# Patient Record
Sex: Male | Born: 1960
Health system: Southern US, Community
[De-identification: ages and names within clinical notes are randomized; demographics above are authoritative.]

## PROBLEM LIST (undated history)

## (undated) DIAGNOSIS — I10 Essential (primary) hypertension: Secondary | ICD-10-CM

## (undated) DIAGNOSIS — E119 Type 2 diabetes mellitus without complications: Secondary | ICD-10-CM

## (undated) DIAGNOSIS — E785 Hyperlipidemia, unspecified: Secondary | ICD-10-CM

## (undated) HISTORY — DX: Type 2 diabetes mellitus without complications: E11.9

## (undated) HISTORY — DX: Essential (primary) hypertension: I10

## (undated) HISTORY — DX: Hyperlipidemia, unspecified: E78.5

## (undated) HISTORY — PX: CIRCUMCISION: SUR203

---

## 2011-07-29 ENCOUNTER — Ambulatory Visit: Payer: Self-pay | Admitting: Physician Assistant

## 2011-07-29 VITALS — BP 144/80 | HR 69 | Temp 97.9°F | Resp 16 | Ht 62.0 in | Wt 153.0 lb

## 2011-07-29 DIAGNOSIS — E119 Type 2 diabetes mellitus without complications: Secondary | ICD-10-CM

## 2011-07-29 DIAGNOSIS — H66019 Acute suppurative otitis media with spontaneous rupture of ear drum, unspecified ear: Secondary | ICD-10-CM

## 2011-07-29 LAB — GLUCOSE, POCT (MANUAL RESULT ENTRY): POC Glucose: 101

## 2011-07-29 MED ORDER — OFLOXACIN 0.3 % OT SOLN: 10.0000 [drp] | Freq: Every day | OTIC | Status: AC

## 2011-07-29 MED ORDER — AMOXICILLIN 500 MG PO CAPS: 500.0000 mg | ORAL_CAPSULE | Freq: Three times a day (TID) | ORAL | Status: AC

## 2011-07-29 NOTE — Progress Notes (Signed)
  Subjective:    Patient ID: Alexander Parker, male    DOB: Aug 05, 1960, 51 y.o.   MRN: [id number]  HPI  51 yo hispanic male c/o blood coming out of the Left ear.  Started out with mild pain after flying in January.  Pain has been on and off til yesterday.  No URI s/sx.  Compliant with diabetes meds.  Review of Systems  All other systems reviewed and are negative.       Objective:   Physical Exam  Constitutional: He is oriented to person, place, and time. He appears well-developed and well-nourished.  HENT:  Head: Normocephalic and atraumatic.  Right Ear: External ear normal.  Mouth/Throat: Oropharynx is clear and moist. No oropharyngeal exudate.       L TM with rupture and erythema, dried blood in canal  Neck: Normal range of motion.  Cardiovascular: Normal rate, regular rhythm and normal heart sounds.   Pulmonary/Chest: Effort normal and breath sounds normal.  Neurological: He is alert and oriented to person, place, and time.  Skin: Skin is warm and dry.    Results for orders placed in visit on 07/29/11  GLUCOSE, POCT (MANUAL RESULT ENTRY)      Component Value Range   POC Glucose 101           Assessment & Plan:  L AOM with TM perforation.  Refer to ENT.  TM may need patching.  Start Floxin Otic and Amoxicillin.  Diabetes-controlled.  Continue current meds

## 2011-07-31 ENCOUNTER — Telehealth: Payer: Self-pay

## 2011-07-31 NOTE — Telephone Encounter (Signed)
WAS GIVEN AMOXICILLIAN AND IT IS MAKING HIM VOMIT PLEASE CALL

## 2011-08-01 NOTE — Telephone Encounter (Signed)
Spoke with patient, he states that the Amox is making him throw up.  Patient is taking med on a full stomach.  Can we recommend another rx to help?  Pls advise.

## 2011-08-01 NOTE — Telephone Encounter (Signed)
Called patient and spoke to him and an interpreter.  He is feeling better but vomited yesterday, also has some dizziness.  Language barrier present.  Advised to continue Floxin drops and stop Amoxicillin if he cannot tolerate it BID.  Pt understands.  Will RTC if dizziness persists another 2-3 days.

## 2011-08-02 ENCOUNTER — Ambulatory Visit: Payer: Self-pay | Admitting: Physician Assistant

## 2011-08-02 VITALS — BP 122/77 | HR 66 | Temp 97.7°F | Resp 18 | Ht 63.0 in | Wt 155.0 lb

## 2011-08-02 DIAGNOSIS — H66009 Acute suppurative otitis media without spontaneous rupture of ear drum, unspecified ear: Secondary | ICD-10-CM

## 2011-08-02 DIAGNOSIS — H66019 Acute suppurative otitis media with spontaneous rupture of ear drum, unspecified ear: Secondary | ICD-10-CM

## 2011-08-02 MED ORDER — AZITHROMYCIN 250 MG PO TABS: ORAL_TABLET | ORAL | Status: AC

## 2011-08-02 NOTE — Progress Notes (Signed)
  Subjective:    Patient ID: Markese Bloxham, male    DOB: 09-02-1960, 51 y.o.   MRN: [id number]  HPI  See previous OV   Mr. Brallier is here for recheck left TM perf/OM. He states he took 2 or 3 doses of Amoxicillin and noticed that his tongue only turned white and vomited 1 time only.  Never with a rash, itching or swelling. Does not recall ever taking amoxicillin in the past. He states his ear is improving a little.  He is using Floxin otic still. No hearing loss   Review of Systems  Constitutional: Negative for fever and chills.  HENT: Positive for ear pain and ear discharge (blood). Negative for hearing loss and tinnitus.   Gastrointestinal: Positive for nausea and vomiting.  Skin: Negative for rash.  Neurological: Positive for headaches.       Objective:   Physical Exam  HENT:  Right Ear: Hearing, tympanic membrane, external ear and ear canal normal.  Left Ear: There is drainage (dried blood in canal). No swelling or tenderness.  No middle ear effusion. No hemotympanum. Decreased hearing is noted.  Mouth/Throat: Uvula is midline and oropharynx is clear and moist. No uvula swelling.  Skin: Skin is warm. No rash noted.          Assessment & Plan:  OM with perf per last ov although seems that TM is better Nausea/vomiting from medicaiton Tongue changes  D/C Amoxicillin Change to Zpack Continue Floxin Otic Has ENT referral pending

## 2011-12-23 ENCOUNTER — Ambulatory Visit: Payer: Self-pay | Admitting: Internal Medicine

## 2011-12-23 VITALS — BP 107/69 | HR 65 | Temp 98.0°F | Resp 14 | Ht 63.0 in | Wt 156.2 lb

## 2011-12-23 DIAGNOSIS — E119 Type 2 diabetes mellitus without complications: Secondary | ICD-10-CM

## 2011-12-23 DIAGNOSIS — E785 Hyperlipidemia, unspecified: Secondary | ICD-10-CM

## 2011-12-23 DIAGNOSIS — I10 Essential (primary) hypertension: Secondary | ICD-10-CM | POA: Insufficient documentation

## 2011-12-23 LAB — COMPREHENSIVE METABOLIC PANEL
AST: 14 U/L (ref 0–37)
Albumin: 4.2 g/dL (ref 3.5–5.2)
Alkaline Phosphatase: 62 U/L (ref 39–117)
BUN: 14 mg/dL (ref 6–23)
Calcium: 9.6 mg/dL (ref 8.4–10.5)
Chloride: 100 mEq/L (ref 96–112)
Glucose, Bld: 133 mg/dL — ABNORMAL HIGH (ref 70–99)
Potassium: 4.5 mEq/L (ref 3.5–5.3)
Sodium: 135 mEq/L (ref 135–145)
Total Protein: 6.7 g/dL (ref 6.0–8.3)

## 2011-12-23 LAB — LIPID PANEL: Cholesterol: 211 mg/dL — ABNORMAL HIGH (ref 0–200)

## 2011-12-23 LAB — POCT GLYCOSYLATED HEMOGLOBIN (HGB A1C): Hemoglobin A1C: 6.8

## 2011-12-23 MED ORDER — GLIPIZIDE-METFORMIN HCL 2.5-500 MG PO TABS: 1.0000 | ORAL_TABLET | Freq: Two times a day (BID) | ORAL | Status: DC

## 2011-12-23 MED ORDER — LISINOPRIL-HYDROCHLOROTHIAZIDE 10-12.5 MG PO TABS: 1.0000 | ORAL_TABLET | Freq: Every day | ORAL | Status: DC

## 2011-12-23 NOTE — Progress Notes (Addendum)
  Subjective:    Patient ID: Alexander Parker, male    DOB: [birth year], 51 y.o.   MRN: [id number]  HPI Here for followup of diabetes and hypertension Medication was changed 4 months ago because of elevated hemoglobin A1c He has no complaints   Review of Systems No fatigue or weight loss No fever or night sweats No chest pain/shortness of breath No peripheral numbness    Objective:   Physical Exam Vital signs normal except weight HEENT clear Heart regular without murmur Lower extremities with no numbness or edema       Results for orders placed in visit on 12/23/11  POCT GLYCOSYLATED HEMOGLOBIN (HGB A1C)      Component Value Range   Hemoglobin A1C 6.8      Assessment & Plan:  Problem #1 diabetes Problem #2 hypertension Problem #3 overweight At labs in 2010 was hyperlipidemic and we'll recheck this today Meds ordered this encounter  Medications   LOSE weight             . glipiZIDE-metformin (METAGLIP) 2.5-500 MG per tablet    Sig: Take 1 tablet by mouth 2 (two) times daily before a meal.    Dispense:  180 tablet    Refill:  1  . lisinopril-hydrochlorothiazide (PRINZIDE,ZESTORETIC) 10-12.5 MG per tablet    Sig: Take 1 tablet by mouth daily.    Dispense:  90 tablet    Refill:  1   Addendum 12/24/2011: LDL in the 140s Start Pravachol 20/check labs 3 months

## 2011-12-24 ENCOUNTER — Encounter: Payer: Self-pay | Admitting: Internal Medicine

## 2011-12-24 MED ORDER — PRAVASTATIN SODIUM 20 MG PO TABS: 20.0000 mg | ORAL_TABLET | Freq: Every day | ORAL | Status: DC

## 2011-12-24 NOTE — Addendum Note (Signed)
Addended by: Tonye Pearson on: 12/24/2011 11:01 PM   Modules accepted: Orders

## 2012-09-03 ENCOUNTER — Ambulatory Visit: Payer: Self-pay | Admitting: Internal Medicine

## 2012-09-03 VITALS — BP 114/68 | HR 88 | Temp 98.4°F | Resp 16 | Ht 62.0 in | Wt 151.8 lb

## 2012-09-03 DIAGNOSIS — E1165 Type 2 diabetes mellitus with hyperglycemia: Secondary | ICD-10-CM

## 2012-09-03 DIAGNOSIS — Z760 Encounter for issue of repeat prescription: Secondary | ICD-10-CM

## 2012-09-03 DIAGNOSIS — J029 Acute pharyngitis, unspecified: Secondary | ICD-10-CM

## 2012-09-03 DIAGNOSIS — E785 Hyperlipidemia, unspecified: Secondary | ICD-10-CM

## 2012-09-03 DIAGNOSIS — E118 Type 2 diabetes mellitus with unspecified complications: Secondary | ICD-10-CM

## 2012-09-03 LAB — POCT CBC
Granulocyte percent: 69.5 %G (ref 37–80)
HCT, POC: 49.8 % (ref 43.5–53.7)
MCH, POC: 28.8 pg (ref 27–31.2)
MCV: 89.1 fL (ref 80–97)
RBC: 5.59 M/uL (ref 4.69–6.13)
WBC: 6.8 10*3/uL (ref 4.6–10.2)

## 2012-09-03 LAB — POCT GLYCOSYLATED HEMOGLOBIN (HGB A1C): Hemoglobin A1C: 7.9

## 2012-09-03 MED ORDER — PRAVASTATIN SODIUM 20 MG PO TABS: 20.0000 mg | ORAL_TABLET | Freq: Every day | ORAL | Status: DC

## 2012-09-03 MED ORDER — GLIPIZIDE-METFORMIN HCL 2.5-500 MG PO TABS: 1.0000 | ORAL_TABLET | Freq: Two times a day (BID) | ORAL | Status: DC

## 2012-09-03 NOTE — Patient Instructions (Signed)
Take your meds as directed. Follow the diabetic diet and exercise daily. Your diabetes is not well controlled and your glucose is 288 and your hgb a1c is 7.9. This need to come down to 87 and your sugar needs to be down to 120. Return to the office at 104 make an appointment for in one month to check on your diabetes control.Marland Kitchenany problems return to the office.

## 2012-09-03 NOTE — Progress Notes (Signed)
  Subjective:    Patient ID: Alexander Parker, male    DOB: [birth year], 52 y.o.   MRN: [id number]  HPI Pt ran out of his diabetes meds 4 days ago. Feels tired. Is waking up 2 times nightly to urinate. Also is complaining of a sore throat for the past month. Taking advil for pain. No stiff neck no rash no headache.no weight lossor weight gain.  Review of Systems  Constitutional: Positive for fatigue.  Eyes: Negative.   Respiratory: Negative.   Cardiovascular: Negative.   Gastrointestinal: Negative.   Endocrine: Positive for polyuria.  Genitourinary: Positive for dysuria.  Allergic/Immunologic: Negative.  Negative for immunocompromised state.  Neurological: Negative.   Hematological: Negative.   Psychiatric/Behavioral: Negative.   All other systems reviewed and are negative.       Objective:   Physical Exam  Nursing note and vitals reviewed. Constitutional: He is oriented to person, place, and time. He appears well-developed and well-nourished.  HENT:  Head: Normocephalic and atraumatic.  Right Ear: External ear normal.  Left Ear: External ear normal.  Nose: Nose normal.  Mouth/Throat: Oropharynx is clear and moist.  Eyes: Conjunctivae and EOM are normal. Pupils are equal, round, and reactive to light.  Neck: Neck supple.  Cardiovascular: Normal rate, regular rhythm, normal heart sounds and intact distal pulses.   Pulmonary/Chest: Effort normal and breath sounds normal.  Abdominal: Soft. Bowel sounds are normal.  Musculoskeletal: Normal range of motion.  Neurological: He is alert and oriented to person, place, and time. He has normal reflexes.  Skin: Skin is warm and dry.  Psychiatric: He has a normal mood and affect. His behavior is normal. Judgment and thought content normal.    Results for orders placed in visit on 09/03/12  POCT CBC      Result Value Range   WBC 6.8  4.6 - 10.2 K/uL   Lymph, poc 1.6  0.6 - 3.4   POC LYMPH PERCENT 23.7  10 - 50 %L   MID (cbc) 0.5  0 -  0.9   POC MID % 6.8  0 - 12 %M   POC Granulocyte 4.7  2 - 6.9   Granulocyte percent 69.5  37 - 80 %G   RBC 5.59  4.69 - 6.13 M/uL   Hemoglobin 16.1  14.1 - 18.1 g/dL   HCT, POC 56.2  13.0 - 53.7 %   MCV 89.1  80 - 97 fL   MCH, POC 28.8  27 - 31.2 pg   MCHC 32.3  31.8 - 35.4 g/dL   RDW, POC 86.5     Platelet Count, POC 332  142 - 424 K/uL   MPV 10.1  0 - 99.8 fL  GLUCOSE, POCT (MANUAL RESULT ENTRY)      Result Value Range   POC Glucose 288 (*) 70 - 99 mg/dl  POCT GLYCOSYLATED HEMOGLOBIN (HGB A1C)      Result Value Range   Hemoglobin A1C 7.9          Assessment & Plan:  Hemoglobin a1c is high at 7.9 and glucose is high 288. Pt has not been taking his medication for several days. Will encourage dietary compliance exercise and wiill restat meds at his usual dose.the patient needs to make an appointment to be followed at 104 to  Follow for ppossible meds change and better diabetes control.

## 2013-03-08 ENCOUNTER — Ambulatory Visit: Payer: Self-pay | Admitting: Emergency Medicine

## 2013-03-08 VITALS — BP 112/68 | HR 73 | Temp 98.2°F | Resp 16 | Ht 62.0 in | Wt 155.0 lb

## 2013-03-08 DIAGNOSIS — E119 Type 2 diabetes mellitus without complications: Secondary | ICD-10-CM

## 2013-03-08 DIAGNOSIS — E785 Hyperlipidemia, unspecified: Secondary | ICD-10-CM

## 2013-03-08 DIAGNOSIS — I10 Essential (primary) hypertension: Secondary | ICD-10-CM

## 2013-03-08 LAB — COMPREHENSIVE METABOLIC PANEL
ALT: 21 U/L (ref 0–53)
AST: 18 U/L (ref 0–37)
Albumin: 4.1 g/dL (ref 3.5–5.2)
Alkaline Phosphatase: 53 U/L (ref 39–117)
Chloride: 99 mEq/L (ref 96–112)
Potassium: 4.5 mEq/L (ref 3.5–5.3)
Sodium: 135 mEq/L (ref 135–145)
Total Protein: 6.9 g/dL (ref 6.0–8.3)

## 2013-03-08 LAB — LIPID PANEL
Cholesterol: 166 mg/dL (ref 0–200)
HDL: 37 mg/dL — ABNORMAL LOW (ref >39)
Total CHOL/HDL Ratio: 4.5 Ratio
VLDL: 20 mg/dL (ref 0–40)

## 2013-03-08 LAB — POCT GLYCOSYLATED HEMOGLOBIN (HGB A1C): Hemoglobin A1C: 6.1

## 2013-03-08 LAB — GLUCOSE, POCT (MANUAL RESULT ENTRY): POC Glucose: 119 mg/dl — AB (ref 70–99)

## 2013-03-08 MED ORDER — LISINOPRIL-HYDROCHLOROTHIAZIDE 10-12.5 MG PO TABS: 1.0000 | ORAL_TABLET | Freq: Every day | ORAL | Status: DC

## 2013-03-08 MED ORDER — GLIPIZIDE-METFORMIN HCL 2.5-500 MG PO TABS: 1.0000 | ORAL_TABLET | Freq: Two times a day (BID) | ORAL | Status: DC

## 2013-03-08 MED ORDER — PRAVASTATIN SODIUM 20 MG PO TABS: 20.0000 mg | ORAL_TABLET | Freq: Every day | ORAL | Status: DC

## 2013-03-08 NOTE — Progress Notes (Addendum)
Subjective:  This chart was scribed for Collene Gobble, MD by Arlan Organ, ED Scribe. This patient was seen in room Room  9 and the patient's care was started 10:50 AM.   Patient ID: Alexander Parker, male    DOB: [birth year], 52 y.o.   MRN: [id number]  HPI HPI Comments: Alexander Parker is a 52 y.o. male who presents to [facility] seeking a DM check up today. Pt states he has had DM for 10 years. Pt states he feels he is currently in good health, and does not have any medical complaints. Pt denies being seen by an optometrist for DM progression in his eyes. 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  Review of Systems  Respiratory: Negative.   Cardiovascular: Negative.   Neurological: Negative.     Past Medical History  Diagnosis Date  . Diabetes mellitus without complication     History   Social History  . Marital Status: Married    Spouse Name: N/A    Number of Children: N/A  . Years of Education: N/A   Occupational History  . Not on file.   Social History Main Topics  . Smoking status: Never Smoker   . Smokeless tobacco: Not on file  . Alcohol Use: Not on file  . Drug Use: Not on file  . Sexual Activity: Not on file   Other Topics Concern  . Not on file   Social History Narrative  . No narrative on file    History reviewed. No pertinent past surgical history.   Results for orders placed in visit on 09/03/12  POCT CBC      Result Value Range   WBC 6.8  4.6 - 10.2 K/uL   Lymph, poc 1.6  0.6 - 3.4   POC LYMPH PERCENT 23.7  10 - 50 %L   MID (cbc) 0.5  0 - 0.9   POC MID % 6.8  0 - 12 %M   POC Granulocyte 4.7  2 - 6.9   Granulocyte percent 69.5  37 - 80 %G   RBC 5.59  4.69 - 6.13 M/uL   Hemoglobin 16.1  14.1 - 18.1 g/dL   HCT, POC 40.9  81.1 - 53.7 %   MCV 89.1  80 - 97 fL   MCH, POC 28.8  27 - 31.2 pg   MCHC 32.3  31.8 - 35.4 g/dL   RDW, POC 91.4     Platelet Count, POC 332  142 - 424 K/uL   MPV 10.1  0 - 99.8 fL  GLUCOSE, POCT (MANUAL RESULT ENTRY)      Result Value Range   POC  Glucose 288 (*) 70 - 99 mg/dl  POCT GLYCOSYLATED HEMOGLOBIN (HGB A1C)      Result Value Range   Hemoglobin A1C 7.9         Objective:   Physical Exam  Constitutional: He is oriented to person, place, and time. He appears well-developed and well-nourished. No distress.  HENT:  Head: Normocephalic.  Eyes: Conjunctivae are normal. Pupils are equal, round, and reactive to light. No scleral icterus.  Neck: Normal range of motion. Neck supple. No thyromegaly present.  Cardiovascular: Normal rate and regular rhythm.  Exam reveals no gallop and no friction rub.   No murmur heard. Pulmonary/Chest: Effort normal and breath sounds normal. No respiratory distress. He has no wheezes. He has no rales.  Abdominal: Soft. Bowel sounds are normal. He exhibits no distension. There is no tenderness. There is no rebound.  Musculoskeletal:  Normal range of motion.  Neurological: He is alert and oriented to person, place, and time.  Skin: Skin is warm and dry. No rash noted.  Psychiatric: He has a normal mood and affect. His behavior is normal.   Results for orders placed in visit on 03/08/13  GLUCOSE, POCT (MANUAL RESULT ENTRY)      Result Value Range   POC Glucose 119 (*) 70 - 99 mg/dl  POCT GLYCOSYLATED HEMOGLOBIN (HGB A1C)      Result Value Range   Hemoglobin A1C 6.1           Assessment & Plan:  Patient showing excellent blood sugar control. His blood pressure is at goal. He had a normal filament test. He he was advised to see his eye doctor for routine check

## 2013-03-08 NOTE — Patient Instructions (Signed)
Please return to clinic for a reevaluation in 4 months. Please make an appointment to see your eye doctor. No change in your medications at the present time.

## 2013-09-25 ENCOUNTER — Ambulatory Visit (INDEPENDENT_AMBULATORY_CARE_PROVIDER_SITE_OTHER): Payer: BC Managed Care – PPO | Admitting: Emergency Medicine

## 2013-09-25 VITALS — BP 106/70 | HR 73 | Temp 97.9°F | Resp 16 | Ht 61.5 in | Wt 155.0 lb

## 2013-09-25 DIAGNOSIS — L255 Unspecified contact dermatitis due to plants, except food: Secondary | ICD-10-CM

## 2013-09-25 MED ORDER — TRIAMCINOLONE ACETONIDE 0.1 % EX CREA: 1.0000 "application " | TOPICAL_CREAM | Freq: Two times a day (BID) | CUTANEOUS | Status: DC

## 2013-09-25 MED ORDER — METHYLPREDNISOLONE ACETATE 80 MG/ML IJ SUSP
120.0000 mg | Freq: Once | INTRAMUSCULAR | Status: AC
  Administered 2013-09-25: 120 mg via INTRAMUSCULAR

## 2013-09-25 NOTE — Progress Notes (Signed)
[facility] [address] [place] [address] [phone]  Date:  09/25/2013   Name:  Alexander Parker   DOB:  October 29, 1960   MRN:  [id number]  PCP:  Kennon Portela, MD    Chief Complaint: Poison Ivy   History of Present Illness:  Alexander Parker is a 53 y.o. 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  Has rash on both forearms flexor surfaces.  No other areas.  No improvement with over the counter medications or other home remedies. Denies other complaint or health concern today.   Patient Active Problem List   Diagnosis Date Noted  . DM (diabetes mellitus) 12/23/2011  . HTN (hypertension) 12/23/2011  . Diabetes mellitus 07/29/2011    Past Medical History  Diagnosis Date  . Diabetes mellitus without complication     History reviewed. No pertinent past surgical history.  History  Substance Use Topics  . Smoking status: Never Smoker   . Smokeless tobacco: Not on file  . Alcohol Use: Not on file    Family History  Problem Relation Age of Onset  . Cancer Mother   . Diabetes Father     No Known Allergies  Medication list has been reviewed and updated.  Current Outpatient Prescriptions on File Prior to Visit  Medication Sig Dispense Refill  . glipiZIDE-metformin (METAGLIP) 2.5-500 MG per tablet Take 1 tablet by mouth 2 (two) times daily before a meal.  180 tablet  3  . lisinopril-hydrochlorothiazide (PRINZIDE,ZESTORETIC) 10-12.5 MG per tablet Take 1 tablet by mouth daily.  90 tablet  3  . pravastatin (PRAVACHOL) 20 MG tablet Take 1 tablet (20 mg total) by mouth daily.  90 tablet  3   No current facility-administered medications on file prior to visit.    Review of Systems:  As per HPI, otherwise negative.    Physical Examination: Filed Vitals:   09/25/13 1835  BP: 106/70  Pulse: 73  Temp: 97.9 F (36.6 C)  Resp: 16   Filed Vitals:   09/25/13 1835  Height: 5'  1.5" (1.562 m)  Weight: 155 lb (70.308 kg)   Body mass index is 28.82 kg/(m^2). Ideal Body Weight: Weight in (lb) to have BMI = 25: 134.2   GEN: WDWN, NAD, Non-toxic, Alert & Oriented x 3 HEENT: Atraumatic, Normocephalic.  Ears and Nose: No external deformity. EXTR: No clubbing/cyanosis/edema NEURO: Normal gait.  PSYCH: Normally interactive. Conversant. Not depressed or anxious appearing.  Calm demeanor.  Skin:  Rhus dermatitis arms   Assessment and Plan: Poison ivy TAC Benadryl   Signed,  Ellison Carwin, MD

## 2013-09-25 NOTE — Patient Instructions (Signed)
Hiedra venenosa  (Poison Ivy) Luego de la exposicin previa a la planta. La erupcin suele aparecer 48 horas despus de la exposicin. Suelen ser bultos (ppulas) o ampollas (vesculas) en un patrn lineal. abrirse. Los ojos tambin podran hincharse. Las hinchazn es peor por la maana y mejora a medida que avanza el da. Deben tomarse todas las precauciones para prevenir una infeccin bacteriana (por grmenes) secundaria, que puede ocasionar cicatrices. Mantenga todas las reas abiertas secas, limpias y vendadas y cbralas con un ungento antibacteriano, en caso que lo necesite. Si no aparece una infeccin secundaria, esta dermatitis generalmente se cura dentro de las 2 o 3 semanas sin tratamiento. INSTRUCCIONES PARA EL CUIDADO DOMICILIARIO Lvese cuidadosamente con agua y jabn tan pronto como ocurra la exposicin al txico. Tiene alrededor de media hora para retirar la resina de la planta antes de que le cause el sarpullido. El lavado destruir rpidamente el aceite o antgeno que se encuentra sobre la piel y que podr causar el sarpullido. Lave enrgicamente debajo de las uas. Todo resto de resina seguir diseminando el sarpullido. No se frote la piel vigorosamente cuando lava la zona afectada. La dermatitis no se extender si retira todo el aceite de la planta que haya quedado en su cuerpo. Un sarpullido que se ha transformado en lesiones que supuran (llagas) no diseminar el sarpullido, a menos que no se haya lavado cuidadosamente. Tambin es importante lavar todas las prendas que haya utilizado. Pueden tener alrgenos activos. El sarpullido volver, an varios das ms tarde. La mejor medida es evitar el contacto con la planta en el futuro. La hiedra venenosa puede reconocerse por el nmero de hojas, En general, la hiedra venenosa tiene tres hojas con ramas floridas en un tallo simple. Podr adquirir difenhidramina que es un medicamento de venta libre, y utilizarlo segn lo necesite para aliviar la  picazn. No conduzca automviles si este medicamento le produce somnolencia. Consulte con el profesional que lo asiste acerca de los medicamentos que podr administrarle a los nios. SOLICITE ATENCIN MDICA SI:  Observa reas abiertas.  Enrojecimiento que se extiende ms all de la zona del sarpullido.  Una secrecin purulenta (similar al pus).  Aumento del dolor.  Desarrolla otros signos de infeccin (como fiebre). Document Released: 02/21/2005 Document Revised: 08/06/2011 [employer] Patient Information 2014 [employer].  

## 2013-09-26 NOTE — Addendum Note (Signed)
Addended by: Constance Goltz on: 09/26/2013 07:18 AM   Modules accepted: Orders

## 2014-01-20 ENCOUNTER — Telehealth: Payer: Self-pay | Admitting: *Deleted

## 2014-01-20 NOTE — Telephone Encounter (Signed)
Spoke to patient he will come to the walk in clinic this week for follow up on diabetes.

## 2014-01-23 ENCOUNTER — Ambulatory Visit (INDEPENDENT_AMBULATORY_CARE_PROVIDER_SITE_OTHER): Payer: BC Managed Care – PPO | Admitting: Internal Medicine

## 2014-01-23 VITALS — BP 118/72 | HR 67 | Temp 97.7°F | Resp 14 | Ht 62.0 in | Wt 151.4 lb

## 2014-01-23 DIAGNOSIS — Z7189 Other specified counseling: Secondary | ICD-10-CM

## 2014-01-23 DIAGNOSIS — E1110 Type 2 diabetes mellitus with ketoacidosis without coma: Secondary | ICD-10-CM

## 2014-01-23 DIAGNOSIS — R350 Frequency of micturition: Secondary | ICD-10-CM

## 2014-01-23 DIAGNOSIS — E1310 Other specified diabetes mellitus with ketoacidosis without coma: Secondary | ICD-10-CM

## 2014-01-23 LAB — COMPREHENSIVE METABOLIC PANEL
ALT: 27 U/L (ref 0–53)
AST: 20 U/L (ref 0–37)
Albumin: 4.3 g/dL (ref 3.5–5.2)
Alkaline Phosphatase: 56 U/L (ref 39–117)
BILIRUBIN TOTAL: 0.5 mg/dL (ref 0.2–1.2)
BUN: 17 mg/dL (ref 6–23)
CO2: 32 mEq/L (ref 19–32)
CREATININE: 0.69 mg/dL (ref 0.50–1.35)
Calcium: 9.6 mg/dL (ref 8.4–10.5)
Chloride: 99 mEq/L (ref 96–112)
Glucose, Bld: 135 mg/dL — ABNORMAL HIGH (ref 70–99)
Potassium: 4.5 mEq/L (ref 3.5–5.3)
Sodium: 135 mEq/L (ref 135–145)
Total Protein: 7.1 g/dL (ref 6.0–8.3)

## 2014-01-23 LAB — POCT UA - MICROSCOPIC ONLY
Bacteria, U Microscopic: NEGATIVE
Casts, Ur, LPF, POC: NEGATIVE
Crystals, Ur, HPF, POC: NEGATIVE
Mucus, UA: NEGATIVE
YEAST UA: NEGATIVE

## 2014-01-23 LAB — POCT URINALYSIS DIPSTICK
BILIRUBIN UA: NEGATIVE
Blood, UA: NEGATIVE
Glucose, UA: NEGATIVE
Ketones, UA: NEGATIVE
Leukocytes, UA: NEGATIVE
NITRITE UA: NEGATIVE
PH UA: 5.5
PROTEIN UA: 30
Spec Grav, UA: 1.025
Urobilinogen, UA: 1

## 2014-01-23 LAB — POCT GLYCOSYLATED HEMOGLOBIN (HGB A1C): HEMOGLOBIN A1C: 6.7

## 2014-01-23 LAB — GLUCOSE, POCT (MANUAL RESULT ENTRY): POC Glucose: 133 mg/dl — AB (ref 70–99)

## 2014-01-23 MED ORDER — LISINOPRIL 10 MG PO TABS: 10.0000 mg | ORAL_TABLET | Freq: Every day | ORAL | Status: DC

## 2014-01-23 NOTE — Progress Notes (Signed)
   Subjective:    Patient ID: Alexander Parker, male    DOB: January 09, 1961, 53 y.o.   MRN: [id number]  HPI Speaks only spanish, interpretor our Verdis Frederickson. Here to f/up on T2DM. Taking and tolerating all meds. Does not need refills yet. Does have frequency but only after taking am meds. HCTZ in one of them. BP always well controlled at home.   Review of Systems Needs hispanic provider speaker.    Objective:   Physical Exam  Constitutional: He is oriented to person, place, and time. He appears well-developed and well-nourished. No distress.  HENT:  Head: Normocephalic.  Mouth/Throat: Oropharynx is clear and moist.  Eyes: Conjunctivae and EOM are normal. Pupils are equal, round, and reactive to light. No scleral icterus.  Neck: Normal range of motion. Neck supple. No tracheal deviation present.  Cardiovascular: Normal rate, regular rhythm and normal heart sounds.   Pulmonary/Chest: Effort normal and breath sounds normal.  Abdominal: Soft. Bowel sounds are normal. He exhibits no distension. There is no tenderness. There is no rebound and no guarding.  Musculoskeletal: Normal range of motion.  Lymphadenopathy:    He has no cervical adenopathy.  Neurological: He is alert and oriented to person, place, and time. He has normal reflexes. No cranial nerve deficit. He exhibits normal muscle tone. Coordination normal.  Psychiatric: He has a normal mood and affect. His behavior is normal. Judgment and thought content normal.   Sensation intact  Results for orders placed in visit on 01/23/14  GLUCOSE, POCT (MANUAL RESULT ENTRY)      Result Value Ref Range   POC Glucose 133 (*) 70 - 99 mg/dl  POCT GLYCOSYLATED HEMOGLOBIN (HGB A1C)      Result Value Ref Range   Hemoglobin A1C 6.7    POCT UA - MICROSCOPIC ONLY      Result Value Ref Range   WBC, Ur, HPF, POC 0-1     RBC, urine, microscopic 0-2     Bacteria, U Microscopic neg     Mucus, UA neg     Epithelial cells, urine per micros 0-1     Crystals,  Ur, HPF, POC neg     Casts, Ur, LPF, POC neg     Yeast, UA neg    POCT URINALYSIS DIPSTICK      Result Value Ref Range   Color, UA amber     Clarity, UA clear     Glucose, UA neg     Bilirubin, UA neg     Ketones, UA neg     Spec Grav, UA 1.025     Blood, UA neg     pH, UA 5.5     Protein, UA 30     Urobilinogen, UA 1.0     Nitrite, UA neg     Leukocytes, UA Negative          Assessment & Plan:  DC hctz, use lisinopril 10mg  RF all meds 6 months

## 2014-01-23 NOTE — Patient Instructions (Signed)
Diabetes y normas bsicas de atencin mdica (Diabetes and Standards of Medical Care) La diabetes es una enfermedad complicada. El equipo que trate su diabetes deber incluir un nutricionista, un enfermero, un educador para la diabetes, un oftalmlogo y ms. Para que todas las personas conozcan sobre su enfermedad y para que los pacientes tengan los cuidados que necesitan, se crearon las siguientes normas bsicas para un mejor control. A continuacin se indican los estudios, vacunas, medicamentos, educacin y planes que necesitar. Prueba de HbA1c Esta prueba muestra cmo ha sido controlada su glucosa en los ltimos 2 o 3 meses. Se utiliza para verificar si el plan de control de la diabetes debe ser ajustado.   Hgalos al menos 2 veces al ao si cumple los objetivos del [place].  Si le han cambiado el tratamiento o si no cumple con los objetivos del [place], debe hacerlo 4 veces al ao. Control de la presin arterial.  Hgalas en cada visita mdica de rutina. El objetivo es tener menos de 140/90 mm Hg en la mayora de las personas, pero 130/80 mm Hg en algunos casos. Consulte a su mdico acerca de su objetivo. Examen dental.  Concurra regularmente a las visitas de control con el dentista. Examen ocular.  Si le diagnosticaron diabetes tipo 1 siendo un nio, debe hacerse estudios al llegar a los 10 aos o ms y si ha sufrido de diabetes durante 3 a 5 aos. Se recomienda hacer anualmente los exmenes oculares despus de ese examen inicial.  Si le diagnosticaron diabetes tipo 1 siendo adulto, hgase un examen dentro de los 5 aos del diagnstico y luego una vez por ao.  Si le diagnosticaron diabetes tipo 2, hgase un estudio lo antes posible despus del diagnstico y luego una vez por ao. Examen de los pies  Se har una inspeccin visual en cada visita mdica de rutina. Estos controles observarn si hay cortes, lesiones u otros problemas en los pies.  Una vez por ao debe hacerse un  examen ms intensivo. Este examen incluye la inspeccin visual y Ardelia Mems evaluacin de los pulsos del pie y la sensibilidad.  Contrlese los pies todas las noches para ver si hay cortes, lesiones u otros problemas. Comunquese con su mdico si observa que no se curan. Estudio de la funcin renal (microalbmina en orina)  Debe realizarse una vez por ao.  Diabetes tipo 1: La primera prueba se realiza a los 5 aos despus del diagnstico.  Diabetes tipo2: La primera prueba se realiza en el momento del diagnstico.  La creatinina srica y el ndice de filtracin glomerular estimada (eGFR, por sus siglas en ingls) se realizan una vez por ao para informar el nivel de enfermedad renal crnica, si la hubiera. Perfil lipdico (colesterol, HDL, LDL, triglicridos).  La mayora de las personas lo hacen cada 5 aos.  En relacin al LDL, el objetivo es tener menos de 100 mg/dl. Si tiene alto riesgo, el objetivo es tener menos de 70 mg/dl.  En relacin al HDL, el objetivo es [employer] 40 y 52 mg/dl para los hombres y entre 10 y 37 mg/dl para las mujeres. Un nivel de colesterol HDL de 60 mg/dl o superior da una cierta proteccin contra la enfermedad cardaca.  En relacin a los triglicridos, el objetivo es tener menos de 150 mg/dl. Vacuna contra la gripe, contra el neumococo y contra la hepatitis B  Se recomienda aplicarse la vacuna contra la gripe anualmente.  Se recomienda que las The First American de 65aos con diabetes se apliquen la  vacuna contra la neumona. En algunos casos, pueden administrarse dos inyecciones separadas. Pregntele al mdico si tiene la vacuna contra la neumona al da.  Tambin se recomienda la aplicacin de la vacuna contra la hepatitis B en los adultos con diabetes. Educacin para el autocontrol de la diabetes  Recomendaciones al momento del diagnstico y los controles segn sea necesario. Plan de tratamiento  Su plan de tratamiento ser revisado en cada visita  mdica. Document Released: 08/08/2009 Document Revised: 09/28/2013 [facility] Patient Information 2015 [place]. This information is not intended to replace advice given to you by your health care provider. Make sure you discuss any questions you have with your health care provider. Diabetes y Kandace Blitz fsica (Diabetes and Exercise) Hacer actividad fsica con regularidad es muy importante. No se trata solo de [employer]. Tiene muchos otros beneficios, como por ejemplo:  Mejorar el estado fsico, la flexibilidad y la resistencia.  Aumenta la densidad sea.  Ayuda a Technical sales engineer.  Disminuye la Air traffic controller.  Aumenta la fuerza muscular.  Reduce el estrs y las tensiones.  Mejora el estado de salud general. Las personas diabticas que realizan actividad fsica tienen beneficios adicionales debido al ejercicio:  Reduce el apetito.  El organismo mejora el uso del azcar (glucosa) de la [place].  Ayuda a disminuir o Product/process development scientist.  Disminuye la presin arterial.  Ayuda a disminuir los lpidos en la sangre (colesterol y triglicridos).  El organismo mejora el uso de la insulina porque:  Aumenta la sensibilidad del organismo a la insulina.  Reduce las necesidades de insulina del organismo.  Disminuye el riesgo de enfermedad cardaca por la actividad fsica ya que  disminuye el colesterol y [employer] triglicridos.  Aumenta los niveles de colesterol bueno (como las lipoprotenas de alta densidad [HDL]) en el organismo.  Disminuye los niveles de glucosa en la [place]. SU PLAN DE ACTIVIDAD  Elija una actividad que disfrute y establezca objetivos realistas. Su mdico o educador en diabetes podrn ayudarlo a encontrar una actividad que lo beneficie. Haga ejercicio regularmente como se lo haya indicado el mdico. Esto incluye:  Hacer entrenamiento de [employer] a la semana, como flexiones, sentadillas, levantar peso o usar bandas de  resistencia.  Practicar 162mnutos de ejercicios cardiovasculares cada semana, como caminar, correr o hacer algn deporte.  Mantenerse activo y no permanecer inactivo durante ms de 940mutos seguidos. Los perodos cortos de acSamoaambin son beneficiosos. Tres sesiones de 1027mtos a lo largo del da son tan beneficiosas como una sola sesin de 23m71mos. Estas son algunas ideas para los ejercicios:  Lleve a paseProbation officertilice las escaClinical cytogeneticist ascensor.  Baile su cancin favorita.  Haga los ejercicios de un video de ejercicios.  Haga sus ejercicios favoritos con un aGafferCOMENDACIONES PARA REALIZAR EJERCICIOS CUANDO SE TIENE DIABETES TIPO 1 O TIPO 2   Controle la glucosa en la sangre antes de comenzar. Si el nivel de glucosa en la sangre es de ms de 240 mg/dl, controle las cetonas en la [place]in[place] haga actividad fsica si hay cetonas.  Evite inyectarse insulina en las zonas del cuerpo que ejercitar. Por ejemplo, evite inyectarse insulina en:  Los brazos, si juega al tenis.  Las piernas, si corre.  Lleve un registro de:  Los alimentos que consume antes y despus de haceTEFL teacheros momentos esperables de picos de accin de la insulina.  Los niveles de glucosa en la sangre antes y despus de  hacer ejercicios.  El tipo y cantidad de [place] fsica que Musician.  Revise los registros con su mdico. El mdico lo ayudar a Actor pautas para ajustar la cantidad de alimento y las cantidades de insulina antes y despus de Field seismologist ejercicios.  Si toma insulina o agentes hipoglucemiantes por va oral, observe si hay signos y sntomas de hipoglucemia. Entre los que se incluyen:  Mareos.  Temblores.  Sudoracin.  Escalofros.  Confusin.  Beba gran cantidad de agua mientras hace ejercicios para evitar la deshidratacin o los golpes de Freight forwarder. Durante la actividad fsica se pierde agua corporal que se debe reponer.  Comente con su mdico  antes de comenzar un programa de actividad fsica para verificar que sea seguro para usted. Recuerde, cualquier actividad es mejor que ninguna. Document Released: 06/03/2007 Document Revised: 09/28/2013 [facility] Patient Information 2015 [place], [place]. This information is not intended to replace advice given to you by your health care provider. Make sure you discuss any questions you have with your health care provider.

## 2014-01-25 ENCOUNTER — Encounter: Payer: Self-pay | Admitting: *Deleted

## 2014-04-15 ENCOUNTER — Ambulatory Visit (INDEPENDENT_AMBULATORY_CARE_PROVIDER_SITE_OTHER): Payer: BC Managed Care – PPO | Admitting: Emergency Medicine

## 2014-04-15 VITALS — BP 110/64 | HR 74 | Temp 98.1°F | Resp 16 | Ht 62.0 in | Wt 150.4 lb

## 2014-04-15 DIAGNOSIS — R300 Dysuria: Secondary | ICD-10-CM

## 2014-04-15 DIAGNOSIS — N4830 Priapism, unspecified: Secondary | ICD-10-CM

## 2014-04-15 DIAGNOSIS — Z23 Encounter for immunization: Secondary | ICD-10-CM

## 2014-04-15 LAB — POCT UA - MICROSCOPIC ONLY
Casts, Ur, LPF, POC: NEGATIVE
Crystals, Ur, HPF, POC: NEGATIVE
EPITHELIAL CELLS, URINE PER MICROSCOPY: NEGATIVE
MUCUS UA: NEGATIVE
RBC, URINE, MICROSCOPIC: NEGATIVE
WBC, UR, HPF, POC: NEGATIVE
Yeast, UA: NEGATIVE

## 2014-04-15 LAB — POCT URINALYSIS DIPSTICK
Bilirubin, UA: NEGATIVE
Blood, UA: NEGATIVE
GLUCOSE UA: NEGATIVE
KETONES UA: NEGATIVE
LEUKOCYTES UA: NEGATIVE
Nitrite, UA: NEGATIVE
Protein, UA: NEGATIVE
SPEC GRAV UA: 1.015
Urobilinogen, UA: 0.2
pH, UA: 5.5

## 2014-04-15 MED ORDER — NAPROXEN SODIUM 550 MG PO TABS: 550.0000 mg | ORAL_TABLET | Freq: Two times a day (BID) | ORAL | Status: DC

## 2014-04-15 MED ORDER — CYCLOBENZAPRINE HCL 10 MG PO TABS: 10.0000 mg | ORAL_TABLET | Freq: Three times a day (TID) | ORAL | Status: DC | PRN

## 2014-04-15 NOTE — Progress Notes (Signed)
[facility] [address] [place] [address] [phone]  Date:  04/15/2014   Name:  Alexander Parker   DOB:  1960-11-06   MRN:  [id number]  PCP:  Kennon Portela, MD    Chief Complaint: Flank Pain and Flu Vaccine   History of Present Illness:  Alexander Parker is a 53 y.o. very pleasant male patient who presents with the following:  Requests a flu shot. 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   Non radiating.  No hematuria, fever or chills.  No rash, bruise. Has dysuria and is concerned by something in the manner that his erections have changed has a persistent swelling in the distal penis and glans No improvement with over the counter medications or other home remedies.  Denies other complaint or health concern today.   Patient Active Problem List   Diagnosis Date Noted  . DM (diabetes mellitus) 12/23/2011  . HTN (hypertension) 12/23/2011  . Diabetes mellitus 07/29/2011    Past Medical History  Diagnosis Date  . Diabetes mellitus without complication     History reviewed. No pertinent past surgical history.  History  Substance Use Topics  . Smoking status: Never Smoker   . Smokeless tobacco: Not on file  . Alcohol Use: Not on file    Family History  Problem Relation Age of Onset  . Cancer Mother   . Diabetes Father     No Known Allergies  Medication list has been reviewed and updated.  Current Outpatient Prescriptions on File Prior to Visit  Medication Sig Dispense Refill  . glipiZIDE-metformin (METAGLIP) 2.5-500 MG per tablet Take 1 tablet by mouth 2 (two) times daily before a meal. 180 tablet 3  . lisinopril (PRINIVIL,ZESTRIL) 10 MG tablet Take 1 tablet (10 mg total) by mouth daily. 90 tablet 3  . pravastatin (PRAVACHOL) 20 MG tablet Take 1 tablet (20 mg total) by mouth daily. 90 tablet 3  . triamcinolone cream (KENALOG) 0.1 % Apply 1 application topically 2  (two) times daily. 60 g 1   No current facility-administered medications on file prior to visit.    Review of Systems:  As per HPI, otherwise negative.    Physical Examination: Filed Vitals:   04/15/14 1413  BP: 110/64  Pulse: 74  Temp: 98.1 F (36.7 C)  Resp: 16   Filed Vitals:   04/15/14 1413  Height: 5\' 2"  (1.575 m)  Weight: 150 lb 6.4 oz (68.221 kg)   Body mass index is 27.5 kg/(m^2). Ideal Body Weight: Weight in (lb) to have BMI = 25: 136.4   GEN: WDWN, NAD, Non-toxic, Alert & Oriented x 3 HEENT: Atraumatic, Normocephalic.  Ears and Nose: No external deformity. EXTR: No clubbing/cyanosis/edema NEURO: Normal gait.  PSYCH: Normally interactive. Conversant. Not depressed or anxious appearing.  Calm demeanor.  LEFT flank:  Tender subcostal area.  No ecchymosis ABDO  Soft not tender GENITALIA:  Normal circumcised  No discharge  Assessment and Plan: Muscle strain Deformed penis Flu shot Refer uroloogy  Signed,  Ellison Carwin, MD

## 2014-04-15 NOTE — Patient Instructions (Signed)
Vacuna antigripal (vacuna antigripal, inactivada o recombinante) 2014-2015: lo que debe saber (Influenza Vaccine (Flu Vaccine, Inactivated or Recombinant) 2014-2015: What You Need to Know) 1. Por qu vacunarse? La influenza ("gripe") es una enfermedad contagiosa que se propaga por los [place] en invierno, por lo general entre octubre y [place]. La gripe es causada por los virus de la influenza y se propaga principalmente por la tos, el estornudo y Network engineer. Cualquier persona puede Emergency planning/management officer gripe, Armed forces training and education officer el riesgo es mayor entre los nios. Los sntomas aparecen rpidamente y [place]. Estos pueden ser:  Cristy Hilts o escalofros.  Dolor de Investment banker, operational.  Dolores musculares.  Fatiga.  Tos.  Dolor de [place].  Secrecin o congestin nasal. La gripe puede hacer que algunas personas se enfermen ms que otras. Fiserv se incluyen a los nios pequeos, las The First American de 51 aos, las mujeres embarazadas y las personas con Armed forces training and education officer, como enfermedades cardacas, pulmonares o renales, trastornos del [place], o que tienen un sistema inmunitario debilitado. La vacuna contra la gripe es especialmente importante para estas personas y para todos los que estn en contacto directo con ellas. La gripe tambin puede causar neumona y [place] afecciones existentes. En los nios, puede provocar diarrea y convulsiones. Cada ao, miles de Goldman Sachs [place] debido a la gripe, y muchas ms deben ser hospitalizadas. La vacuna contra la gripe es la mejor proteccin contra la gripe y sus complicaciones. Tambin ayuda a prevenir el contagio de la gripe de Ardelia Mems persona a Theatre manager. 2. Vacunas antigripales inactivadas y recombinantes Recibe una vacuna inyectable contra la gripe que es una vacuna "inactivada" o "recombinante". Estas vacunas no contienen ningn virus de la gripe vivo. Se administran en forma de inyeccin con [place] y se las  conoce como "vacuna antigripal".  Otro tipo de vacuna antigripal de virus vivos, atenuados (debilitados), se aplica en forma de aerosol en las fosas nasales. Esta vacuna se describe en el apartado Declaracin de informacin sobre las vacunas. Se recomienda la aplicacin de la vacuna contra la gripe todos los [place]. Algunos nios de seis meses a ocho aos de edad podran necesitar dos dosis durante un ao. Los virus de la gripe [place] constantemente. Cada ao, la vacuna contra la gripe se actualiza para proteger contra los tres o cuatro virus que tienen ms probabilidades de causar la enfermedad ese ao. Aunque la vacuna no puede prevenir todos los casos de gripe, es la mejor defensa contra la enfermedad.  La vacuna comienza a tener Motorola despus de la vacunacin, y la proteccin dura de unos meses a un ao. Muchas veces se confunden con la gripe algunas enfermedades que no son causadas por el virus de la gripe. La vacuna contra la gripe no previene estas enfermedades. Solo puede prevenir la gripe. Algunas de las vacunas contra la gripe inactivada contienen una cantidad muy pequea de un conservante a base de mercurio llamado timerosal. Algunos estudios han demostrado que el timerosal en las vacunas no es perjudicial, pero se dispone de vacunas contra la gripe que no contienen el conservante. 3. Algunas personas no deben recibir esta vacuna Informe a la persona que le aplica la vacuna:  Si tiene [place] graves, potencialmente mortales. Si alguna vez tuvo una reaccin alrgica potencialmente mortal despus de Ardelia Mems dosis de la vacuna contra la gripe, o tuvo una alergia grave a cualquiera de los componentes de [place], incluida, por ejemplo, Ardelia Mems alergia a la gelatina,  a los antibiticos o a los [place], es posible que se le recomiende no recibir una dosis. La State Farm de los tipos de vacuna contra la gripe, pero no todos, contienen una pequea cantidad de protena de [place].  Si  alguna vez ha sufrido el sndrome de Curator (una enfermedad paralizante grave tambin llamada GBS). Algunas personas con antecedentes de GBS no deben recibir esta vacuna. Debe hablarlo con su mdico.  Si no se siente bien. Por lo general, la vacuna contra la gripe no causa ningn problema si tiene una enfermedad leve, pero le podran aconsejar que espere hasta sentirse mejor. Debe volver cuando se sienta mejor. 4. Riesgos de [place] reaccin a la vacuna Con la vacuna, como con cualquier Halliburton Company, existe la posibilidad de sufrir efectos secundarios. Suelen ser leves y desaparecen por s solos. Problemas que podran ocurrir despus de cualquier vacuna:  Episodios leves de desmayo pueden presentarse despus de cualquier procedimiento mdico, incluso despus de recibir [employer]. Si permanece sentado o recostado durante 15 minutos puede ayudar a Merrill Lynch y las lesiones causadas por las cadas. Informe al mdico si se siente mareado, tiene cambios en la visin o zumbidos en los odos.  Con muy poca frecuencia, despus de Augusto Gamble, puede presentarse dolor intenso en el hombro y [place] reduccin en el rango de movimiento del brazo donde se la aplic.  Las Chief of Staff graves a Augusto Gamble son poco frecuentes; se estima una proporcin de menos de una en un milln de dosis. Si aparecen, generalmente es despus de algunos minutos o algunas horas despus de la aplicacin de la vacuna. Problemas leves despus de recibir la vacuna de la gripe inactivada:  Dolor, enrojecimiento o Estate agent en el que le aplicaron la vacuna.  Ronquera.  Dolor, enrojecimiento o Progress Energy ojos.  Tos.  Cristy Hilts.  Dolores.  Dolor de [place].  Picazn.  Fatiga. Si estos problemas ocurren, en general comienzan poco despus de vacunarse y [place]. Problemas moderados despus de recibir la vacuna contra la gripe inactivada:  Los nios que reciben la vacuna contra la gripe  inactivada y TEFL teacher antineumoccica (PCV13) al mismo tiempo, pueden tener un mayor riesgo de sufrir convulsiones causadas por fiebre. Consulte a su mdico para obtener ms informacin. Informe a su mdico si un nio que est recibiendo la vacuna contra la gripe ha tenido una convulsin. La vacuna contra la gripe inactivada no contiene el virus vivo; por lo tanto, no puede enfermarse por recibir SCANA Corporation. Al igual que con cualquier Halliburton Company, existe una probabilidad remota de que una vacuna cause una lesin grave o la [place]. Se controla permanentemente la seguridad de las vacunas. Para obtener ms informacin, visite: http://www.aguilar.org/ 5. Qu pasa si hay una reaccin grave? A qu signos debo estar atento?  Observe todo lo que le preocupe, como signos de una reaccin alrgica grave, fiebre muy alta o cambios en el comportamiento. Los signos de Nurse, mental health grave pueden incluir ronchas, hinchazn de la cara y la garganta, dificultad para respirar, latidos cardacos acelerados, mareos y debilidad. Pueden comenzar entre unos pocos minutos y algunas horas despus de la vacunacin. Qu debo hacer?  Si cree que se trata de Nurse, mental health grave o de otra emergencia que no puede esperar, llame al 911 o lleve a la persona al hospital ms cercano. Sino, llame a su mdico.  Despus, la reaccin debe informarse al Northrop Grumman de Informacin sobre Efectos Adversos de las [place] (Vaccine Adverse  Event Reporting System, VAERS). El mdico debe presentar este informe, o bien puede hacerlo usted mismo a travs del sitio web de VAERS, en www.vaers.SamedayNews.es, o llamando al [phone]. VAERSno brinda recomendaciones mdicas. 6. [place] Compensacin de Daos por Krupp de Compensacin de Daos por Clinical biochemist (National Vaccine Injury Compensation Program, VICP) es un programa federal que fue creado para Patent examiner a las personas que puedan haber sufrido daos  al recibir ciertas vacunas. Aquellas personas que consideren que han sufrido un dao como consecuencia de una vacuna y quieren saber ms acerca del programa y cmo presentar Ra[place]hel [place], pueden llamar al [phone] o visitar su sitio web en GoldCloset.com.ee. Hay un lmite de tiempo para presentar un reclamo de compensacin. 7. Cmo puedo obtener ms informacin?  Pregntele a su mdico  Comunquese con el servicio de salud de su localidad o su estado.  Comunquese con los Centros para Building surveyor y la Prevencin de Probation officer for Disease Control and Prevention , CDC).  Llame al [phone] (1-800-CDC-INFO), o  visite la pgina web de CDC en https://gibson.com/. Declaracin de informacin sobre la vacuna (provisional) de los CDC Vacuna antigripal inactivada (01/13/2013) Document Released: 08/10/2008 Document Revised: 09/28/2013 ExitCare Patient Information 2015 [place]. This information is not intended to replace advice given to you by your health care provider. Make sure you discuss any questions you have with your health care provider.

## 2014-04-16 LAB — GC/CHLAMYDIA PROBE AMP
CT Probe RNA: NEGATIVE
GC Probe RNA: NEGATIVE

## 2014-06-03 ENCOUNTER — Other Ambulatory Visit: Payer: Self-pay | Admitting: Emergency Medicine

## 2014-09-14 ENCOUNTER — Ambulatory Visit (INDEPENDENT_AMBULATORY_CARE_PROVIDER_SITE_OTHER): Payer: 59 | Admitting: Internal Medicine

## 2014-09-14 VITALS — BP 148/90 | HR 72 | Temp 98.1°F | Resp 16 | Ht 61.75 in | Wt 153.4 lb

## 2014-09-14 DIAGNOSIS — Z7189 Other specified counseling: Secondary | ICD-10-CM

## 2014-09-14 DIAGNOSIS — E785 Hyperlipidemia, unspecified: Secondary | ICD-10-CM

## 2014-09-14 DIAGNOSIS — I10 Essential (primary) hypertension: Secondary | ICD-10-CM | POA: Diagnosis not present

## 2014-09-14 DIAGNOSIS — Z1211 Encounter for screening for malignant neoplasm of colon: Secondary | ICD-10-CM | POA: Diagnosis not present

## 2014-09-14 DIAGNOSIS — E119 Type 2 diabetes mellitus without complications: Secondary | ICD-10-CM

## 2014-09-14 DIAGNOSIS — Z125 Encounter for screening for malignant neoplasm of prostate: Secondary | ICD-10-CM

## 2014-09-14 LAB — POCT GLYCOSYLATED HEMOGLOBIN (HGB A1C): Hemoglobin A1C: 7.4

## 2014-09-14 MED ORDER — ATORVASTATIN CALCIUM 20 MG PO TABS: 20.0000 mg | ORAL_TABLET | Freq: Every day | ORAL | Status: DC

## 2014-09-14 MED ORDER — LISINOPRIL 20 MG PO TABS: 20.0000 mg | ORAL_TABLET | Freq: Every day | ORAL | Status: DC

## 2014-09-14 MED ORDER — GLIPIZIDE-METFORMIN HCL 2.5-500 MG PO TABS: ORAL_TABLET | ORAL | Status: DC

## 2014-09-14 NOTE — Progress Notes (Addendum)
Subjective:    Patient ID: Alexander Parker, male    DOB: [birth year], 54 y.o.   MRN: [id number]   This chart was scribed for Tami Lin, MD by Hilda Lias, ED Scribe. The patient's care was started at 6:34 PM.  Chief Complaint  Patient presents with  . Diabetes    HPI  HPI Comments: Alexander Parker is a 54 y.o. male who presents to [facility] to get his diabetes checked. Pt is not fluent in [place], making the history limited.  Pt reports having sufficient energy at work and normal blood pressure.  Pt reports having rare trouble sleeping.  Pt denies having any problems with his heart.  No numbness of his feet.  Patient Active Problem List   Diagnosis Date Noted  . DM (diabetes mellitus) 12/23/2011  . HTN (hypertension) 12/23/2011  . Diabetes mellitus 07/29/2011    -  language barrier  Prior to Admission medications   Medication Sig Start Date End Date Taking? Authorizing Provider  glipiZIDE-metformin (METAGLIP) 2.5-500 MG per tablet TAKE ONE TABLET BY MOUTH TWICE DAILY BEFORE MEAL(S)  "NEEDS OFFICE VISIT FOR ADDITIONAL REFILLS" 06/04/14  Yes Chelle S Jeffery, PA-C  lisinopril (PRINIVIL,ZESTRIL) 10 MG tablet Take 1 tablet (10 mg total) by mouth daily. 01/23/14  Yes Orma Flaming, MD  He apparently is no longer taking Pravachol   No Known Allergies Works as a Curator   Review of Systems  Constitutional: Negative for activity change, appetite change, fatigue and unexpected weight change.  Eyes: Negative for visual disturbance.  Cardiovascular: Negative for chest pain, palpitations and leg swelling.  Gastrointestinal: Negative for abdominal pain.  Genitourinary: Negative for urgency and difficulty urinating.       He has been seen in clinic for prostate exams but did not go back to get the results because his insurance refused to pay--this may have been as part of his referral to urology by Dr. Ouida Sills in November    Neurological: Negative for  headaches.  Psychiatric/Behavioral: Negative for behavioral problems and sleep disturbance.       Objective:   Physical Exam  Constitutional: He is oriented to person, place, and time. He appears well-developed and well-nourished. No distress.  HENT:  Head: Normocephalic and atraumatic.  Mouth/Throat: Oropharynx is clear and moist.  Eyes: EOM are normal. Pupils are equal, round, and reactive to light.  Neck: No thyromegaly present.  Cardiovascular: Normal rate, regular rhythm, normal heart sounds and intact distal pulses.   No murmur heard. Pulmonary/Chest: Effort normal.  Abdominal: He exhibits no distension.  Musculoskeletal: He exhibits no edema.  Lymphadenopathy:    He has no cervical adenopathy.  Neurological: He is alert and oriented to person, place, and time. No cranial nerve deficit.  No sensory losses in feet  Skin: Skin is warm and dry.  Psychiatric: He has a normal mood and affect. Thought content normal.  Nursing note and vitals reviewed. BP 148/90 mmHg  Pulse 72  Temp(Src) 98.1 F (36.7 C) (Oral)  Resp 16  Ht 5' 1.75" (1.568 m)  Wt 153 lb 6 oz (69.57 kg)  BMI 28.30 kg/m2  SpO2 98%  Results for orders placed or performed in visit on 09/14/14  POCT glycosylated hemoglobin (Hb A1C)  Result Value Ref Range   Hemoglobin A1C 7.4       Assessment & Plan:  After his labs were done we used telephone services for Spanish to remove the language barrier. This enabled me to explain the changes  in medication for his various diagnoses as well as describing hemosure.  Type 2 diabetes mellitus without complication -  Plan:  Lipid panel, Microalbumin, urine  Increase medication as noted on prescription  Essential hypertension -  Plan: Comprehensive metabolic panel  Increase lisinopril to 20 mg  Hyperlipidemia  Plan: Restart statins with atorvastatin 20 mg  Special screening for malignant neoplasms, colon - Plan: IFOBT HOME  Screening for prostate cancer - Plan:  PSA  Meds ordered this encounter  Medications  . lisinopril (PRINIVIL,ZESTRIL) 20 MG tablet    Sig: Take 1 tablet (20 mg total) by mouth daily.    Dispense:  90 tablet    Refill:  1  . glipiZIDE-metformin (METAGLIP) 2.5-500 MG per tablet    Sig: 1 tablet in the morning and 2 tablets at dinner    Dispense:  270 tablet    Refill:  1  . atorvastatin (LIPITOR) 20 MG tablet    Sig: Take 1 tablet (20 mg total) by mouth daily. For cholesterol    Dispense:  90 tablet    Refill:  1   Will make a follow-up appointment with a Spanish-speaking provider at 104 in 3-6 months for ongoing care He would like to have 6 months of medication befor his next follow-up  I have completed the patient encounter in its entirety as documented by the scribe, with editing by me where necessary. Bach Rocchi P. Laney Pastor, M.D.

## 2014-09-15 ENCOUNTER — Encounter: Payer: Self-pay | Admitting: Family Medicine

## 2014-09-15 LAB — LIPID PANEL
Cholesterol: 242 mg/dL — ABNORMAL HIGH (ref 0–200)
HDL: 31 mg/dL — ABNORMAL LOW (ref >=40)
LDL CALC: 147 mg/dL — AB (ref 0–99)
TRIGLYCERIDES: 321 mg/dL — AB (ref <150)
Total CHOL/HDL Ratio: 7.8 Ratio
VLDL: 64 mg/dL — ABNORMAL HIGH (ref 0–40)

## 2014-09-15 LAB — COMPREHENSIVE METABOLIC PANEL
ALK PHOS: 68 U/L (ref 39–117)
ALT: 28 U/L (ref 0–53)
AST: 17 U/L (ref 0–37)
Albumin: 4.3 g/dL (ref 3.5–5.2)
BILIRUBIN TOTAL: 0.4 mg/dL (ref 0.2–1.2)
BUN: 16 mg/dL (ref 6–23)
CALCIUM: 9.4 mg/dL (ref 8.4–10.5)
CO2: 28 meq/L (ref 19–32)
Chloride: 101 mEq/L (ref 96–112)
Creat: 0.69 mg/dL (ref 0.50–1.35)
Glucose, Bld: 169 mg/dL — ABNORMAL HIGH (ref 70–99)
Potassium: 4.8 mEq/L (ref 3.5–5.3)
SODIUM: 137 meq/L (ref 135–145)
Total Protein: 7.2 g/dL (ref 6.0–8.3)

## 2014-09-15 LAB — MICROALBUMIN, URINE: Microalb, Ur: 0.5 mg/dL (ref <2.0)

## 2014-09-16 LAB — PSA: PSA: 2.74 ng/mL (ref <=4.00)

## 2014-09-19 LAB — IFOBT (OCCULT BLOOD): IFOBT: NEGATIVE

## 2014-09-20 ENCOUNTER — Encounter: Payer: Self-pay | Admitting: Internal Medicine

## 2015-03-27 ENCOUNTER — Ambulatory Visit (INDEPENDENT_AMBULATORY_CARE_PROVIDER_SITE_OTHER): Payer: Self-pay | Admitting: Urgent Care

## 2015-03-27 VITALS — BP 102/64 | HR 75 | Temp 98.2°F | Resp 16 | Ht 61.75 in | Wt 148.0 lb

## 2015-03-27 DIAGNOSIS — E119 Type 2 diabetes mellitus without complications: Secondary | ICD-10-CM

## 2015-03-27 DIAGNOSIS — I10 Essential (primary) hypertension: Secondary | ICD-10-CM

## 2015-03-27 DIAGNOSIS — E782 Mixed hyperlipidemia: Secondary | ICD-10-CM

## 2015-03-27 LAB — COMPLETE METABOLIC PANEL WITH GFR
ALT: 22 U/L (ref 9–46)
AST: 19 U/L (ref 10–35)
Albumin: 4 g/dL (ref 3.6–5.1)
Alkaline Phosphatase: 59 U/L (ref 40–115)
BILIRUBIN TOTAL: 0.5 mg/dL (ref 0.2–1.2)
BUN: 13 mg/dL (ref 7–25)
CO2: 31 mmol/L (ref 20–31)
Calcium: 9.9 mg/dL (ref 8.6–10.3)
Chloride: 101 mmol/L (ref 98–110)
Creat: 0.61 mg/dL — ABNORMAL LOW (ref 0.70–1.33)
GFR, Est African American: >89 mL/min (ref >=60)
GLUCOSE: 79 mg/dL (ref 65–99)
POTASSIUM: 5 mmol/L (ref 3.5–5.3)
SODIUM: 137 mmol/L (ref 135–146)
TOTAL PROTEIN: 6.7 g/dL (ref 6.1–8.1)

## 2015-03-27 LAB — LIPID PANEL
CHOLESTEROL: 219 mg/dL — AB (ref 125–200)
HDL: 56 mg/dL (ref >=40)
LDL Cholesterol: 141 mg/dL — ABNORMAL HIGH (ref <130)
TRIGLYCERIDES: 111 mg/dL (ref <150)
Total CHOL/HDL Ratio: 3.9 Ratio (ref <=5.0)
VLDL: 22 mg/dL (ref <30)

## 2015-03-27 LAB — POCT GLYCOSYLATED HEMOGLOBIN (HGB A1C): Hemoglobin A1C: 6.6

## 2015-03-27 LAB — HEMOGLOBIN A1C: Hgb A1c MFr Bld: 6.6 % — AB (ref 4.0–6.0)

## 2015-03-27 MED ORDER — GLIPIZIDE-METFORMIN HCL 2.5-500 MG PO TABS: 1.0000 | ORAL_TABLET | Freq: Two times a day (BID) | ORAL | Status: DC

## 2015-03-27 MED ORDER — LISINOPRIL 20 MG PO TABS: 20.0000 mg | ORAL_TABLET | Freq: Every day | ORAL | Status: DC

## 2015-03-27 NOTE — Progress Notes (Signed)
    MRN: [id number] DOB: [birth year]  Subjective:   Alexander Parker is a 54 y.o. male presenting for chief complaint of Follow-up  Diabetes - managed with glipizide-metformin. Does well with this medication. Takes it daily, tries to eat healthy. Checks feet daily. Does not exercise, has a bike at home and does not use it. Denies polydipsia, polyuria, blurred vision, chest pain, shortness of breath, nausea, vomiting, abdominal pain, numbness or tingling, frequent skin infections.  HTN - managed with lisinopril. Diet and exercise as above. Denies lightheadedness, dizziness, chronic headache, double vision, hematuria, lower leg swelling.  HL - managed with atorvastatin. Denies myalgias, mental fog. Diet and exercise as above. ROS as above.  Denies any other aggravating or relieving factors, no other questions or concerns.  [name] has a current medication list which includes the following prescription(s): atorvastatin, glipizide-metformin, and lisinopril. Also has No Known Allergies.  Alexander Parker  has a past medical history of Diabetes mellitus without complication ([place]) and Hypertension. Also  has no past surgical history on file.  Objective:   Vitals: BP 102/64 mmHg  Pulse 75  Temp(Src) 98.2 F (36.8 C) (Oral)  Resp 16  Ht 5' 1.75" (1.568 m)  Wt 148 lb (67.132 kg)  BMI 27.30 kg/m2  SpO2 98%  Physical Exam  Constitutional: He is oriented to person, place, and time. He appears well-developed and well-nourished.  HENT:  Mouth/Throat: Oropharynx is clear and moist.  Eyes: Right eye exhibits no discharge. Left eye exhibits no discharge. No scleral icterus.  Neck: Normal range of motion. Neck supple. No thyromegaly present.  Cardiovascular: Normal rate, regular rhythm and intact distal pulses.  Exam reveals no gallop and no friction rub.   No murmur heard. Pulmonary/Chest: No respiratory distress. He has no wheezes. He has no rales.  Abdominal: Soft. Bowel sounds are normal. He exhibits no  distension and no mass. There is no tenderness.  Musculoskeletal:       Right knee: He exhibits swelling (trace) and ecchymosis. He exhibits normal range of motion, no effusion, no deformity, no laceration and no erythema. No tenderness found.       Legs: Neurological: He is alert and oriented to person, place, and time.  Skin: Skin is warm and dry. No rash noted. No erythema. No pallor.   Results for orders placed or performed in visit on 03/27/15 (from the past 24 hour(s))  POCT glycosylated hemoglobin (Hb A1C)     Status: None   Collection Time: 03/27/15 11:51 AM  Result Value Ref Range   Hemoglobin A1C 6.6     Assessment and Plan :   1. Controlled type 2 diabetes mellitus without complication, without long-term current use of insulin ([place]) - Well controlled, refill provided, script changed to glip-met twice daily. Continue healthy diet, start exercising. Recheck in 6 months.  2. Mixed hyperlipidemia - Lipid panel pending, refill as appropriate - Diet and exercise as above. Recheck in 6 months  3. HTN - Well-controlled, refill provided for lisinopril, diet and exercise as above. F/u as above.   Jaynee Eagles, PA-C [facility] [phone] 03/27/2015 11:34 AM

## 2015-03-27 NOTE — Patient Instructions (Signed)
Diabetes mellitus tipo2, adultos (Type 2 Diabetes Mellitus, Adult) La diabetes mellitus tipo2, generalmente denominada diabetes tipo2, es una enfermedad prolongada (crnica). En la diabetes tipo2, el pncreas no produce suficiente insulina (una hormona), las clulas son menos sensibles a la insulina que se produce (resistencia a la insulina), o ambos. Normalmente, la [employer] azcares de los alimentos a las clulas de los tejidos. Las clulas de los tejidos [employer] azcares para Dealer. La falta de insulina o la falta de una respuesta normal a la insulina hace que el exceso de azcar se acumule en la sangre en lugar de Location manager en las clulas de los tejidos. Como resultado, se producen niveles altos de Dispensing optician (hiperglucemia). El efecto de los niveles altos de azcar (glucosa) puede causar muchas complicaciones.  La diabetes tipo2 antes tambin se denominaba diabetes del [place], pero puede ocurrir a Hotel manager.  [place] persona tiene mayor predisposicin a desarrollar diabetes tipo 2 si alguien en su familia tiene la enfermedad y tambin tiene uno o ms de los siguientes factores de riesgo principales:  Aumento de [place], sobrepeso u obesidad.  Estilo de vida sedentario.  Una historia de consumo constante de alimentos de altas caloras. Mantener un peso saludable y realizar actividad fsica regular puede reducir la probabilidad de desarrollar diabetes tipo 2. SNTOMAS  Una persona con diabetes tipo 2 no presenta sntomas en un principio. Los sntomas de la diabetes tipo 2 aparecen lentamente. Los sntomas son:  Aumento de la sed (polidipsia).  Aumento de la miccin (poliuria).  Orina con ms frecuencia durante la noche (nocturia).  Cambios repentinos en el peso o sin motivo aparente.  Infecciones frecuentes y recurrentes.  Cansancio (fatiga).  Debilidad.  Cambios en la visin, como visin borrosa.  Olor a Therapist, nutritional.  Dolor abdominal.  Nuseas o vmitos.  Cortes o moretones que tardan en sanarse.  Hormigueo o adormecimiento de las manos y los pies.  Una herida abierta en la piel (lcera). DIAGNSTICO Con frecuencia la diabetes tipo 2 no se diagnostica hasta que se presentan las complicaciones de la diabetes. La diabetes tipo 2 se diagnostica cuando los sntomas o las complicaciones se presentan y cuando aumentan los niveles de glucosa en la [place]. El nivel de glucosa en la sangre puede controlarse en uno o ms de los siguientes anlisis de sangre:  Medicin de glucosa en la sangre en [place]. No se le permitir comer durante al menos 8 horas antes de que se tome [place] de [place].  Pruebas al azar de glucosa en la sangre. El nivel de glucosa en la sangre se controla en cualquier momento del da sin importar el momento en que haya comido.  Prueba de A1c (hemoglobina glucosilada) Una prueba de A1c proporciona informacin sobre el control de la glucosa en la sangre durante los ltimos 3 meses.  Prueba de tolerancia a la glucosa oral (PTGO). La glucosa en la sangre se mide despus de no haber comido (ayunas) durante dos horas y despus de beber una bebida que contenga glucosa. TRATAMIENTO   Usted puede necesitar administrarse insulina o medicamentos para la diabetes todos los das para [employer] niveles de glucosa en la sangre en el rango deseado.  Si [place] insulina, tal vez necesite ajustar la dosis segn los carbohidratos que haya consumido en cada comida o colacin.  [employer] del tratamiento, se recomienda hacer cambios en el estilo de vida. Estos pueden incluir lo siguiente:  Building surveyor  plan de alimentacin personalizado elaborado por un nutricionista.  Hacer ejercicio fsico a diario. El mdico establecer los objetivos personalizados del tratamiento para usted en funcin de su edad, los medicamentos que toma, el tiempo que hace que tiene diabetes y cualquier otra enfermedad que  padezca. Generalmente, el objetivo del tratamiento es [employer] siguientes niveles sanguneos de glucosa:  Antes de las comidas (preprandial): de 80 a 130 mg/dl.  Antes de las comidas (preprandial): de 80 a 130 mg/dl.  A1c: menos del 6,5 % al 7 %. INSTRUCCIONES PARA EL CUIDADO EN EL HOGAR   Controle su nivel de hemoglobina A1c dos veces al ao.  Contrlese a diario Retail buyer de glucosa en la sangre segn las indicaciones de su mdico.  Supervise las cetonas en la orina cuando est enferma y segn las indicaciones de su [place] medicamento para la diabetes o adminstrese insulina segn las indicaciones de su mdico para Contractor nivel de glucosa en la sangre en el rango deseado.  Nunca se quede sin medicamento para la diabetes o sin insulina. Es necesario que la reciba [employer].  Si [place] insulina, tal vez deba ajustar la cantidad de insulina administrada segn los carbohidratos consumidos. Los hidratos de carbono pueden aumentar los niveles de glucosa en la sangre, pero deben incluirse en su dieta. Los hidratos de carbono aportan vitaminas, minerales y [place] que son Ardelia Mems parte esencial de una dieta saludable. Los hidratos de carbono se encuentran en frutas, verduras, cereales integrales, productos lcteos, legumbres y alimentos que contienen azcares aadidos.  Consuma alimentos saludables. Programe una cita con un nutricionista certificado para que lo ayude a Building services engineer de alimentacin adecuado para usted.  Baje de peso si es necesario.  Lleve una tarjeta de alerta mdica o use una pulsera o medalla de alerta mdica.  Lleve con usted una colacin de 15gramos de hidratos de carbono en todo momento para controlar los niveles bajos de glucosa en la sangre (hipoglucemia). Algunos ejemplos de colaciones de 15gramos de hidratos de carbono son los siguientes:  Tabletas de glucosa, 3 o 4.  Gel de glucosa, tubo de 15 gramos.  Pasas de uva, 2 cucharadas (24  gramos).  Caramelos de goma, 6.  Galletas de [place], 8.  Gaseosa comn, 4onzas (16mlilitros).  Pastillas de goma, 9.  Reconocer la hipoglucemia. La hipoglucemia se produce cuando los niveles de glucosa en la sangre son de 70 mg/dl o menos. El riesgo de hipoglucemia aumenta durante el ayuno o cuando se saltea las comidas, durante o despus de rOptometristejercicio intenso y mLa Platteduerme. Los sntomas de hipoglucemia son:  Temblores o sacudidas.  Disminucin de la capacidad de concentracin.  Sudoracin.  Aumento de la frecuencia cardaca.  Dolor de c[place]  Sequedad en la boca.  Hambre.  Irritabilidad.  Ansiedad.  Sueo agitado.  Alteracin del habla o de la coordinacin.  Confusin.  Tratar la hipoglucemia rpidamente. Si usted est alerta y puede tragar con seguridad, siga la regla de 15/15 que consiste en:  TMerck & Co15 y 20gramos de glucosa de accin rpida o carbohidratos. Las opciones de accin rpida son un gel de glucosa, tabletas de glucosa, o 4 onzas (120 ml) de jugo de frutas, gaseosa comn, o leche baja en grasa.  Compruebe su nivel de glucosa en la sangre 15 minutos despus de tomar la glucosa.  Tome entre 15 y 228gramos ms de glucosa si el nivel de glucosa en la sangre todava es de 751mdl o inferior.  Ingiera una comida o una colacin en el lapso de 1 hora una vez que los niveles de glucosa en la sangre vuelven a la normalidad.  Est atento a si siente mucha sed u orina con mayor frecuencia, porque son signos tempranos de hiperglucemia. El reconocimiento temprano de la hiperglucemia permite un tratamiento oportuno. Trate la hiperglucemia segn le indic su mdico.  Haga, al menos, 165mnutos de actividad fsica moderada a la semana, distribuidos en, por lo menos, 3das a la semana o como lo indique su mdico. Adems, debe realizar ejercicios de resistencia por lo menos 2veces a la semana o como lo indique su mdico. Trate de no permanecer  inactivo durante ms de 918mutos seguidos.  Ajuste su medicamento y la ingesta de alimentos, segn sea necesario, si inicia un nuevo ejercicio o deporte.  Siga su plan para los das de enfermedad cuando no puede comer o beber como de co[place] No consuma ningn producto que contenga tabaco, como cigarrillos, tabaco de maHigher education careers adviser ciPsychologist, sport and exerciseSi necesita ayuda para dejar de fumar, hable con el mdico.  Limite el consumo de alcohol a no ms de 1 medida por da en las mujeres no embarazadas y 2 medidas en los hombres. Debe beber alcohol solo mientras come. Hable con su mdico acerca de si el alcohol es seguro para usted. Informe a su mdico si bebe alcohol varias veces a la semana.  Concurra a todas las visitas de control como se lo haya indicado el mdico. Esto es importante.  Programe un examen de la vista poco despus del diagnstico de diabetes tipo 2 y luego anualmente.  Realice diariamente el cuidado de la piel y de los pies. Examine su piel y los pies diariamente para ver si tiene cortes, moretones, enrojecimiento, problemas en las uas, sangrado, ampollas o llPension scheme managerSu mdico debe hacerle un examen de los pies una vez por ao.  Cepllese los dientes y encas por lo menos dos veces al da y use hilo dental al menos una vez por da. Concurra regularmente a las visitas de control con el dentista.  Comparta su plan de control de diabetes en el trabajo o en la escuela.  MaShelbyvilleSe recomienda que se vacune contra la gripe todos los aoMesaAdems, que se vacune contra la neumona (vacuna antineumoccica). Si es mayor de 6579os y nunca se vaControl and instrumentation engineera neumona, esta vacuna puede administrarse como una serie de dos vacunas por separado. Pregntele al mdico qu otras vacunas se pueden recomendar.  Aprenda a maEngineer, maintenance (IT) Obtenga la mayor cantidad posible de informacin sobre la diabetes y solicite ayuda siempre que sea necesario.  Busque  programas de rehabilitacin y participe en ellos para mantener o mejorar su independencia y su calidad de vida. Solicite la derivacin a fisioterapia o terapia ocupacional si se le adCarMax la mano, o tiene problemas para asearse, vestirse, comer, o durante la acStone Citysica. SOLICITE ATENCIN MDICA SI:   No puede comer alimentos o beber por ms de 6 horas.  Tuvo nuseas o ha vomitado durante ms de 6 horas.  Su nivel de glucosa en la sangre es mayor de 240 mg/dl.  Presenta algn cambio en el estado mental.  Desarrolla una enfermedad grave adicional.  Tuvo diarrea durante ms de 6 horas.  Ha estado enfermo o ha tenido fiebre durante un par de das y no mejora.  Siente dolor al practicar cualquier actividad fsica. SOLICITE ATENCIN MDICA DE INMEDIATO SI:  Tiene dificultad para respirar.  Tiene niveles de cetonas moderados a altos.   Esta informacin no tiene Marine scientist el consejo del mdico. Asegrese de hacerle al mdico cualquier pregunta que tenga.   Document Released: 05/14/2005 Document Revised: 02/02/2015 Elsevier Interactive Patient Education [employer].

## 2015-03-30 ENCOUNTER — Telehealth: Payer: Self-pay | Admitting: Urgent Care

## 2015-03-30 ENCOUNTER — Encounter: Payer: Self-pay | Admitting: Family Medicine

## 2015-03-30 MED ORDER — ATORVASTATIN CALCIUM 20 MG PO TABS: 20.0000 mg | ORAL_TABLET | Freq: Every day | ORAL | Status: DC

## 2015-03-30 NOTE — Telephone Encounter (Signed)
Cholesterol improved, refill Atorvastatin. CMet unremarkable. Patient verbalized understanding.

## 2015-09-07 ENCOUNTER — Ambulatory Visit (INDEPENDENT_AMBULATORY_CARE_PROVIDER_SITE_OTHER): Payer: Self-pay | Admitting: Urgent Care

## 2015-09-07 ENCOUNTER — Ambulatory Visit (INDEPENDENT_AMBULATORY_CARE_PROVIDER_SITE_OTHER): Payer: Self-pay

## 2015-09-07 VITALS — BP 115/69 | HR 79 | Temp 98.0°F | Resp 17 | Ht 63.0 in | Wt 151.0 lb

## 2015-09-07 DIAGNOSIS — M25561 Pain in right knee: Secondary | ICD-10-CM

## 2015-09-07 DIAGNOSIS — R58 Hemorrhage, not elsewhere classified: Secondary | ICD-10-CM

## 2015-09-07 DIAGNOSIS — M25461 Effusion, right knee: Secondary | ICD-10-CM

## 2015-09-07 MED ORDER — NAPROXEN SODIUM 550 MG PO TABS: 550.0000 mg | ORAL_TABLET | Freq: Two times a day (BID) | ORAL | Status: DC

## 2015-09-07 NOTE — Progress Notes (Signed)
    MRN: [id number] DOB: 1961-04-21  Subjective:   Alexander Parker is a 55 y.o. male presenting for chief complaint of Knee Pain  Reports 2 day history of right knee pain. 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 He has tried otc cream called Arnica with minimal relief. Has also tried Advil which did help. The next day, patient noticed a significant bruise, still has right knee pain and some swelling. Denies redness, warmth, weakness, knee buckling, history of knee injuries.  Alexander Parker has a current medication list which includes the following prescription(s): atorvastatin, glipizide-metformin, and lisinopril. Also has No Known Allergies.  Alexander Parker  has a past medical history of Diabetes mellitus without complication ([place]) and Hypertension. Also  has no past surgical history on file.  Objective:   Vitals: BP 115/69 mmHg  Pulse 79  Temp(Src) 98 F (36.7 C) (Oral)  Resp 17  Ht 5\' 3"  (1.6 m)  Wt 151 lb (68.493 kg)  BMI 26.76 kg/m2  SpO2 99%  Physical Exam  Constitutional: He is oriented to person, place, and time. He appears well-developed and well-nourished.  Cardiovascular: Normal rate.   Pulmonary/Chest: Effort normal.  Musculoskeletal:       Right knee: He exhibits swelling (over prepatellar bursa) and ecchymosis (large area of ecchymosis over area depicted). He exhibits normal range of motion, no deformity, no laceration, no erythema, normal alignment, no LCL laxity, normal patellar mobility and no bony tenderness. Tenderness found. Medial joint line, lateral joint line and patellar tendon tenderness noted.       Legs: Neurological: He is alert and oriented to person, place, and time. He has normal reflexes.   Dg Knee 1-2 Views Right  09/07/2015  CLINICAL DATA:  Right knee pain likely secondary to chronic kneeling while painting, some swelling. EXAM: RIGHT KNEE - 1-2 VIEW  COMPARISON:  None in PACs FINDINGS: The bones of the knee are adequately mineralized. There is beaking of the tibial spines. There is mild irregularity of the articular surface of the medial femoral condyles. There is a small spur arising from the periphery of the medial tibial plateau. There is a small spur arising from the inferior articular margin of the patella. There is no significant joint effusion. There is no fracture or dislocation. IMPRESSION: There are mild osteoarthritic changes centered on the tibial spines and medial and patellofemoral compartments. Electronically Signed   By: David  Martinique M.D.   On: 09/07/2015 10:04   Assessment and Plan :   This case was precepted with Dr. Everlene Farrier.   1. Right knee pain 2. Knee swelling, right 3. Ecchymosis - Suspect a vastus medialis tear with associated pre-patellar bursitis due to the nature of his work and frequent kneeling. Counseled patient on conservative management. Work restrictions provided. Use Anaprox twice daily for 2 weeks. RTC thereafter if no improvement.  Jaynee Eagles, PA-C [facility] [phone] 09/07/2015 9:22 AM

## 2015-09-07 NOTE — Patient Instructions (Addendum)
Dolor de rodilla (Knee Pain) El dolor de rodilla es un sntoma muy comn y puede tener muchas causas. Suele desaparecer cuando se siguen las indicaciones del mdico en lo que respecta a Best boy y las molestias en la casa. Sin embargo, puede Warden/ranger en una afeccin que requiere [place]. Algunas afecciones pueden incluir lo siguiente:  Artritis por uso y Holiday representative (artrosis).  Artritis por hinchazn e irritacin (artritis reumatoide o gota).  Un quiste o un crecimiento en la rodilla.  Una infeccin en la articulacin de la rodilla.  Una lesin que no se [place].  Dao, hinchazn o irritacin de los tejidos que sostienen la rodilla (distensin de ligamentos o tendinitis). Si el dolor de Development worker, community, tal vez haya que realizar ms estudios para Midwife, los cuales pueden incluir radiografas u otros estudios de diagnstico por imgenes de la rodilla. Adems, es posible que haya que extraerle lquido de la rodilla. El tratamiento del dolor continuo de rodilla depende de la causa, pero puede incluir lo siguiente:  Medicamentos para Best boy o reducir la hinchazn.  Inyecciones de corticoides en la rodilla.  Fisioterapia.  Ciruga. [place] los medicamentos solamente como se lo haya indicado el mdico.  Mantenga la rodilla en reposo y en alto (elevada) mientras est descansando.  No haga cosas que le causen dolor o que lo intensifiquen.  Evite las actividades o los ejercicios de alto impacto, como correr, Art therapist soga o hacer saltos de tijera.  Aplique hielo sobre la zona de la rodilla:  Ponga el hielo en una bolsa plstica.  Coloque una toalla entre la piel y la bolsa de hielo.  Coloque el hielo durante 20 minutos, 2 a 3 veces por da.  Pregntele al mdico si debe usar una Scientist, forensic.  Cuando duerma, pngase una almohada debajo de la rodilla.  Baje de peso si es  necesario. El [place] extra puede generar presin en la rodilla.  No consuma ningn producto que contenga tabaco, lo que incluye cigarrillos, tabaco de Higher education careers adviser o Psychologist, sport and exercise. Si necesita ayuda para dejar de fumar, consulte al mdico. Fumar puede retrasar la curacin de cualquier problema que tenga en el hueso y Water engineer. SOLICITE ATENCIN MDICA SI:  El dolor de rodilla contina, [place].  Tiene fiebre junto con dolor de rodilla.  La rodilla se le tuerce o se le traba.  La rodilla est ms hinchada. SOLICITE ATENCIN MDICA DE INMEDIATO SI:   La articulacin de la rodilla est caliente al tacto.  Tiene dolor en el pecho o dificultad para respirar.   Esta informacin no tiene Marine scientist el consejo del mdico. Asegrese de hacerle al mdico cualquier pregunta que tenga.   Document Released: 10/31/2007 Document Revised: 06/04/2014 Elsevier Interactive Patient Education [employer].    IF you received an x-ray today, you will receive an invoice from [facility]. Please contact [facility] at [phone] with questions or concerns regarding your invoice.   IF you received labwork today, you will receive an invoice from [employer]. Please contact [facility] at [phone] with questions or concerns regarding your invoice.   Our billing staff will not be able to assist you with questions regarding bills from these companies.  You will be contacted with the lab results as soon as they are available. The fastest way to get your results is to activate your My Chart account. Instructions are located on the last  page of this paperwork. If you have not heard from [place] regarding the results in 2 weeks, please contact this office.

## 2015-09-25 ENCOUNTER — Other Ambulatory Visit: Payer: Self-pay | Admitting: Urgent Care

## 2016-04-08 ENCOUNTER — Other Ambulatory Visit: Payer: Self-pay | Admitting: Urgent Care

## 2016-04-08 DIAGNOSIS — E119 Type 2 diabetes mellitus without complications: Secondary | ICD-10-CM

## 2016-04-09 ENCOUNTER — Ambulatory Visit (INDEPENDENT_AMBULATORY_CARE_PROVIDER_SITE_OTHER): Payer: Self-pay | Admitting: Physician Assistant

## 2016-04-09 VITALS — BP 106/66 | HR 90 | Temp 98.2°F | Resp 16 | Ht 63.0 in | Wt 150.4 lb

## 2016-04-09 DIAGNOSIS — E119 Type 2 diabetes mellitus without complications: Secondary | ICD-10-CM

## 2016-04-09 DIAGNOSIS — I10 Essential (primary) hypertension: Secondary | ICD-10-CM

## 2016-04-09 LAB — HEMOGLOBIN A1C
Hgb A1c MFr Bld: 9.7 % — ABNORMAL HIGH (ref <5.7)
MEAN PLASMA GLUCOSE: 232 mg/dL

## 2016-04-09 LAB — COMPREHENSIVE METABOLIC PANEL
ALBUMIN: 4.5 g/dL (ref 3.6–5.1)
ALT: 31 U/L (ref 9–46)
AST: 17 U/L (ref 10–35)
Alkaline Phosphatase: 91 U/L (ref 40–115)
BUN: 13 mg/dL (ref 7–25)
CALCIUM: 10.1 mg/dL (ref 8.6–10.3)
CHLORIDE: 98 mmol/L (ref 98–110)
CO2: 29 mmol/L (ref 20–31)
Creat: 0.65 mg/dL — ABNORMAL LOW (ref 0.70–1.33)
GLUCOSE: 330 mg/dL — AB (ref 65–99)
Potassium: 4.7 mmol/L (ref 3.5–5.3)
SODIUM: 134 mmol/L — AB (ref 135–146)
Total Bilirubin: 0.5 mg/dL (ref 0.2–1.2)
Total Protein: 7.5 g/dL (ref 6.1–8.1)

## 2016-04-09 LAB — LIPID PANEL
CHOL/HDL RATIO: 4.1 ratio (ref <5.0)
CHOLESTEROL: 168 mg/dL (ref <200)
HDL: 41 mg/dL (ref >40)
LDL Cholesterol: 103 mg/dL — ABNORMAL HIGH (ref <100)
Triglycerides: 118 mg/dL (ref <150)
VLDL: 24 mg/dL (ref <30)

## 2016-04-09 MED ORDER — ATORVASTATIN CALCIUM 20 MG PO TABS: ORAL_TABLET | ORAL | 3 refills | Status: DC

## 2016-04-09 MED ORDER — GLIPIZIDE-METFORMIN HCL 2.5-500 MG PO TABS: 1.0000 | ORAL_TABLET | Freq: Two times a day (BID) | ORAL | 3 refills | Status: DC

## 2016-04-09 MED ORDER — LISINOPRIL 20 MG PO TABS: 20.0000 mg | ORAL_TABLET | Freq: Every day | ORAL | 3 refills | Status: DC

## 2016-04-09 NOTE — Progress Notes (Signed)
Subjective:    Patient ID: Alexander Parker, male    DOB: June 01, 1960, 55 y.o.   MRN: [id number]   Chief Complaint  Patient presents with  . Medication Refill    diabetes check, lipitor, metaglip, lisinopril   Patient Care Team: Jaynee Eagles, PA-C as PCP - General (Urgent Care)   Interview completed in Spanish via Str[employer] video interpreter   HPI: Presents for diabetes mellitus follow-up and refill of Atorvastatin, Glipizide-Metformin, and Lisinopril. Denies checking his blood sugar at home. Denies history of recent hypoglycemic events. Endorses some polyuria. Denies polydipsia, polyphagia, hematuria, dysuria, urinary urgency. Checks his feet regularly at home, denies neuropathy or ulcers. States his blood pressure has been controlled to his knowledge. Denies headaches, blurry vision, chest pain. States he took the last dose of all of his medications yesterday.  States he had an eye exam while in [place] last December (2016) and they noted some decreased vision so he has been wearing reading glasses.   Last diabetes check-up was on 03/27/2015. Glucose was measured as 79 mg/dL at this time, HgbA1C of 6.6%. Lipid panel values included elevated total cholesterol of 219 mg/dL and elevated LDL of 141 mg/dL, values otherwise within normal limits.  Has noticed some thickening in the skin on bilateral knees. 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 Endorses wearing knee pads at work.  Currently without health insurance, inquiring about flu vaccine. Also out of date on several health maintenance items including Hep. C screening, pneumonia vaccine, HIV screening, Tdap, colonoscopy.  Review of Systems Pertinent ROS mentioned above in HPI, otherwise negative.     Objective: Blood pressure 106/66, pulse 90, temperature 98.2 F (36.8 C), temperature source Oral, resp. rate 16, height 5\' 3"  (1.6 m), weight 150 lb 6.4 oz (68.2 kg), SpO2 96 %.   Physical Exam  Constitutional: He is  oriented to person, place, and time. He appears well-developed and well-nourished.  HENT:  Head: Normocephalic and atraumatic.  Mouth/Throat: Uvula is midline, oropharynx is clear and moist and mucous membranes are normal. Mucous membranes are not pale, not dry and not cyanotic. No oral lesions. Normal dentition. No uvula swelling. No oropharyngeal exudate, posterior oropharyngeal edema, posterior oropharyngeal erythema or tonsillar abscesses.  Eyes: Conjunctivae are normal. Pupils are equal, round, and reactive to light. Right eye exhibits no discharge and no exudate. Left eye exhibits no discharge and no exudate. Right conjunctiva is not injected. Left conjunctiva is not injected. No scleral icterus. Right pupil is round and reactive. Left pupil is round and reactive. Pupils are equal.  Fundoscopic exam:      The right eye shows no arteriolar narrowing, no AV nicking, no exudate, no hemorrhage and no papilledema.       The left eye shows no arteriolar narrowing, no AV nicking, no exudate, no hemorrhage and no papilledema.  Neck: Normal range of motion. Neck supple. No JVD present. No tracheal deviation present. No thyromegaly present.  Cardiovascular: Normal rate, regular rhythm, normal heart sounds and intact distal pulses.  Exam reveals no gallop and no friction rub.   No murmur heard. Pulses:      Radial pulses are 2+ on the right side, and 2+ on the left side.       Popliteal pulses are 2+ on the right side, and 2+ on the left side.       Dorsalis pedis pulses are 2+ on the right side, and 2+ on the left side.  Posterior tibial pulses are 2+ on the right side, and 2+ on the left side.  Pulmonary/Chest: Effort normal and breath sounds normal. No respiratory distress. He has no decreased breath sounds. He has no wheezes. He has no rhonchi. He has no rales.  Lymphadenopathy:    He has no cervical adenopathy.  Neurological: He is alert and oriented to person, place, and time. No sensory  deficit. GCS eye subscore is 4. GCS verbal subscore is 5. GCS motor subscore is 6.  Reflex Scores:      Patellar reflexes are 2+ on the right side and 2+ on the left side.      Achilles reflexes are 2+ on the right side and 2+ on the left side. Skin: Skin is warm and dry. No rash noted. No erythema.     Psychiatric: He has a normal mood and affect. His behavior is normal.   Diabetic Foot Exam - Simple   Simple Foot Form Diabetic Foot exam was performed with the following findings:  Yes 04/09/2016 12:35 PM  Visual Inspection Sensation Testing Pulse Check Comments       Assessment & Plan:  1. Type 2 diabetes mellitus without complication, without long-term current use of insulin ([place]) Prior HgA1C from over 1 year ago measured 6.6%. Diabetic foot exam WNL today. Continue Glipizide-Metformin. Urine microalbumin, CMP, lipid panel, and HgA1C to be re-evaluated today and medication dose to be changed as needed upon review of results.  - HM DIABETES FOOT EXAM - Microalbumin, urine - Comprehensive metabolic panel - Hemoglobin A1c - Lipid panel - glipiZIDE-metformin (METAGLIP) 2.5-500 MG tablet; Take 1 tablet by mouth 2 (two) times daily before a meal.  Dispense: 180 tablet; Refill: 3   2. Essential hypertension, Hyperlipidemia Blood pressure 106/66 mmHg in clinic today. Well-controlled on Lisinopril, continue as prescribed. Prior lipid panel with elevated total cholesterol and LDL, will re-evaluate lipid panel today and review medication dose based on review of results. - lisinopril (PRINIVIL,ZESTRIL) 20 MG tablet; Take 1 tablet (20 mg total) by mouth daily.  Dispense: 90 tablet; Refill: 3 - atorvastatin (LIPITOR) 20 MG tablet; TAKE ONE TABLET BY MOUTH ONCE DAILY FOR CHOLESTEROL  Dispense: 90 tablet; Refill: 3

## 2016-04-09 NOTE — Progress Notes (Signed)
Patient ID: Alexander Parker, male    DOB: Dec 07, 1960, 55 y.o.   MRN: [id number]  PCP: Kennon Portela, MD  Chief Complaint  Patient presents with  . Medication Refill    diabetes check, lipitor, metaglip, lisinopril    Subjective:    HPI Presents for evaluation of diabetes.  Stratus video interpreter used for the visit.  Last seen here 4/217 for knee pain. Last visit to address diabetes was 03/27/2015. Glucose 79 A1C 6.6% TC 219, TG 111, HDL 56, LDL 141 Last urine microalbumin 08/2014 was normal.  He is overdue for all the health maintenance items: diabetes foot exam, diabetes eye exam (may have had this exam in 2016 while in [place]), pneumovax, HIV and Hep C screening, Tdap, seasonal flu vaccine and colonoscopy. His lack of insurance has prevented him from addressing these gaps.  Overall, he reports he's doing well. No home glucose monitoring. Some increased urination. Took last dose of all his medications yesterday.  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 He asks for what moisturizer is recommended.   Review of Systems Some polyuria. Denies chest pain, shortness of breath, HA, dizziness, vision change, nausea, vomiting, diarrhea, constipation, melena, hematochezia, dysuria, increased urinary urgency, increased hunger or thirst, unintentional weight change, unexplained myalgias or arthralgias, rash.     Patient Active Problem List   Diagnosis Date Noted  . DM (diabetes mellitus) ([place]) 12/23/2011  . HTN (hypertension) 12/23/2011  . Diabetes mellitus ([place]) 07/29/2011     Prior to Admission medications   Medication Sig Start Date End Date Taking? Authorizing Provider  atorvastatin (LIPITOR) 20 MG tablet TAKE ONE TABLET BY MOUTH ONCE DAILY FOR CHOLESTEROL 09/27/15  Yes Jaynee Eagles, PA-C  glipiZIDE-metformin (METAGLIP) 2.5-500 MG tablet Take 1 tablet by mouth 2 (two) times daily before a meal. 03/27/15  Yes Jaynee Eagles, PA-C  lisinopril  (PRINIVIL,ZESTRIL) 20 MG tablet TAKE ONE TABLET BY MOUTH DAILY 09/27/15  Yes Jaynee Eagles, PA-C  naproxen sodium (ANAPROX DS) 550 MG tablet Take 1 tablet (550 mg total) by mouth 2 (two) times daily with a meal. Patient not taking: Reported on 04/09/2016 09/07/15   Jaynee Eagles, PA-C     No Known Allergies     Objective:  Physical Exam  Constitutional: He is oriented to person, place, and time. He appears well-developed and well-nourished. He is active and cooperative. No distress.  BP 106/66 (BP Location: Right Arm, Patient Position: Sitting, Cuff Size: Small)   Pulse 90   Temp 98.2 F (36.8 C) (Oral)   Resp 16   Ht 5\' 3"  (1.6 m)   Wt 150 lb 6.4 oz (68.2 kg)   SpO2 96%   BMI 26.64 kg/m   HENT:  Head: Normocephalic and atraumatic.  Right Ear: Hearing normal.  Left Ear: Hearing normal.  Eyes: Conjunctivae are normal. No scleral icterus.  Neck: Normal range of motion. Neck supple. No thyromegaly present.  Cardiovascular: Normal rate, regular rhythm and normal heart sounds.   Pulses:      Radial pulses are 2+ on the right side, and 2+ on the left side.  Pulmonary/Chest: Effort normal and breath sounds normal.  Lymphadenopathy:       Head (right side): No tonsillar, no preauricular, no posterior auricular and no occipital adenopathy present.       Head (left side): No tonsillar, no preauricular, no posterior auricular and no occipital adenopathy present.    He has no cervical adenopathy.  Right: No supraclavicular adenopathy present.       Left: No supraclavicular adenopathy present.  Neurological: He is alert and oriented to person, place, and time. No sensory deficit.  Skin: Skin is warm, dry and intact. No rash noted. No cyanosis or erythema. Nails show no clubbing.  Hypertrophic skin both knees, consistent with history.  Psychiatric: He has a normal mood and affect. His speech is normal and behavior is normal.    Diabetic Foot Exam - Simple   Simple Foot Form Diabetic Foot  exam was performed with the following findings:  Yes 04/09/2016 12:35 PM  Visual Inspection No deformities, no ulcerations, no other skin breakdown bilaterally:  Yes Sensation Testing Intact to touch and monofilament testing bilaterally:  Yes Pulse Check Posterior Tibialis and Dorsalis pulse intact bilaterally:  Yes Comments           Assessment & Plan:   1. Type 2 diabetes mellitus without complication, without long-term current use of insulin ([place]) Update labs. Continue current treatment, with adjustments as indicated by lab results. - HM DIABETES FOOT EXAM - Microalbumin, urine - Comprehensive metabolic panel - Hemoglobin A1c - Lipid panel - lisinopril (PRINIVIL,ZESTRIL) 20 MG tablet; Take 1 tablet (20 mg total) by mouth daily.  Dispense: 90 tablet; Refill: 3 - glipiZIDE-metformin (METAGLIP) 2.5-500 MG tablet; Take 1 tablet by mouth 2 (two) times daily before a meal.  Dispense: 180 tablet; Refill: 3 - atorvastatin (LIPITOR) 20 MG tablet; TAKE ONE TABLET BY MOUTH ONCE DAILY FOR CHOLESTEROL  Dispense: 90 tablet; Refill: 3  2. Essential hypertension Controlled. Continue lisinopril. - lisinopril (PRINIVIL,ZESTRIL) 20 MG tablet; Take 1 tablet (20 mg total) by mouth daily.  Dispense: 90 tablet; Refill: 3   Return in about 3 months (around 07/10/2016) for re-evaluation with Rosario Adie, PA-C.    Fara Chute, PA-C Physician Assistant-Certified [facility]

## 2016-04-09 NOTE — Patient Instructions (Addendum)
Please explore lower cost options for vaccines at your local pharmacy or the [facility] Department: seasonal influenza, pneumococcal (Pneumovax-23) and Tdap.  Please explore options for eye exam, colonoscopy, HIV and Hepatitis C screening. The [facility] may be able to provide HIV screening at no cost to you.  Please contact the [place] [phone]. You may qualify for lower cost services within the [facility] system.  For the dry skin on your knees, try one of these: Aquaphor, Cetaphil, lanolin    IF you received an x-ray today, you will receive an invoice from [facility]. Please contact [facility] at [phone] with questions or concerns regarding your invoice.   IF you received labwork today, you will receive an invoice from [employer]. Please contact [facility] at [phone] with questions or concerns regarding your invoice.   Our billing staff will not be able to assist you with questions regarding bills from these companies.  You will be contacted with the lab results as soon as they are available. The fastest way to get your results is to activate your My Chart account. Instructions are located on the last page of this paperwork. If you have not heard from [place] regarding the results in 2 weeks, please contact this office.

## 2016-04-10 ENCOUNTER — Encounter: Payer: Self-pay | Admitting: Physician Assistant

## 2016-04-10 LAB — MICROALBUMIN, URINE: Microalb, Ur: <0.2 mg/dL

## 2016-04-10 NOTE — Progress Notes (Signed)
Prior to Admission medications   Medication Sig Start Date End Date Taking? Authorizing Provider  atorvastatin (LIPITOR) 20 MG tablet TAKE ONE TABLET BY MOUTH ONCE DAILY FOR CHOLESTEROL 04/09/16   Arlee Bossard, PA-C  glipiZIDE-metformin (METAGLIP) 2.5-500 MG tablet Take 1 tablet by mouth 2 (two) times daily before a meal. 04/09/16   Bodee Lafoe, PA-C  lisinopril (PRINIVIL,ZESTRIL) 20 MG tablet Take 1 tablet (20 mg total) by mouth daily. 04/09/16   Daisey Caloca, PA-C  naproxen sodium (ANAPROX DS) 550 MG tablet Take 1 tablet (550 mg total) by mouth 2 (two) times daily with a meal. Patient not taking: Reported on 04/09/2016 09/07/15   Jaynee Eagles, PA-C

## 2016-11-24 IMAGING — CR DG KNEE 1-2V*R*
3 series · 3 of 3 positions shown · non-contrast
Comparison: None in PACs

CLINICAL DATA: Right knee pain likely secondary to chronic kneeling
while painting, some swelling.

EXAM:
RIGHT KNEE - 1-2 VIEW

[AP]
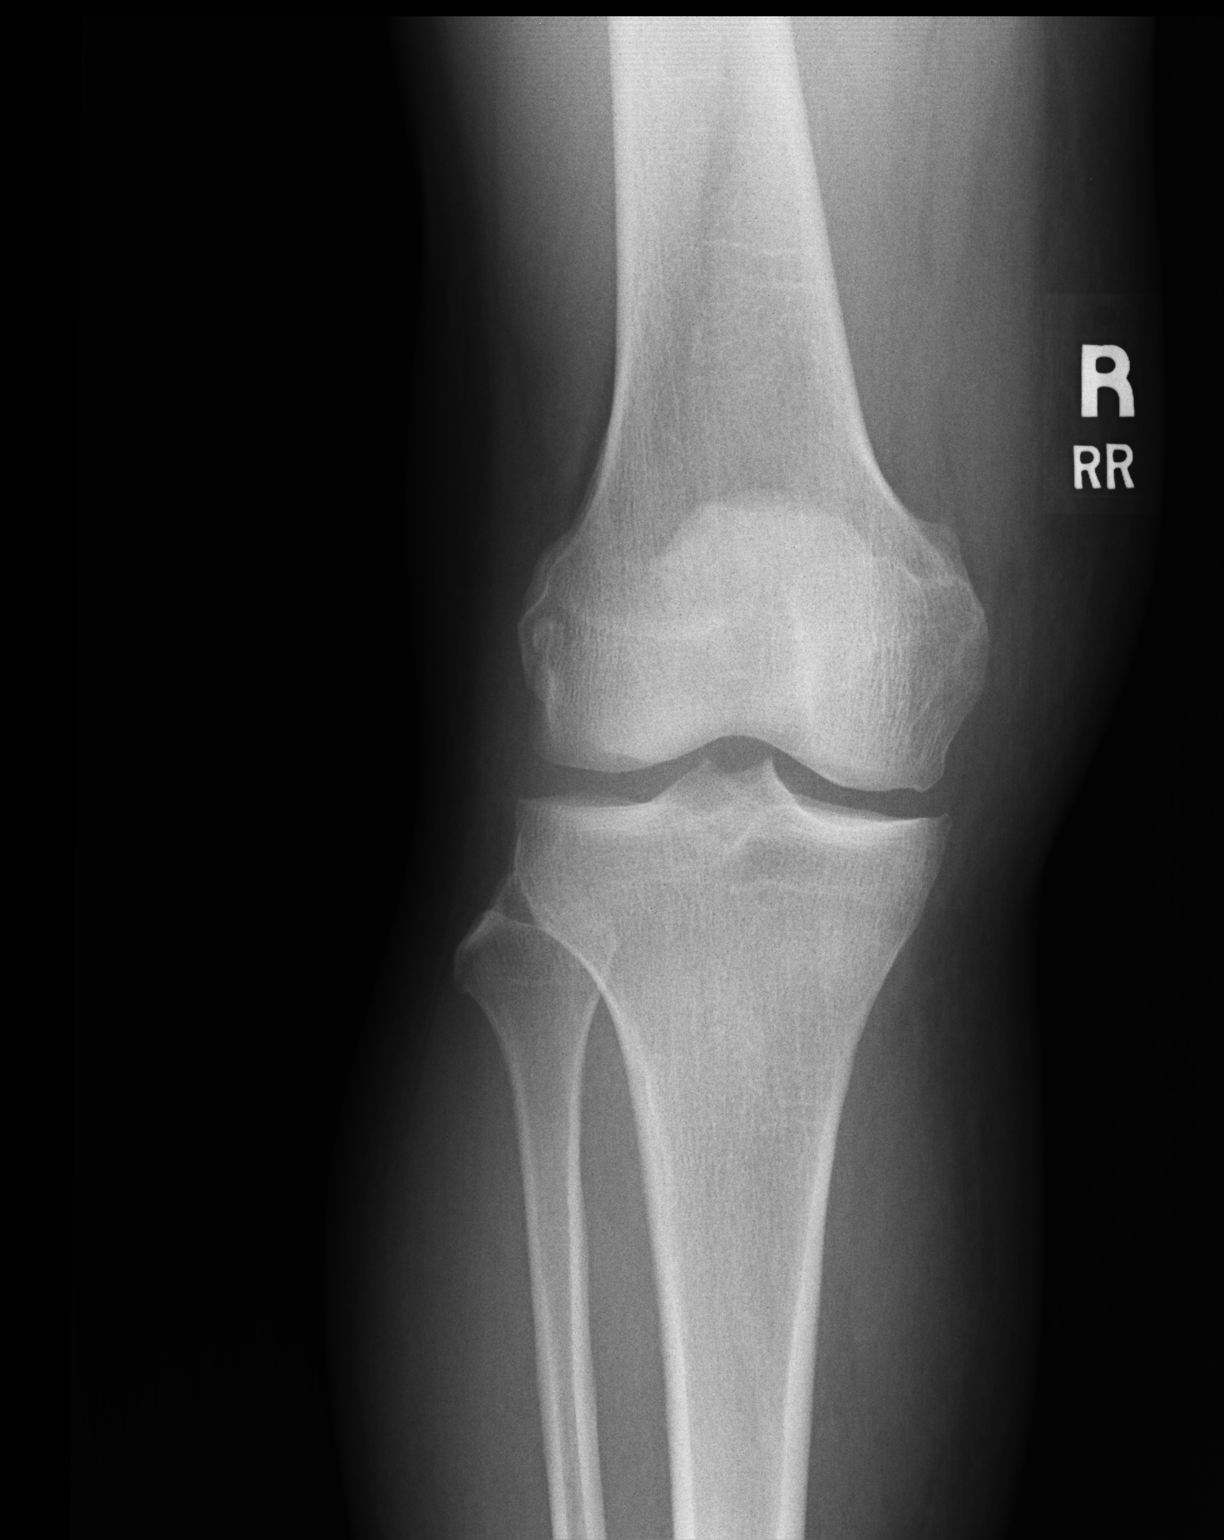

[lateral]
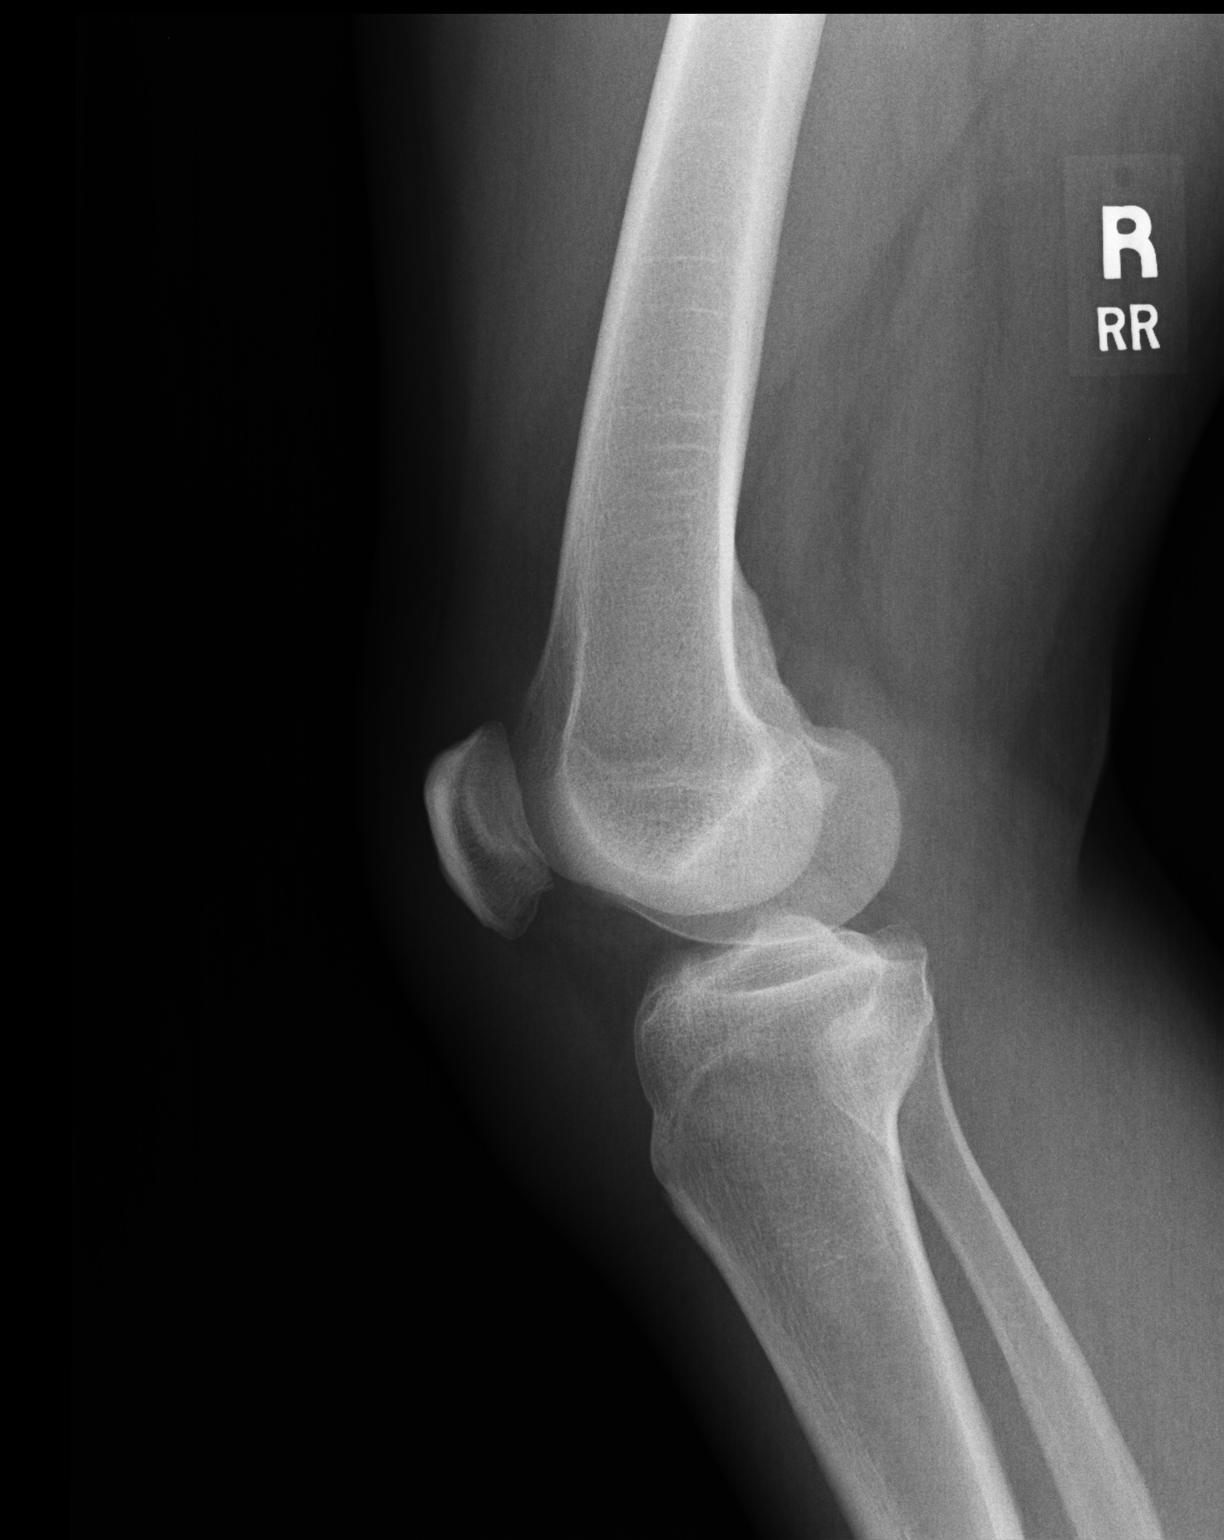

[sunrise]
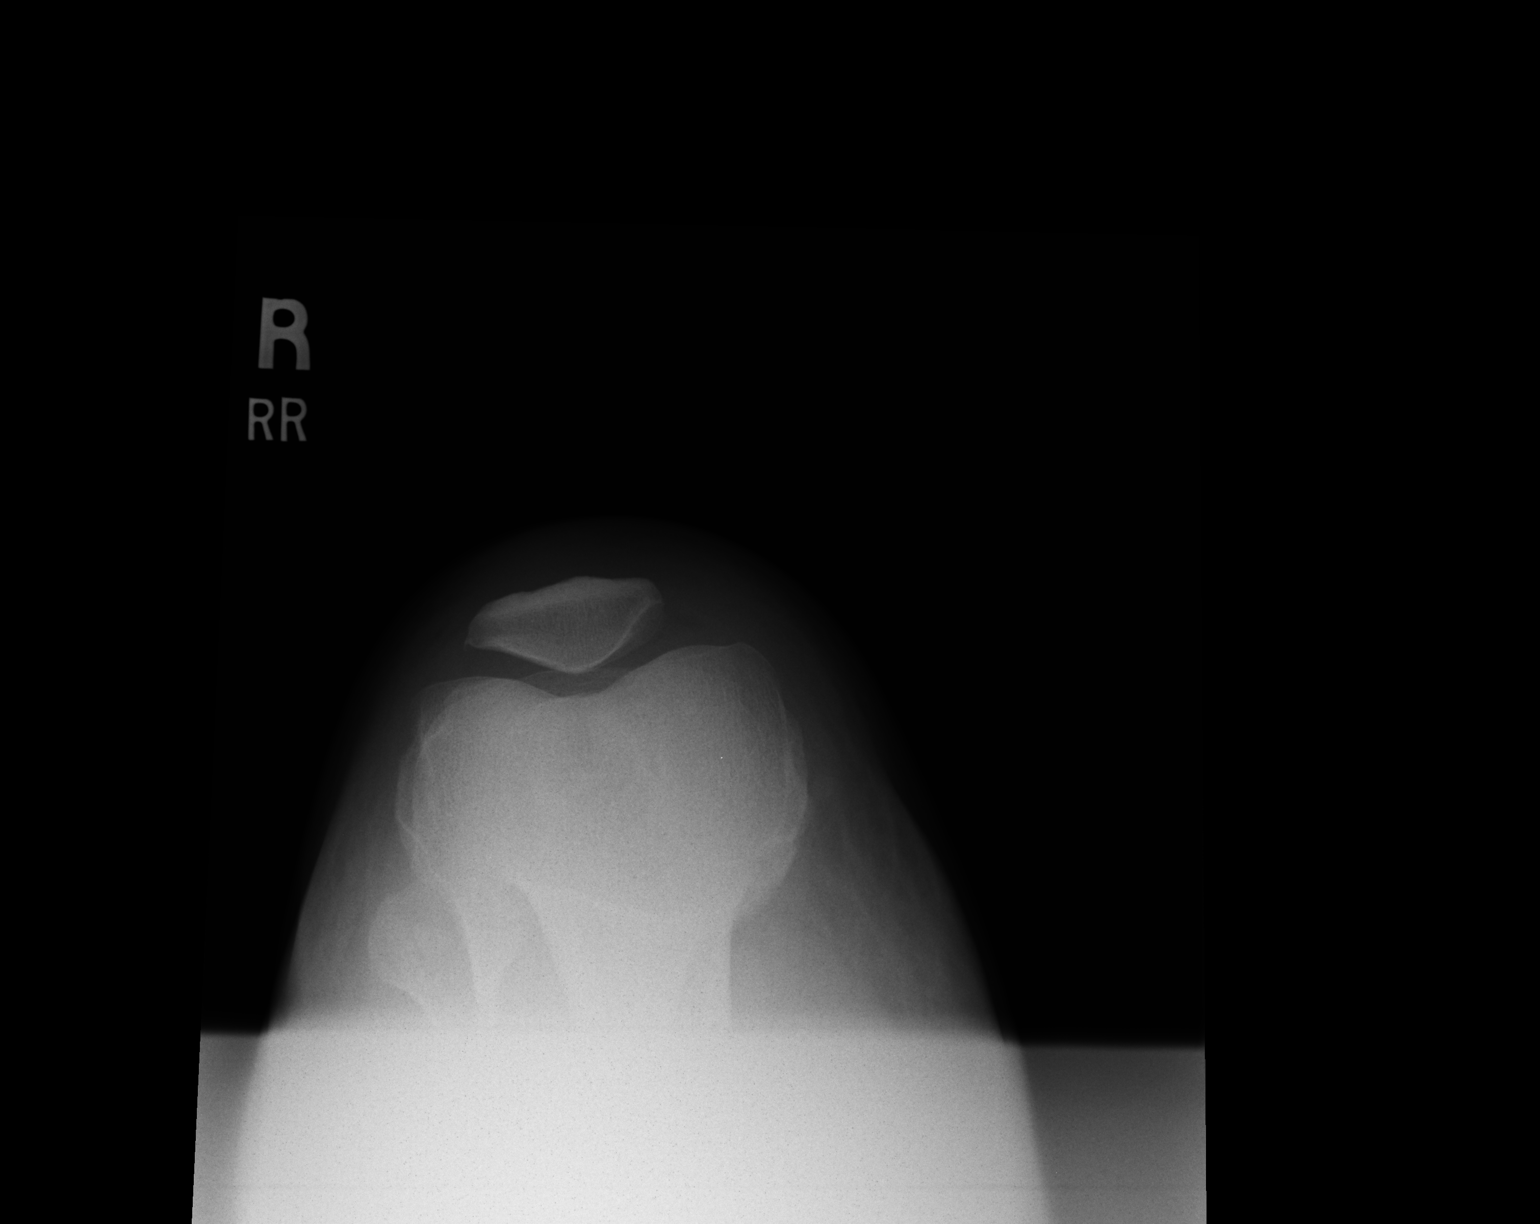

[3 of 3 positions shown; findings below may reference images not displayed]

FINDINGS: The bones of the knee are adequately mineralized. There is beaking
of the tibial spines. There is mild irregularity of the articular
surface of the medial femoral condyles. There is a small spur
arising from the periphery of the medial tibial plateau. There is a
small spur arising from the inferior articular margin of the
patella. There is no significant joint effusion. There is no
fracture or dislocation.
IMPRESSION: There are mild osteoarthritic changes centered on the tibial spines
and medial and patellofemoral compartments.

## 2017-03-07 ENCOUNTER — Ambulatory Visit (INDEPENDENT_AMBULATORY_CARE_PROVIDER_SITE_OTHER): Payer: Self-pay | Admitting: Family Medicine

## 2017-03-07 ENCOUNTER — Encounter: Payer: Self-pay | Admitting: Family Medicine

## 2017-03-07 VITALS — BP 122/80 | HR 79 | Temp 98.9°F | Resp 18 | Ht 63.0 in | Wt 150.0 lb

## 2017-03-07 DIAGNOSIS — E119 Type 2 diabetes mellitus without complications: Secondary | ICD-10-CM

## 2017-03-07 DIAGNOSIS — Z23 Encounter for immunization: Secondary | ICD-10-CM

## 2017-03-07 DIAGNOSIS — I10 Essential (primary) hypertension: Secondary | ICD-10-CM

## 2017-03-07 LAB — POCT URINALYSIS DIP (MANUAL ENTRY)
Bilirubin, UA: NEGATIVE
Glucose, UA: 250 mg/dL — AB
Ketones, POC UA: NEGATIVE mg/dL
Leukocytes, UA: NEGATIVE
Nitrite, UA: NEGATIVE
Protein Ur, POC: NEGATIVE mg/dL
Spec Grav, UA: 1.015 (ref 1.010–1.025)
Urobilinogen, UA: 0.2 E.U./dL
pH, UA: 5.5 (ref 5.0–8.0)

## 2017-03-07 LAB — POCT GLYCOSYLATED HEMOGLOBIN (HGB A1C): Hemoglobin A1C: 9.6

## 2017-03-07 MED ORDER — ATORVASTATIN CALCIUM 20 MG PO TABS: ORAL_TABLET | ORAL | 3 refills | Status: DC

## 2017-03-07 MED ORDER — LISINOPRIL 20 MG PO TABS: 20.0000 mg | ORAL_TABLET | Freq: Every day | ORAL | 3 refills | Status: DC

## 2017-03-07 MED ORDER — GLIPIZIDE-METFORMIN HCL 2.5-500 MG PO TABS: 2.0000 | ORAL_TABLET | Freq: Two times a day (BID) | ORAL | 3 refills | Status: DC

## 2017-03-07 NOTE — Progress Notes (Signed)
10/11/201811:35 AM  Alexander Parker 1961/05/03, 56 y.o. male [id number]  Chief Complaint  Patient presents with  . Diabetes    check     HPI:   Patient is a 56 y.o. male with past medical history significant for DM2 and HTN who presents today for fu,  Last seen nov 2107, a1c 9.7, no changes to meds done. Does not follow diet nor does he exercise. He eats fast for food for lunch everyday at work. He does not check his sugars. His last eye exam was about 2 years ago. He denies any symptoms of hypoglycemia.   He is uninsured, works as a Curator.  Depression screen [facility] 2/9 03/07/2017 04/09/2016 09/07/2015  Decreased Interest 0 0 2  Down, Depressed, Hopeless 0 0 2  PHQ - 2 Score 0 0 4  Altered sleeping - - 2  Tired, decreased energy - - 0  Change in appetite - - 0  Feeling bad or failure about yourself  - - 0  Trouble concentrating - - 1  Moving slowly or fidgety/restless - - 1  Suicidal thoughts - - 0  PHQ-9 Score - - 8  Difficult doing work/chores - - Somewhat difficult    No Known Allergies  Prior to Admission medications   Medication Sig Start Date End Date Taking? Authorizing Provider  atorvastatin (LIPITOR) 20 MG tablet TAKE ONE TABLET BY MOUTH ONCE DAILY FOR CHOLESTEROL 04/09/16  Yes Jeffery, Chelle, PA-C  glipiZIDE-metformin (METAGLIP) 2.5-500 MG tablet Take 1 tablet by mouth 2 (two) times daily before a meal. 04/09/16  Yes Jeffery, Chelle, PA-C  lisinopril (PRINIVIL,ZESTRIL) 20 MG tablet Take 1 tablet (20 mg total) by mouth daily. 04/09/16  Yes Jeffery, Chelle, PA-C  naproxen sodium (ANAPROX DS) 550 MG tablet Take 1 tablet (550 mg total) by mouth 2 (two) times daily with a meal. Patient not taking: Reported on 04/09/2016 09/07/15   Jaynee Eagles, PA-C    Past Medical History:  Diagnosis Date  . Diabetes mellitus without complication ([place])   . Hypertension     No past surgical history on file.  Social History  Substance Use Topics  . Smoking status: Never Smoker   . Smokeless tobacco: Never Used  . Alcohol use No    Family History  Problem Relation Age of Onset  . Cancer Mother   . Diabetes Father     Review of Systems  Constitutional: Negative for chills, fever, malaise/fatigue and weight loss.  HENT: Negative for congestion, ear pain and sore throat.   Eyes: Negative for blurred vision and double vision.  Respiratory: Negative for cough and shortness of breath.   Cardiovascular: Negative for chest pain, palpitations and leg swelling.  Gastrointestinal: Positive for constipation. Negative for abdominal pain, diarrhea, nausea and vomiting.  Genitourinary: Positive for frequency. Negative for dysuria, hematuria and urgency.  Neurological: Negative for tingling and sensory change.  Endo/Heme/Allergies: Negative for polydipsia.     OBJECTIVE:  Blood pressure 122/80, pulse 79, temperature 98.9 F (37.2 C), temperature source Oral, resp. rate 18, height 5' 3" (1.6 m), weight 150 lb (68 kg), SpO2 96 %.  Physical Exam  Constitutional: He is oriented to person, place, and time and well-developed, well-nourished, and in no distress.  HENT:  Head: Normocephalic and atraumatic.  Mouth/Throat: Oropharynx is clear and moist.  Eyes: Pupils are equal, round, and reactive to light. EOM are normal.  Neck: Neck supple. No thyromegaly present.  Cardiovascular: Normal rate, regular rhythm, normal heart sounds and intact distal pulses.  Exam reveals no gallop and no friction rub.   No murmur heard. Pulmonary/Chest: Effort normal and breath sounds normal. He has no wheezes. He has no rales.  Abdominal: Soft. Bowel sounds are normal. He exhibits no distension and no mass. There is no tenderness.  Musculoskeletal: He exhibits no edema.  Neurological: He is alert and oriented to person, place, and time. Gait normal.  Skin: Skin is warm and dry.  Psychiatric: Mood and affect normal.   Diabetic Foot Exam - Simple   Simple Foot Form Visual Inspection No  deformities, no ulcerations, no other skin breakdown bilaterally:  Yes Sensation Testing Intact to touch and monofilament testing bilaterally:  Yes Pulse Check Posterior Tibialis and Dorsalis pulse intact bilaterally:  Yes Comments    Results for orders placed or performed in visit on 03/07/17 (from the past 24 hour(s))  POCT glycosylated hemoglobin (Hb A1C)     Status: None   Collection Time: 03/07/17 11:39 AM  Result Value Ref Range   Hemoglobin A1C 9.6   POCT urinalysis dipstick     Status: Abnormal   Collection Time: 03/07/17 11:52 AM  Result Value Ref Range   Color, UA yellow yellow   Clarity, UA cloudy (A) clear   Glucose, UA =250 (A) negative mg/dL   Bilirubin, UA negative negative   Ketones, POC UA negative negative mg/dL   Spec Grav, UA 1.015 1.010 - 1.025   Blood, UA trace-intact (A) negative   pH, UA 5.5 5.0 - 8.0   Protein Ur, POC negative negative mg/dL   Urobilinogen, UA 0.2 0.2 or 1.0 E.U./dL   Nitrite, UA Negative Negative   Leukocytes, UA Negative Negative    ASSESSMENT and PLAN 1. Type 2 diabetes mellitus without complication, without long-term current use of insulin (HCC) A1c unchanged from last year, above goal. Discussed LFM, patient educational handout given. Increasing medications per below. Strongly encouraged yearly eye exam.  - POCT glycosylated hemoglobin (Hb A1C) - Microalbumin/Creatinine Ratio, Urine - CBC - CMP14+EGFR - Lipid panel - TSH - POCT urinalysis dipstick - glipiZIDE-metformin (METAGLIP) 2.5-500 MG tablet; Take 2 tablets by mouth 2 (two) times daily before a meal. - atorvastatin (LIPITOR) 20 MG tablet; TAKE ONE TABLET BY MOUTH ONCE DAILY FOR CHOLESTEROL - lisinopril (PRINIVIL,ZESTRIL) 20 MG tablet; Take 1 tablet (20 mg total) by mouth daily.  2. Essential hypertension BP at goal, cont current regime - lisinopril (PRINIVIL,ZESTRIL) 20 MG tablet; Take 1 tablet (20 mg total) by mouth daily.  3. Need for influenza vaccination - Flu  Vaccine QUAD 36+ mos IM - given today  Return in about 4 months (around 07/08/2017).    Rutherford Guys, MD [facility] at [place], [place] [address] Ph.  [phone] Fax [phone]

## 2017-03-07 NOTE — Patient Instructions (Addendum)
La diabetes mellitus y los alimentos (Diabetes Mellitus and Food) Es importante que controle su nivel de azcar en la sangre (glucosa). El nivel de glucosa en sangre depende en gran medida de lo que usted come. Comer alimentos saludables en las cantidades [place] a lo largo del Training and development officer, aproximadamente a la misma hora [employer], lo ayudar a Chief Technology Officer su nivel de Multimedia programmer. Tambin puede ayudarlo a retrasar o Patent attorney de la diabetes mellitus. Comer de [employer] saludable incluso puede ayudarlo a Chartered loss adjuster de presin arterial y a Science writer o Theatre manager un peso saludable. Entre las recomendaciones generales para alimentarse y Audiological scientist los alimentos de forma saludable, se incluyen las siguientes:  Respetar las comidas principales y comer colaciones con regularidad. Evitar pasar largos perodos sin comer con el fin de perder peso.  Seguir una dieta que consista principalmente en alimentos de origen vegetal, como frutas, vegetales, frutos secos, legumbres y cereales integrales.  Utilizar mtodos de coccin a baja temperatura, como hornear, en lugar de mtodos de coccin a alta temperatura, como frer en abundante aceite. Trabaje con el nutricionista para aprender a Financial planner nutricional de las etiquetas de los alimentos. CMO PUEDEN AFECTARME LOS ALIMENTOS? Carbohidratos Los carbohidratos afectan el nivel de glucosa en sangre ms que cualquier otro tipo de alimento. El nutricionista lo ayudar a Teacher, adult education cuntos carbohidratos puede consumir en cada comida y ensearle a contarlos. El recuento de carbohidratos es importante para mantener la glucosa en sangre en un nivel saludable, en especial si utiliza insulina o toma determinados medicamentos para la diabetes mellitus. Alcohol El alcohol puede provocar disminuciones sbitas de la glucosa en sangre (hipoglucemia), en especial si utiliza insulina o toma determinados medicamentos para la diabetes mellitus. La  hipoglucemia es una afeccin que puede poner en peligro la vida. Los sntomas de la hipoglucemia (somnolencia, mareos y Data processing manager) son similares a los sntomas de haber consumido mucho alcohol. Si el mdico lo autoriza a beber alcohol, hgalo con moderacin y siga estas pautas:  Las mujeres no deben beber ms de un trago por da, y los hombres no deben beber ms de dos tragos por Training and development officer. Un trago es igual a: ? 12 onzas (355 ml) de cerveza ? 5 onzas de vino (150 ml) de vino ? 1,5onzas (41ml) de bebidas espirituosas  No beba con el estmago vaco.  Mantngase hidratado. Beba agua, gaseosas dietticas o t helado sin azcar.  Las gaseosas comunes, los jugos y otros refrescos podran contener muchos carbohidratos y se Civil Service fast streamer. QU ALIMENTOS NO SE RECOMIENDAN? Cuando haga las elecciones de alimentos, es importante que recuerde que todos los alimentos son distintos. Algunos tienen menos nutrientes que otros por porcin, aunque podran tener la misma cantidad de caloras o carbohidratos. Es difcil darle al cuerpo lo que necesita cuando consume alimentos con menos nutrientes. Estos son algunos ejemplos de alimentos que debera evitar ya que contienen muchas caloras y carbohidratos, pero pocos nutrientes:  Physicist, medical trans (la mayora de los alimentos procesados incluyen grasas trans en la etiqueta de Informacin nutricional).  Gaseosas comunes.  Jugos.  Caramelos.  Dulces, como tortas, pasteles, rosquillas y [place] de [place].  Comidas fritas. QU ALIMENTOS PUEDO COMER? Consuma alimentos ricos en nutrientes, que nutrirn el cuerpo y lo mantendrn saludable. Los alimentos que debe comer tambin dependern de varios factores, como:  Las caloras que necesita.  Los medicamentos que toma.  Su peso.  El nivel de glucosa en [place].  El [place] de presin arterial.  El nivel de colesterol. Debe consumir  una amplia variedad de alimentos, por ejemplo:  Protenas. ? Cortes de [employer]. ? Protenas con bajo contenido de grasas saturadas, como pescado, clara de huevo y frijoles. Evite las carnes procesadas.  Frutas y vegetales. ? [place] y Photographer que pueden ayudar a [employer] niveles sanguneos de [place], como [place], mangos y batatas.  Productos lcteos. ? Elija productos lcteos sin grasa o con bajo contenido de [place], como [place], yogur y [place].  Cereales, panes, pastas y arroz. ? Elija cereales integrales, como panes multicereales, avena en grano y arroz integral. Estos alimentos pueden ayudar a controlar la presin arterial.  Daphene Jaeger. ? Alimentos que contengan grasas saludables, como frutos secos, Musician, aceite de [place], aceite de canola y pescado. TODOS LOS QUE PADECEN DIABETES MELLITUS TIENEN EL [place] PLAN DE [place]? Dado que todas las personas que padecen diabetes mellitus son distintas, no hay un solo plan de comidas que funcione para todos. Es muy importante que se rena con un nutricionista que lo ayudar a crear un plan de comidas adecuado para usted. Esta informacin no tiene Marine scientist el consejo del mdico. Asegrese de hacerle al mdico cualquier pregunta que tenga. Document Released: 08/21/2007 Document Revised: 06/04/2014 Document Reviewed: 04/10/2013 Elsevier Interactive Patient Education  2017 [employer].     IF you received an x-ray today, you will receive an invoice from [facility]. Please contact [facility] at [phone] with questions or concerns regarding your invoice.   IF you received labwork today, you will receive an invoice from [place]. Please contact [facility] at [phone] with questions or concerns regarding your invoice.   Our billing staff will not be able to assist you with questions regarding bills from these companies.  You will be contacted with the lab results as soon as they are available. The fastest way to get your results is to activate your My Chart account. Instructions  are located on the last page of this paperwork. If you have not heard from [place] regarding the results in 2 weeks, please contact this office.

## 2017-03-08 LAB — CMP14+EGFR
ALT: 22 IU/L (ref 0–44)
AST: 17 IU/L (ref 0–40)
Albumin/Globulin Ratio: 1.7 (ref 1.2–2.2)
Albumin: 4.5 g/dL (ref 3.5–5.5)
Alkaline Phosphatase: 86 IU/L (ref 39–117)
BUN/Creatinine Ratio: 22 — ABNORMAL HIGH (ref 9–20)
BUN: 13 mg/dL (ref 6–24)
Bilirubin Total: 0.5 mg/dL (ref 0.0–1.2)
CO2: 24 mmol/L (ref 20–29)
Calcium: 9.7 mg/dL (ref 8.7–10.2)
Chloride: 97 mmol/L (ref 96–106)
Creatinine, Ser: 0.59 mg/dL — ABNORMAL LOW (ref 0.76–1.27)
GFR calc Af Amer: 132 mL/min/{1.73_m2} (ref >59)
GFR calc non Af Amer: 114 mL/min/{1.73_m2} (ref >59)
Globulin, Total: 2.7 g/dL (ref 1.5–4.5)
Glucose: 219 mg/dL — ABNORMAL HIGH (ref 65–99)
Potassium: 4.7 mmol/L (ref 3.5–5.2)
Sodium: 138 mmol/L (ref 134–144)
Total Protein: 7.2 g/dL (ref 6.0–8.5)

## 2017-03-08 LAB — LIPID PANEL
Chol/HDL Ratio: 4.1 ratio (ref 0.0–5.0)
Cholesterol, Total: 158 mg/dL (ref 100–199)
HDL: 39 mg/dL — ABNORMAL LOW (ref >39)
LDL Calculated: 101 mg/dL — ABNORMAL HIGH (ref 0–99)
Triglycerides: 92 mg/dL (ref 0–149)
VLDL Cholesterol Cal: 18 mg/dL (ref 5–40)

## 2017-03-08 LAB — CBC
Hematocrit: 45.1 % (ref 37.5–51.0)
Hemoglobin: 15.3 g/dL (ref 13.0–17.7)
MCH: 29.3 pg (ref 26.6–33.0)
MCHC: 33.9 g/dL (ref 31.5–35.7)
MCV: 86 fL (ref 79–97)
Platelets: 269 10*3/uL (ref 150–379)
RBC: 5.22 x10E6/uL (ref 4.14–5.80)
RDW: 13.3 % (ref 12.3–15.4)
WBC: 6 10*3/uL (ref 3.4–10.8)

## 2017-03-08 LAB — MICROALBUMIN / CREATININE URINE RATIO
Creatinine, Urine: 75.5 mg/dL
Microalb/Creat Ratio: 59.1 mg/g creat — ABNORMAL HIGH (ref 0.0–30.0)
Microalbumin, Urine: 44.6 ug/mL

## 2017-03-08 LAB — TSH: TSH: 1.02 u[IU]/mL (ref 0.450–4.500)

## 2017-03-11 ENCOUNTER — Encounter: Payer: Self-pay | Admitting: Family Medicine

## 2017-03-15 LAB — HM DIABETES EYE EXAM

## 2017-04-12 ENCOUNTER — Telehealth: Payer: Self-pay | Admitting: Urgent Care

## 2017-04-12 DIAGNOSIS — I10 Essential (primary) hypertension: Secondary | ICD-10-CM

## 2017-04-12 DIAGNOSIS — E119 Type 2 diabetes mellitus without complications: Secondary | ICD-10-CM

## 2017-04-12 MED ORDER — LISINOPRIL 20 MG PO TABS: 20.0000 mg | ORAL_TABLET | Freq: Every day | ORAL | 3 refills | Status: DC

## 2017-04-12 MED ORDER — GLIPIZIDE-METFORMIN HCL 2.5-500 MG PO TABS: 2.0000 | ORAL_TABLET | Freq: Two times a day (BID) | ORAL | 3 refills | Status: DC

## 2017-04-12 MED ORDER — ATORVASTATIN CALCIUM 20 MG PO TABS: ORAL_TABLET | ORAL | 3 refills | Status: DC

## 2017-04-12 NOTE — Telephone Encounter (Signed)
LOV 03-07-2017 / Medication refill for lisinopril, atorvastatin, and metaglip  Pharmacy verified   Copied from [place] [phone]. Topic: General - Other >> Apr 12, 2017  1:23 PM Yvette Rack wrote: Reason for CRM: patient would like a refill on lisinopril 20mg  Atorvastatin 20mg  and Metaglip 2.5/500mg  please send to the [facility] [address]

## 2017-10-22 ENCOUNTER — Ambulatory Visit (INDEPENDENT_AMBULATORY_CARE_PROVIDER_SITE_OTHER): Payer: Self-pay | Admitting: Family Medicine

## 2017-10-22 ENCOUNTER — Encounter: Payer: Self-pay | Admitting: Family Medicine

## 2017-10-22 ENCOUNTER — Other Ambulatory Visit: Payer: Self-pay

## 2017-10-22 VITALS — BP 120/70 | HR 97 | Temp 98.7°F | Ht 63.39 in | Wt 151.0 lb

## 2017-10-22 DIAGNOSIS — E119 Type 2 diabetes mellitus without complications: Secondary | ICD-10-CM

## 2017-10-22 LAB — POCT GLYCOSYLATED HEMOGLOBIN (HGB A1C): Hemoglobin A1C: 7.5 % — AB (ref 4.0–5.6)

## 2017-10-22 MED ORDER — GLIPIZIDE 10 MG PO TABS: 10.0000 mg | ORAL_TABLET | Freq: Two times a day (BID) | ORAL | 3 refills | Status: DC

## 2017-10-22 MED ORDER — METFORMIN HCL 1000 MG PO TABS: 1000.0000 mg | ORAL_TABLET | Freq: Two times a day (BID) | ORAL | 3 refills | Status: DC

## 2017-10-22 NOTE — Progress Notes (Signed)
5/28/201910:31 AM  Alexander Parker January 28, 1961, 57 y.o. male [id number]  Chief Complaint  Patient presents with  . Follow-up    DM management, no refills needed at this time    HPI:   Patient is a 57 y.o. male with past medical history significant for DM2 and HTN who presents today for followup  Last a1c 9.6 in Oct 2018 Tolerating new meds well, denies any side effects or low cbgs Went to optho at [place] oct 2018, reports a normal retinal exam Taking all meds as prescribed, denies any side effects He reports he is able to afford his medications Has not had pneumonia vaccine   Fall Risk  10/22/2017 03/07/2017 04/09/2016  Falls in the past year? No No No     Depression screen [facility] 2/9 10/22/2017 03/07/2017 04/09/2016  Decreased Interest 0 0 0  Down, Depressed, Hopeless 0 0 0  PHQ - 2 Score 0 0 0  Altered sleeping - - -  Tired, decreased energy - - -  Change in appetite - - -  Feeling bad or failure about yourself  - - -  Trouble concentrating - - -  Moving slowly or fidgety/restless - - -  Suicidal thoughts - - -  PHQ-9 Score - - -  Difficult doing work/chores - - -    No Known Allergies  Prior to Admission medications   Medication Sig Start Date End Date Taking? Authorizing Provider  atorvastatin (LIPITOR) 20 MG tablet TAKE ONE TABLET BY MOUTH ONCE DAILY FOR CHOLESTEROL 04/12/17  Yes Jaynee Eagles, PA-C  glipiZIDE-metformin (METAGLIP) 2.5-500 MG tablet Take 2 tablets 2 (two) times daily before a meal by mouth. 04/12/17  Yes Jaynee Eagles, PA-C  lisinopril (PRINIVIL,ZESTRIL) 20 MG tablet Take 1 tablet (20 mg total) daily by mouth. 04/12/17  Yes Jaynee Eagles, PA-C  naproxen sodium (ANAPROX DS) 550 MG tablet Take 1 tablet (550 mg total) by mouth 2 (two) times daily with a meal. 09/07/15  Yes Jaynee Eagles, PA-C    Past Medical History:  Diagnosis Date  . Diabetes mellitus without complication ([place])   . Hypertension     History reviewed. No pertinent surgical  history.  Social History   Tobacco Use  . Smoking status: Never Smoker  . Smokeless tobacco: Never Used  Substance Use Topics  . Alcohol use: No    Alcohol/week: 0.0 oz    Family History  Problem Relation Age of Onset  . Cancer Mother   . Diabetes Father     Review of Systems  Constitutional: Negative for chills and fever.  Respiratory: Negative for cough and shortness of breath.   Cardiovascular: Negative for chest pain, palpitations and leg swelling.  Gastrointestinal: Negative for abdominal pain, nausea and vomiting.     OBJECTIVE:  Blood pressure 120/70, pulse 97, temperature 98.7 F (37.1 C), temperature source Oral, height 5' 3.39" (1.61 m), weight 151 lb (68.5 kg), SpO2 98 %.  Wt Readings from Last 3 Encounters:  10/22/17 151 lb (68.5 kg)  03/07/17 150 lb (68 kg)  04/09/16 150 lb 6.4 oz (68.2 kg)    Physical Exam  Constitutional: He is oriented to person, place, and time. He appears well-developed and well-nourished.  HENT:  Head: Normocephalic and atraumatic.  Mouth/Throat: Oropharynx is clear and moist.  Eyes: Pupils are equal, round, and reactive to light. EOM are normal.  Neck: Neck supple.  Cardiovascular: Normal rate and regular rhythm. Exam reveals no gallop and no friction rub.  No murmur heard. Pulmonary/Chest: Effort  normal and breath sounds normal. He has no wheezes. He has no rales.  Neurological: He is alert and oriented to person, place, and time.  Skin: Skin is warm and dry.  Psychiatric: He has a normal mood and affect.  Nursing note and vitals reviewed.   Results for orders placed or performed in visit on 10/22/17 (from the past 24 hour(s))  POCT glycosylated hemoglobin (Hb A1C)     Status: Abnormal   Collection Time: 10/22/17 10:16 AM  Result Value Ref Range   Hemoglobin A1C 7.5 (A) 4.0 - 5.6 %   HbA1c, POC (prediabetic range)  5.7 - 6.4 %   HbA1c, POC (controlled diabetic range)  0.0 - 7.0 %     ASSESSMENT and PLAN  1. Type 2  diabetes mellitus without complication, without long-term current use of insulin (HCC) Improved. titrating glipizide to max dose. Discussed ssx and mgt of hypoglycemia. - POCT glycosylated hemoglobin (Hb A1C)  Health Care maintenance - discussed DOG for immunizations, needs Tdap and pneumonia - discussed cost of annual FOBTIA  Other orders - metFORMIN (GLUCOPHAGE) 1000 MG tablet; Take 1 tablet (1,000 mg total) by mouth 2 (two) times daily with a meal. - glipiZIDE (GLUCOTROL) 10 MG tablet; Take 1 tablet (10 mg total) by mouth 2 (two) times daily before a meal.  Return in about 6 months (around 04/24/2018).    Rutherford Guys, MD Primary Care at [place], [place] [address] Ph.  [phone] Fax [phone]

## 2017-10-22 NOTE — Patient Instructions (Addendum)
   su diabetes esta mejor controlada, su a1c bajo a 7.5 la meta es menos de 7.0 por lo tanto le estoy aumentando la glipizide. Las medicinas (metfomin y glipizide ahora estan sepradas, cada una es una pastilla diferente). tomarse estos medicamentos siempre antes de comer  si empieza a tener bajones de Chief of Staff, reducir la glipizide a 1/2 [employer] al dia.   La vacuna de la pulmonia costaria alrededor de unos $220 dolares, Programmer, applications o ir al departemento de salud  La prueba del cancer de colon (hay que hacerla todos los anos) cuesta alrededor de unos $110.   IF you received an x-ray today, you will receive an invoice from [facility]. Please contact [facility] at [phone] with questions or concerns regarding your invoice.   IF you received labwork today, you will receive an invoice from [place]. Please contact [facility] at [phone] with questions or concerns regarding your invoice.   Our billing staff will not be able to assist you with questions regarding bills from these companies.  You will be contacted with the lab results as soon as they are available. The fastest way to get your results is to activate your My Chart account. Instructions are located on the last page of this paperwork. If you have not heard from [place] regarding the results in 2 weeks, please contact this office.

## 2018-04-22 ENCOUNTER — Ambulatory Visit: Payer: Self-pay | Admitting: Family Medicine

## 2018-05-06 ENCOUNTER — Other Ambulatory Visit: Payer: Self-pay | Admitting: *Deleted

## 2018-05-06 DIAGNOSIS — Z23 Encounter for immunization: Secondary | ICD-10-CM | POA: Diagnosis not present

## 2018-05-06 DIAGNOSIS — E119 Type 2 diabetes mellitus without complications: Secondary | ICD-10-CM

## 2018-05-06 MED ORDER — METFORMIN HCL 1000 MG PO TABS: 1000.0000 mg | ORAL_TABLET | Freq: Two times a day (BID) | ORAL | 0 refills | Status: DC

## 2018-05-06 MED ORDER — GLIPIZIDE 10 MG PO TABS: 10.0000 mg | ORAL_TABLET | Freq: Two times a day (BID) | ORAL | 0 refills | Status: DC

## 2018-05-06 MED ORDER — ATORVASTATIN CALCIUM 20 MG PO TABS: ORAL_TABLET | ORAL | 0 refills | Status: DC

## 2018-05-31 ENCOUNTER — Ambulatory Visit: Payer: BLUE CROSS/BLUE SHIELD | Admitting: Family Medicine

## 2018-06-23 ENCOUNTER — Encounter: Payer: Self-pay | Admitting: Gastroenterology

## 2018-06-23 ENCOUNTER — Encounter: Payer: Self-pay | Admitting: Family Medicine

## 2018-06-23 ENCOUNTER — Other Ambulatory Visit: Payer: Self-pay

## 2018-06-23 ENCOUNTER — Ambulatory Visit (INDEPENDENT_AMBULATORY_CARE_PROVIDER_SITE_OTHER): Payer: BLUE CROSS/BLUE SHIELD | Admitting: Family Medicine

## 2018-06-23 VITALS — BP 138/72 | HR 79 | Temp 98.6°F | Ht 63.0 in | Wt 153.6 lb

## 2018-06-23 DIAGNOSIS — E7800 Pure hypercholesterolemia, unspecified: Secondary | ICD-10-CM

## 2018-06-23 DIAGNOSIS — E119 Type 2 diabetes mellitus without complications: Secondary | ICD-10-CM | POA: Diagnosis not present

## 2018-06-23 DIAGNOSIS — Z794 Long term (current) use of insulin: Secondary | ICD-10-CM | POA: Diagnosis not present

## 2018-06-23 DIAGNOSIS — Z1211 Encounter for screening for malignant neoplasm of colon: Secondary | ICD-10-CM | POA: Diagnosis not present

## 2018-06-23 DIAGNOSIS — Z23 Encounter for immunization: Secondary | ICD-10-CM

## 2018-06-23 DIAGNOSIS — E1165 Type 2 diabetes mellitus with hyperglycemia: Secondary | ICD-10-CM

## 2018-06-23 DIAGNOSIS — I10 Essential (primary) hypertension: Secondary | ICD-10-CM | POA: Diagnosis not present

## 2018-06-23 LAB — POCT GLYCOSYLATED HEMOGLOBIN (HGB A1C): Hemoglobin A1C: 10.2 % — AB (ref 4.0–5.6)

## 2018-06-23 MED ORDER — BLOOD GLUCOSE MONITOR KIT: PACK | 0 refills | Status: DC

## 2018-06-23 MED ORDER — PEN NEEDLES 32G X 6 MM MISC: 1.0000 | Freq: Every day | 5 refills | Status: AC

## 2018-06-23 MED ORDER — INSULIN GLARGINE 100 UNIT/ML SOLOSTAR PEN: 30.0000 [IU] | PEN_INJECTOR | Freq: Every day | SUBCUTANEOUS | 5 refills | Status: DC

## 2018-06-23 NOTE — Progress Notes (Signed)
1/27/20209:44 AM  Alexander Parker 10/04/1960, 58 y.o. male [id number]  Chief Complaint  Patient presents with  . Diabetes    needs refills on all mediation    HPI:   Patient is a 58 y.o. male with past medical history significant for DM2, HLP and HTN who presents today for followup  Last OV May 2019, was supposed to fu in 3 months but did not as he has been traveling out [place] the [place] Glipizide increased to max dose at last visit Does not check cbgs Denies any polydipsia or polyuria Denies any vision changes, due for eye exam, last one in 2019 Denies any burning or pain in feet   Lab Results  Component Value Date   HGBA1C 7.5 (A) 10/22/2017   HGBA1C 9.6 03/07/2017   HGBA1C 9.7 (H) 04/09/2016   Lab Results  Component Value Date   MICROALBUR <0.2 04/09/2016   LDLCALC 101 (H) 03/07/2017   CREATININE 0.59 (L) 03/07/2017    Fall Risk  06/23/2018 10/22/2017 03/07/2017 04/09/2016  Falls in the past year? 0 No No No     Depression screen [facility] 2/9 06/23/2018 10/22/2017 03/07/2017  Decreased Interest 0 0 0  Down, Depressed, Hopeless 0 0 0  PHQ - 2 Score 0 0 0  Altered sleeping - - -  Tired, decreased energy - - -  Change in appetite - - -  Feeling bad or failure about yourself  - - -  Trouble concentrating - - -  Moving slowly or fidgety/restless - - -  Suicidal thoughts - - -  PHQ-9 Score - - -  Difficult doing work/chores - - -    No Known Allergies  Prior to Admission medications   Medication Sig Start Date End Date Taking? Authorizing Provider  atorvastatin (LIPITOR) 20 MG tablet TAKE ONE TABLET BY MOUTH ONCE DAILY FOR CHOLESTEROL 05/06/18  Yes Rutherford Guys, MD  glipiZIDE (GLUCOTROL) 10 MG tablet Take 1 tablet (10 mg total) by mouth 2 (two) times daily before a meal. Office visit needed for refills 05/06/18  Yes Rutherford Guys, MD  lisinopril (PRINIVIL,ZESTRIL) 20 MG tablet Take 1 tablet (20 mg total) daily by mouth. 04/12/17  Yes Jaynee Eagles, PA-C    metFORMIN (GLUCOPHAGE) 1000 MG tablet Take 1 tablet (1,000 mg total) by mouth 2 (two) times daily with a meal. 05/06/18  Yes Rutherford Guys, MD  naproxen sodium (ANAPROX DS) 550 MG tablet Take 1 tablet (550 mg total) by mouth 2 (two) times daily with a meal. 09/07/15  Yes Jaynee Eagles, PA-C    Past Medical History:  Diagnosis Date  . Diabetes mellitus without complication ([place])   . Hyperlipidemia   . Hypertension     History reviewed. No pertinent surgical history.  Social History   Tobacco Use  . Smoking status: Never Smoker  . Smokeless tobacco: Never Used  Substance Use Topics  . Alcohol use: No    Alcohol/week: 0.0 standard drinks    Family History  Problem Relation Age of Onset  . Cancer Mother   . Diabetes Father     Review of Systems  Constitutional: Negative for chills and fever.  Respiratory: Negative for cough and shortness of breath.   Cardiovascular: Negative for chest pain, palpitations and leg swelling.  Gastrointestinal: Negative for abdominal pain, nausea and vomiting.   Per hpi  OBJECTIVE:  Blood pressure 138/72, pulse 79, temperature 98.6 F (37 C), height '5\' 3"'  (1.6 m), weight 153 lb 9.6 oz (69.7 kg),  SpO2 97 %. Body mass index is 27.21 kg/m.    Wt Readings from Last 3 Encounters:  06/23/18 153 lb 9.6 oz (69.7 kg)  10/22/17 151 lb (68.5 kg)  03/07/17 150 lb (68 kg)    Physical Exam Vitals signs and nursing note reviewed.  Constitutional:      Appearance: He is well-developed.  HENT:     Head: Normocephalic and atraumatic.  Eyes:     Conjunctiva/sclera: Conjunctivae normal.     Pupils: Pupils are equal, round, and reactive to light.  Neck:     Musculoskeletal: Neck supple.  Cardiovascular:     Rate and Rhythm: Normal rate and regular rhythm.     Heart sounds: No murmur. No friction rub. No gallop.   Pulmonary:     Effort: Pulmonary effort is normal.     Breath sounds: Normal breath sounds. No wheezing or rales.  Skin:     General: Skin is warm and dry.  Neurological:     Mental Status: He is alert and oriented to person, place, and time.     Results for orders placed or performed in visit on 06/23/18 (from the past 24 hour(s))  POCT glycosylated hemoglobin (Hb A1C)     Status: Abnormal   Collection Time: 06/23/18 10:20 AM  Result Value Ref Range   Hemoglobin A1C 10.2 (A) 4.0 - 5.6 %   HbA1c POC (<> result, manual entry)     HbA1c, POC (prediabetic range)     HbA1c, POC (controlled diabetic range)       ASSESSMENT and PLAN  1. Type 2 diabetes mellitus with hyperglycemia, with long-term current use of insulin (HCC) Uncontrolled. Discussed treatment options. starting lantus. discussed titration, discussed ssx and mgt hypoglycemia. Referring to CDE - Microalbumin / creatinine urine ratio - Lipid panel - TSH - CMP14+EGFR - Ambulatory referral to Ophthalmology - POCT glycosylated hemoglobin (Hb A1C) - Care order/instruction: - Referral to Nutrition and Diabetes Services  2. Essential hypertension Controlled. Continue current regime.   3. Pure hypercholesterolemia Checking labs today, medications will be adjusted as needed.   4. Screening for colon cancer - Ambulatory referral to Gastroenterology  Other orders - Pneumococcal polysaccharide vaccine 23-valent greater than or equal to 2yo subcutaneous/IM - blood glucose meter kit and supplies KIT; Per insurance preference. Use up to three times daily as directed. Dx E11.65, Z79.8. - Insulin Glargine (LANTUS SOLOSTAR) 100 UNIT/ML Solostar Pen; Inject 30 Units into the skin daily. - Insulin Pen Needle (PEN NEEDLES) 32G X 6 MM MISC; 1 each by Does not apply route daily.  Return in about 4 weeks (around 07/21/2018) for insulin use.    Rutherford Guys, MD Primary Care at [place], [place] [address] Ph.  [phone] Fax [phone]

## 2018-06-23 NOTE — Patient Instructions (Addendum)
Empezar lantus 10 unidades cada noche, aumentar 2 unidades cada 3 dias. Meta es Chief of Staff en ayuna entre 90-130   Tratamiento con insulina para la diabetes mellitus Insulin Treatment for Diabetes Mellitus La diabetes (diabetes mellitus) es una enfermedad de larga duracin (crnica). Se produce cuando el cuerpo no utiliza Occupational hygienist (glucosa) que se libera de los alimentos despus de la digestin. Una hormona llamada insulina controla los niveles de glucosa. La insulina se produce en el pncreas, un rgano ubicado detrs del [place].  Si tiene diabetes tipo1, debe recibir insulina porque el pncreas no produce insulina.  Si tiene diabetes tipo2, es posible que tenga que recibir insulina junto con otros medicamentos. En la diabetes tipo 2, puede presentarse uno de los siguientes problemas, o ambos: ? El pncreas no produce la cantidad suficiente de insulina. ? Las clulas del cuerpo no responden de [place] a la insulina que el organismo produce (resistencia a la insulina). Para mantener la diabetes bajo control, tiene que recibir insulina del modo correcto. Debe tener algo de insulina en el organismo en todo momento. El tratamiento con insulina vara en funcin del tipo de diabetes que tenga, los objetivos del tratamiento y los antecedentes mdicos. Haga preguntas para entender su plan de tratamiento con insulina, a fin de que pueda participar activamente en el control de la diabetes. Cmo se administra la insulina? La insulina solo puede administrarse mediante una inyeccin. Se inyecta con [place] y [place], [place] pluma para [place], [place] bomba de [place] o un inyector de insulina tipo jet. Su mdico:  Le recetar el tipo y la cantidad de insulina que necesita.  Le indicar cundo debe inyectarse la insulina. En qu lugar del cuerpo debe inyectarse la insulina? La insulina se inyecta debajo de la piel en una capa de tejido adiposo. Para inyectar la insulina, los lugares  adecuados son los siguientes:  El abdomen. Generalmente, el abdomen es el mejor lugar para Scientist, clinical (histocompatibility and immunogenetics) insulina. Sin embargo, Nurse, adult las zonas que estn a menos de 2pulgadas (5cm) de distancia del ombligo.  La cara anterior del muslo.  La cara superior y externa del muslo.  La cara superior y externa del brazo.  La cara superior y externa de la nalga. Es importante que:  Se aplique la inyeccin en un lugar ligeramente diferente cada vez. Esto ayuda a Dietitian y a Investment banker, operational.  No se aplique las inyecciones en las zonas donde tenga tejido cicatricial. Por lo general, usted mismo se aplicar las inyecciones de insulina. Tambin es posible ensearles a otras personas cmo aplicarlas. Usar un tipo de Academic librarian solo para insulina. Algunas personas pueden usar una bomba que administra insulina de forma continua a travs de un tubo (cnula) que se coloca debajo de la piel. Cules son los diferentes tipos de insulina? La informacin a continuacin es una gua general de los distintos tipos de [place]. Los detalles especficos varan en funcin de la insulina que el mdico le recete.  Insulina de accin rpida: ? Comienza a actuar rpidamente, en apenas 57minutos. ? Su efecto puede durar de 4 a 6horas (o a veces ms tiempo). ? Es eficaz cuando se aplica justo antes de una comida para bajar la glucemia rpidamente.  Insulina de accin corta: ? Comienza a actuar en aproximadamente 76minutos. ? Su efecto puede durar de 6 a 10horas. ? Debe aplicarse aproximadamente 30 minutos antes de empezar a comer.  Insulina de accin intermedia: ? Comienza a actuar en el trmino  de 1 o 2horas. ? Su efecto dura aproximadamente de 10 a 18horas. ? Este tipo de [place], mantiene baja la glucemia durante ms tiempo, pero no es tan eficaz para reducir los niveles justo despus de Scientist, research (physical sciences).  Insulina de accin prolongada: ? Imita la pequea cantidad de insulina que  el pncreas produce habitualmente Agricultural consultant. ? Debe aplicarse una o [place]. ? Por lo general, se utiliza en combinacin con otros tipos de insulina u otros medicamentos.  Insulina concentrada o insulina U-500: ? Contiene una dosis ms alta de insulina que la mayora de las insulinas de accin rpida. La insulina U-500 contiene 5veces la cantidad de insulina por 22ml. ? Se la debe aplicar nicamente con la jeringa especial de U-500 o la pluma para insulina U-500. Con esta insulina, es peligroso usar el tipo equivocado de [place]. Cules son los efectos secundarios de la insulina? Los posibles efectos secundarios del tratamiento con insulina incluyen los siguientes:  Glucemia baja (hipoglucemia).  Aumento de [place].  Glucemia alta (hiperglucemia).  Lesin o irritacin de la piel. Algunos de [employer] secundarios pueden deberse al uso de la tcnica de inyeccin incorrecta. Asegrese de aprender cmo inyectar la insulina correctamente. Cules son los trminos frecuentes asociados con el tratamiento con insulina? Algunos de los trminos que puede or [employer] siguientes:  Insulina basal o tasa basal. Es la cantidad constante de insulina que debe estar presente en el cuerpo para mantener estable el nivel de glucemia. Las personas con diabetes tipo1 necesitan la insulina basal en una dosis constante (continua) las 24horas del Training and development officer. ? Generalmente, la insulina de accin intermedia o la de Company secretary se aplica una o dos veces al da, a fin de [employer] niveles de insulina basal bajo control. ? Tambin se puede recomendar la administracin de medicamentos por va oral para mantener los niveles de insulina basal bajo control.  Insulina prandial o nutricional. Es la insulina relacionada con las comidas. ? La glucemia sube rpidamente despus de una comida (posprandial). Se puede usar la insulina de accin rpida o de accin corta justo antes de una comida  (preprandial) para bajar el nivel de glucemia con rapidez. ? Es posible que le indiquen que ajuste la cantidad de insulina prandial que se administra en funcin de la cantidad de hidratos de carbono (almidones) que ingiera en la comida.  Insulina correctora. Tambin puede denominarse dosis de correccin o complementaria. Se trata de una pequea cantidad de insulina de accin rpida o de accin corta que se puede administrar para Management consultant nivel de glucemia cuando est muy alto. Pueden indicarle que se controle la glucemia en determinados momentos del da y que se aplique insulina correctora si es necesario.  Control estricto o tratamiento intensivo. Consiste en mantener el nivel de glucemia lo ms cerca posible del objetivo y Product/process development scientist que suban demasiado los niveles despus de las comidas. Las personas bajo control estricto de la diabetes tienen menos problemas a largo plazo a causa de esta enfermedad. Siga estas indicaciones en su casa: Hable con el mdico o el farmacutico sobre el tipo de insulina que debe aplicarse y cundo debe [place]. Debe saber cundo la insulina alcanza su valor mximo (pico) y cundo su efecto es ms bajo. Necesita esta informacin para poder planificar las comidas y la actividad fsica. Colabore con su mdico con respecto a lo siguiente:  Manufacturing systems engineer de glucemia todos los [place]. El Buyer, retail la frecuencia y el momento de [place].  Mantenga  bajo control: ? Su peso. ? La presin arterial. ? El colesterol. ? El estrs.  Siga una dieta saludable.  Hacer ejercicio regularmente. Resumen  La diabetes es una enfermedad de larga duracin (crnica). Se produce cuando el cuerpo no utiliza Occupational hygienist (glucosa) que se libera de los alimentos despus de la digestin. Una hormona llamada insulina controla los niveles de glucosa. La insulina se produce en un rgano ubicado detrs del [place]mago (el pncreas).  Para mantener la diabetes bajo control, tiene que  recibir insulina del modo correcto. Debe tener algo de insulina en el organismo en todo momento.  El tratamiento con insulina vara en funcin del tipo de diabetes que tenga, los objetivos del tratamiento y los antecedentes mdicos.  Hable con el mdico o el farmacutico sobre el tipo de insulina que debe aplicarse y cundo debe [place].  Controle su nivel de glucemia todos [place]. El Buyer, retail la frecuencia y el momento de Secretary/administrator. Esta informacin no tiene Marine scientist el consejo del mdico. Asegrese de hacerle al mdico cualquier pregunta que tenga. Document Released: 11/13/2011 Document Revised: 02/12/2017 Document Reviewed: 06/17/2015 Elsevier Interactive Patient Education  [employer].    If you have lab work done today you will be contacted with your lab results within the next 2 weeks.  If you have not heard from [place] then please contact us. The fastest way to get your results is to register for My Chart.   IF you received an x-ray today, you will receive an invoice from [facility]. Please contact [facility] Radiology at [phone] with questions or concerns regarding your invoice.   IF you received labwork today, you will receive an invoice from [place]. Please contact [facility] at [phone] with questions or concerns regarding your invoice.   Our billing staff will not be able to assist you with questions regarding bills from these companies.  You will be contacted with the lab results as soon as they are available. The fastest way to get your results is to activate your My Chart account. Instructions are located on the last page of this paperwork. If you have not heard from [place] regarding the results in 2 weeks, please contact this office.

## 2018-06-24 LAB — CMP14+EGFR
ALT: 51 IU/L — ABNORMAL HIGH (ref 0–44)
AST: 22 IU/L (ref 0–40)
Albumin/Globulin Ratio: 1.7 (ref 1.2–2.2)
Albumin: 4.3 g/dL (ref 3.8–4.9)
Alkaline Phosphatase: 88 IU/L (ref 39–117)
BUN/Creatinine Ratio: 14 (ref 9–20)
BUN: 10 mg/dL (ref 6–24)
Bilirubin Total: 0.4 mg/dL (ref 0.0–1.2)
CO2: 23 mmol/L (ref 20–29)
Calcium: 9.6 mg/dL (ref 8.7–10.2)
Chloride: 97 mmol/L (ref 96–106)
Creatinine, Ser: 0.74 mg/dL — ABNORMAL LOW (ref 0.76–1.27)
GFR calc Af Amer: 119 mL/min/{1.73_m2} (ref >59)
GFR calc non Af Amer: 103 mL/min/{1.73_m2} (ref >59)
Globulin, Total: 2.5 g/dL (ref 1.5–4.5)
Glucose: 272 mg/dL — ABNORMAL HIGH (ref 65–99)
Potassium: 5 mmol/L (ref 3.5–5.2)
Sodium: 135 mmol/L (ref 134–144)
Total Protein: 6.8 g/dL (ref 6.0–8.5)

## 2018-06-24 LAB — LIPID PANEL
Chol/HDL Ratio: 5.3 ratio — ABNORMAL HIGH (ref 0.0–5.0)
Cholesterol, Total: 206 mg/dL — ABNORMAL HIGH (ref 100–199)
HDL: 39 mg/dL — ABNORMAL LOW (ref >39)
LDL Calculated: 135 mg/dL — ABNORMAL HIGH (ref 0–99)
Triglycerides: 162 mg/dL — ABNORMAL HIGH (ref 0–149)
VLDL Cholesterol Cal: 32 mg/dL (ref 5–40)

## 2018-06-24 LAB — TSH: TSH: 1.09 u[IU]/mL (ref 0.450–4.500)

## 2018-06-24 LAB — MICROALBUMIN / CREATININE URINE RATIO
Creatinine, Urine: 35.3 mg/dL
Microalb/Creat Ratio: <8 mg/g creat (ref 0–29)
Microalbumin, Urine: <3 ug/mL

## 2018-06-27 ENCOUNTER — Telehealth: Payer: Self-pay | Admitting: Family Medicine

## 2018-06-27 NOTE — Telephone Encounter (Signed)
Copied from [place]. Topic: General - Other >> Jun 27, 2018 12:52 PM Windy Kalata wrote: Reason for CRM: Patient would like to speak to Dr. Pamella Pert he is Spanish speaking  Best call back is [phone]

## 2018-06-27 NOTE — Telephone Encounter (Signed)
Please advise 

## 2018-06-29 NOTE — Telephone Encounter (Signed)
Called patient Clarified questions

## 2018-07-04 ENCOUNTER — Encounter: Payer: BLUE CROSS/BLUE SHIELD | Attending: Family Medicine | Admitting: Registered"

## 2018-07-04 ENCOUNTER — Encounter: Payer: Self-pay | Admitting: Registered"

## 2018-07-04 VITALS — Ht 63.0 in | Wt 150.5 lb

## 2018-07-04 DIAGNOSIS — E1165 Type 2 diabetes mellitus with hyperglycemia: Secondary | ICD-10-CM | POA: Diagnosis not present

## 2018-07-04 NOTE — Progress Notes (Signed)
Diabetes Self-Management Education  Visit Type: First/Initial  Appt. Start Time: 0800 Appt. End Time: 0867  07/04/2018  [name], identified by name and date of birth, is a 58 y.o. male with a diagnosis of Diabetes: Type 2.   This patient is accompanied in the office by his spouse and [facility] interpreter.  ASSESSMENT  Height 5\' 3"  (1.6 m), weight 150 lb 8 oz (68.3 kg). Body mass index is 26.66 kg/m.   Pt states he started 10 units of insulin 4 days ago and after the second day experienced low BG (50s). Pt reports did not have extreme symptoms, just felt fatigued, also was sweaty during the night. Pt contacted MD and was instructed to reduce insulin to 5 units as well as instructions for treating low blood sugar. Pt reports with change to 5 units last 2 mornings FBG 102 and 82 mg/dL. Pt now has glucose tablets available.  Pt states he has had DM for a long time, but seeing the A1c at 10.2% freaked him out. Pt immediately made changes to his diet, eliminating his daily soda with lunch, changing his bedtime snack from bananas to green apples and reduced tortillas from 6 to 2. Pt states his wife cooks dinner and he eats lunch at [employer], choose wheat bread and lots of vegetables on his sandwich.  Pt's wife states she wanted her husband to drink hibiscus water (sweetened with stevia)  a long time ago, but he just started recently. RD did a quick internet search for interactions and according the [email or web address] may decrease BG and be a risk for low blood sugar when taking DM medications. The way patient describes drinking it not likely contributed to his low BG episodes.  Patient and wife are interested in more education, but did not want to commit to follow-up appointment due to his schedule already full with many MD appointments.  Diabetes Self-Management Education - 07/04/18 6195      Visit Information   Visit Type  First/Initial      Initial Visit   Diabetes Type  Type 2     Are you taking your medications as prescribed?  Yes   2,000 mg metformin 5 u insulin glipizide   Date Diagnosed  ~13 yrs ago      Health Coping   How would you rate your overall health?  Good      Psychosocial Assessment   Patient Belief/Attitude about Diabetes  Afraid    How often do you need to have someone help you when you read instructions, pamphlets, or other written materials from your doctor or pharmacy?  5 - Always    What is the last grade level you completed in school?  6th      Complications   Last HgB A1C per patient/outside source  10.2 %    How often do you check your blood sugar?  1-2 times/day    Fasting Blood glucose range (mg/dL)  70-129;130-179    Number of hypoglycemic episodes per month  2    Can you tell when your blood sugar is low?  Yes    What do you do if your blood sugar is low?  nothing, but has been educated and prepared to treat now    Have you had a dilated eye exam in the past 12 months?  Yes    Have you had a dental exam in the past 12 months?  Yes    Are you checking your feet?  Yes  How many days per week are you checking your feet?  7      Dietary Intake   Breakfast  lactose free milk, wheat bread OR oatmeal prepared with water    Snack (morning)  none     Lunch  chicken or [place] 6" sandwich with vegetables, no cheese, chick-fil-a OR subway, hotsauce, picante, sugar free tea    Snack (afternoon)  none    Dinner  roasted chicken, nopal (vegetable) chicpeas, 2 toritillas, beans,      Snack (evening)  green apple, strawberries    Beverage(s)  water, unsweet tea      Exercise   Exercise Type  Light (walking / raking leaves)    How many days per week to you exercise?  3    How many minutes per day do you exercise?  24    Total minutes per week of exercise  72      Patient Education   Previous Diabetes Education  No    Nutrition management   Reviewed blood glucose goals for pre and post meals and how to evaluate the patients' food intake  on their blood glucose level.;Role of diet in the treatment of diabetes and the relationship between the three main macronutrients and blood glucose level    Monitoring  Identified appropriate SMBG and/or A1C goals.    Acute complications  Taught treatment of hypoglycemia - the 15 rule.      Individualized Goals (developed by patient)   Nutrition  General guidelines for healthy choices and portions discussed    Medications  take my medication as prescribed    Monitoring   test my blood glucose as discussed      Outcomes   Expected Outcomes  Demonstrated interest in learning. Expect positive outcomes    Future DMSE  PRN    Program Status  Not Completed      Individualized Plan for Diabetes Self-Management Training:   Learning Objective:  Patient will have a greater understanding of diabetes self-management. Patient education plan is to attend individual and/or group sessions per assessed needs and concerns.   Patient Instructions  Aim to eat balanced meals and snack, remember to eat protein with fruit Treat low blood sugar with 15 grams of carbohydrates You can check your blood sugar 2 hours after a meal to see how it affected your blood sugar  Healthy Eating plate [email or web address]  Expected Outcomes:  Demonstrated interest in learning. Expect positive outcomes  Education material provided: [employer] (Spanish), Low Blood Sugar (Spanish)  If problems or questions, patient to contact team via:  Phone  Future DSME appointment: PRN

## 2018-07-04 NOTE — Patient Instructions (Addendum)
Aim to eat balanced meals and snack, remember to eat protein with fruit Treat low blood sugar with 15 grams of carbohydrates You can check your blood sugar 2 hours after a meal to see how it affected your blood sugar  Healthy Eating plate [email or web address]

## 2018-07-09 ENCOUNTER — Encounter: Payer: Self-pay | Admitting: Gastroenterology

## 2018-07-09 ENCOUNTER — Ambulatory Visit (AMBULATORY_SURGERY_CENTER): Payer: Self-pay | Admitting: *Deleted

## 2018-07-09 VITALS — Ht 63.0 in | Wt 152.0 lb

## 2018-07-09 DIAGNOSIS — Z1211 Encounter for screening for malignant neoplasm of colon: Secondary | ICD-10-CM

## 2018-07-09 MED ORDER — NA SULFATE-K SULFATE-MG SULF 17.5-3.13-1.6 GM/177ML PO SOLN: 1.0000 | Freq: Once | ORAL | 0 refills | Status: AC

## 2018-07-09 NOTE — Progress Notes (Signed)
No egg or soy allergy known to patient  No issues with past sedation with any surgeries  or procedures, no intubation problems  No diet pills per patient No home 02 use per patient  No blood thinners per patient  Pt denies issues with constipation  No A fib or A flutter  EMMI video sent to pt's e mail - pt declined  Suprep $50 PNM to pt in [place] today Interpreter in [place] with pt today

## 2018-07-21 ENCOUNTER — Ambulatory Visit: Payer: BLUE CROSS/BLUE SHIELD | Admitting: Family Medicine

## 2018-07-21 ENCOUNTER — Encounter: Payer: Self-pay | Admitting: Family Medicine

## 2018-07-21 ENCOUNTER — Other Ambulatory Visit: Payer: Self-pay

## 2018-07-21 VITALS — BP 127/81 | HR 68 | Temp 97.7°F | Ht 63.0 in | Wt 151.2 lb

## 2018-07-21 DIAGNOSIS — E7800 Pure hypercholesterolemia, unspecified: Secondary | ICD-10-CM | POA: Diagnosis not present

## 2018-07-21 DIAGNOSIS — I10 Essential (primary) hypertension: Secondary | ICD-10-CM

## 2018-07-21 DIAGNOSIS — E11649 Type 2 diabetes mellitus with hypoglycemia without coma: Secondary | ICD-10-CM

## 2018-07-21 DIAGNOSIS — E119 Type 2 diabetes mellitus without complications: Secondary | ICD-10-CM

## 2018-07-21 MED ORDER — ATORVASTATIN CALCIUM 40 MG PO TABS: ORAL_TABLET | ORAL | 3 refills | Status: DC

## 2018-07-21 NOTE — Progress Notes (Signed)
2/24/202012:11 PM  Alexander Parker May 31, 1960, 58 y.o. male [id number]  Chief Complaint  Patient presents with  . Diabetes    insulin use follow up, expresses comfort with giving himself the injection. Pt checked glucose this morning says it was 39, got up to 68 after drinking a soda. Thinks he may be using too much. Has colonoscopy tomorrow some meds he is not taking in prep for the test    HPI:   Patient is a 58 y.o. male with past medical history significant for DM2, HLP and HTN who presents today for routine followup  Last OV jan 2020 Started on lantus for A1c > 10  He has been doing lantus 4 units  Last night used 2.5 units of lantus This morning had cbg 39, drank about 4 ounces, of coke and gatorade Tomorrow scheduled for colonoscopy, starting liquid diet today Brings in readings - fasting and redinner- many < 90.  He has cut back significantly on diet Has also started running his stationary bike  Lab Results  Component Value Date   HGBA1C 10.2 (A) 06/23/2018   HGBA1C 7.5 (A) 10/22/2017   HGBA1C 9.6 03/07/2017   Lab Results  Component Value Date   MICROALBUR <0.2 04/09/2016   LDLCALC 135 (H) 06/23/2018   CREATININE 0.74 (L) 06/23/2018    Fall Risk  07/21/2018 07/04/2018 06/23/2018 10/22/2017 03/07/2017  Falls in the past year? 0 0 0 No No  Number falls in past yr: 0 - - - -  Injury with Fall? 0 - - - -     Depression screen [facility] 2/9 07/21/2018 07/04/2018 06/23/2018  Decreased Interest 0 0 0  Down, Depressed, Hopeless 0 0 0  PHQ - 2 Score 0 0 0  Altered sleeping - - -  Tired, decreased energy - - -  Change in appetite - - -  Feeling bad or failure about yourself  - - -  Trouble concentrating - - -  Moving slowly or fidgety/restless - - -  Suicidal thoughts - - -  PHQ-9 Score - - -  Difficult doing work/chores - - -    No Known Allergies  Prior to Admission medications   Medication Sig Start Date End Date Taking? Authorizing Provider  atorvastatin (LIPITOR)  20 MG tablet TAKE ONE TABLET BY MOUTH ONCE DAILY FOR CHOLESTEROL 05/06/18   Rutherford Guys, MD  blood glucose meter kit and supplies KIT Per insurance preference. Use up to three times daily as directed. Dx E11.65, Z79.8. 06/23/18   Rutherford Guys, MD  CONTOUR NEXT TEST test strip USE TO TEST UP TO THREE TIMES DAILY AS DIRECTED 06/25/18   [provider]  glipiZIDE (GLUCOTROL) 10 MG tablet Take 1 tablet (10 mg total) by mouth 2 (two) times daily before a meal. Office visit needed for refills 05/06/18   Rutherford Guys, MD  Insulin Glargine (LANTUS SOLOSTAR) 100 UNIT/ML Solostar Pen Inject 30 Units into the skin daily. Patient taking differently: Inject 30 Units into the skin daily.  06/23/18   Rutherford Guys, MD  Insulin Pen Needle (PEN NEEDLES) 32G X 6 MM MISC 1 each by Does not apply route daily. 06/23/18   Rutherford Guys, MD  lisinopril (PRINIVIL,ZESTRIL) 20 MG tablet Take 1 tablet (20 mg total) daily by mouth. 04/12/17   Jaynee Eagles, PA-C  metFORMIN (GLUCOPHAGE) 1000 MG tablet Take 1 tablet (1,000 mg total) by mouth 2 (two) times daily with a meal. 05/06/18   Rutherford Guys, MD  MICROLET LANCETS MISC USE TO TEST UP TO THREE TIMES A DAY AS DIRECTED 06/25/18   [provider]    Past Medical History:  Diagnosis Date  . Diabetes mellitus without complication ([place])   . Hyperlipidemia   . Hypertension     Past Surgical History:  Procedure Laterality Date  . CIRCUMCISION      Social History   Tobacco Use  . Smoking status: Never Smoker  . Smokeless tobacco: Never Used  Substance Use Topics  . Alcohol use: No    Alcohol/week: 0.0 standard drinks    Family History  Problem Relation Age of Onset  . Diabetes Father   . Esophageal cancer Maternal Uncle   . Colon cancer Neg Hx   . Colon polyps Neg Hx   . Rectal cancer Neg Hx   . Stomach cancer Neg Hx     ROS Per hpi  OBJECTIVE:  Blood pressure 127/81, pulse 68, temperature 97.7 F (36.5 C),  temperature source Oral, height _0  (1.6 m), weight 151 lb 3.2 oz (68.6 kg), SpO2 95 %. Body mass index is 26.78 kg/m.   Physical Exam Vitals signs and nursing note reviewed.  Constitutional:      Appearance: He is well-developed.  HENT:     Head: Normocephalic and atraumatic.  Eyes:     Conjunctiva/sclera: Conjunctivae normal.     Pupils: Pupils are equal, round, and reactive to light.  Neck:     Musculoskeletal: Neck supple.  Pulmonary:     Effort: Pulmonary effort is normal.  Skin:    General: Skin is warm and dry.  Neurological:     Mental Status: He is alert and oriented to person, place, and time.     ASSESSMENT and PLAN  1. Hypoglycemia associated with type 2 diabetes mellitus ([place]) Discussed stop lantus. Continue cbg monitoring. Cont with LFM, discussed might need to decrease glipizide to 1/2 tab BID if cbgs < 90 continue.  Discussed mgt of hypoglycemia.  2. Pure hypercholesterolemia Uncontrolled. Increase atorvastatin to 39m once a day  3. Essential hypertension Controlled. Continue current regime.   4. Type 2 diabetes mellitus without complication, without long-term current use of insulin (HCC) - atorvastatin (LIPITOR) 40 MG tablet; TAKE ONE TABLET BY MOUTH ONCE DAILY FOR CHOLESTEROL  Return in about 4 weeks (around 08/18/2018).    IRutherford Guys MD [facility] [place] [id number].  [phone] [phone]

## 2018-07-21 NOTE — Patient Instructions (Addendum)
  Parar de Financial risk analyst (lantus) continuar chequeandose la Biomedical scientist y 2 horas despues de la cena ayuna azucar debe de estar entre 90-130 2 horas despues de la [place] 160-180    If you have lab work done today you will be contacted with your lab results within the next 2 weeks.  If you have not heard from [place] then please contact us. The fastest way to get your results is to register for My Chart.   IF you received an x-ray today, you will receive an invoice from [facility] Radiology. Please contact [facility] Radiology at [phone] with questions or concerns regarding your invoice.   IF you received labwork today, you will receive an invoice from [place]. Please contact [facility] at [phone] with questions or concerns regarding your invoice.   Our billing staff will not be able to assist you with questions regarding bills from these companies.  You will be contacted with the lab results as soon as they are available. The fastest way to get your results is to activate your My Chart account. Instructions are located on the last page of this paperwork. If you have not heard from [place] regarding the results in 2 weeks, please contact this office.

## 2018-07-22 ENCOUNTER — Encounter: Payer: Self-pay | Admitting: Gastroenterology

## 2018-07-22 ENCOUNTER — Ambulatory Visit (AMBULATORY_SURGERY_CENTER): Payer: BLUE CROSS/BLUE SHIELD | Admitting: Gastroenterology

## 2018-07-22 VITALS — BP 109/75 | HR 68 | Temp 97.3°F | Resp 15 | Ht 63.0 in | Wt 152.0 lb

## 2018-07-22 DIAGNOSIS — D124 Benign neoplasm of descending colon: Secondary | ICD-10-CM | POA: Diagnosis not present

## 2018-07-22 DIAGNOSIS — D123 Benign neoplasm of transverse colon: Secondary | ICD-10-CM

## 2018-07-22 DIAGNOSIS — Z1211 Encounter for screening for malignant neoplasm of colon: Secondary | ICD-10-CM | POA: Diagnosis not present

## 2018-07-22 MED ORDER — SODIUM CHLORIDE 0.9 % IV SOLN: 500.0000 mL | Freq: Once | INTRAVENOUS | Status: DC

## 2018-07-22 NOTE — Progress Notes (Signed)
Report given to PACU, vss 

## 2018-07-22 NOTE — Progress Notes (Signed)
Pt's states no medical or surgical changes since previsit or office visit. 

## 2018-07-22 NOTE — Patient Instructions (Signed)
Please read handouts on provided. Await pathology results. Continue present medications.     YOU HAD AN ENDOSCOPIC PROCEDURE TODAY AT [facility]:   Refer to the procedure report that was given to you for any specific questions about what was found during the examination.  If the procedure report does not answer your questions, please call your gastroenterologist to clarify.  If you requested that your care partner not be given the details of your procedure findings, then the procedure report has been included in a sealed envelope for you to review at your convenience later.  YOU SHOULD EXPECT: Some feelings of bloating in the abdomen. Passage of more gas than usual.  Walking can help get rid of the air that was put into your GI tract during the procedure and reduce the bloating. If you had a lower endoscopy (such as a colonoscopy or flexible sigmoidoscopy) you may notice spotting of blood in your stool or on the toilet paper. If you underwent a bowel prep for your procedure, you may not have a normal bowel movement for a few days.  Please Note:  You might notice some irritation and congestion in your nose or some drainage.  This is from the oxygen used during your procedure.  There is no need for concern and it should clear up in a day or so.  SYMPTOMS TO REPORT IMMEDIATELY:   Following lower endoscopy (colonoscopy or flexible sigmoidoscopy):  Excessive amounts of blood in the stool  Significant tenderness or worsening of abdominal pains  Swelling of the abdomen that is new, acute  Fever of 100F or higher    For urgent or emergent issues, a gastroenterologist can be reached at any hour by calling [phone].   DIET:  We do recommend a small meal at first, but then you may proceed to your regular diet.  Drink plenty of fluids but you should avoid alcoholic beverages for 24 hours.  ACTIVITY:  You should plan to take it easy for the rest of today and you should NOT  DRIVE or use heavy machinery until tomorrow (because of the sedation medicines used during the test).    FOLLOW UP: Our staff will call the number listed on your records the next business day following your procedure to check on you and address any questions or concerns that you may have regarding the information given to you following your procedure. If we do not reach you, we will leave a message.  However, if you are feeling well and you are not experiencing any problems, there is no need to return our call.  We will assume that you have returned to your regular daily activities without incident.  If any biopsies were taken you will be contacted by phone or by letter within the next 1-3 weeks.  Please call us at [phone] if you have not heard about the biopsies in 3 weeks.    SIGNATURES/CONFIDENTIALITY: You and/or your care partner have signed paperwork which will be entered into your electronic medical record.  These signatures attest to the fact that that the information above on your After Visit Summary has been reviewed and is understood.  Full responsibility of the confidentiality of this discharge information lies with you and/or your care-partner.

## 2018-07-22 NOTE — Op Note (Signed)
[place] Patient Name: Alexander Parker Procedure Date: 07/22/2018 8:57 AM MRN: [id number] Endoscopist: Thornton Park MD, MD Age: 58 Referring MD:  Date of Birth: April 11, 1961 Gender: Male Account #: [id number] Procedure:                Colonoscopy Indications:              Screening for colorectal malignant neoplasm, This                            is the patient's first colonoscopy. No known family                            history of colon cancer or polyps. Chronic                            constipation. No other baseine GI symptoms. Medicines:                See the Anesthesia note for documentation of the                            administered medications Procedure:                Pre-Anesthesia Assessment:                           - Prior to the procedure, a History and Physical                            was performed, and patient medications and                            allergies were reviewed. The patient's tolerance of                            previous anesthesia was also reviewed. The risks                            and benefits of the procedure and the sedation                            options and risks were discussed with the patient.                            All questions were answered, and informed consent                            was obtained. Prior Anticoagulants: The patient has                            taken no previous anticoagulant or antiplatelet                            agents. ASA Grade Assessment: II - A patient with  mild systemic disease. After reviewing the risks                            and benefits, the patient was deemed in                            satisfactory condition to undergo the procedure.                           After obtaining informed consent, the colonoscope                            was passed under direct vision. Throughout the                            procedure, the patient's  blood pressure, pulse, and                            oxygen saturations were monitored continuously. The                            Colonoscope was introduced through the anus and                            advanced to the the terminal ileum, with                            identification of the appendiceal orifice and IC                            valve. The colonoscopy was performed without                            difficulty. The patient tolerated the procedure                            well. The quality of the bowel preparation was                            excellent. The terminal ileum, ileocecal valve,                            appendiceal orifice, and rectum were photographed. Scope In: 9:07:24 AM Scope Out: 9:19:43 AM Scope Withdrawal Time: 0 hours 9 minutes 34 seconds  Total Procedure Duration: 0 hours 12 minutes 19 seconds  Findings:                 The perianal and digital rectal examinations were                            normal.                           A few small-mouthed diverticula were found in the  sigmoid colon.                           A 4 mm polyp was found in the distal transverse                            colon. The polyp was sessile. The polyp was removed                            with a cold snare. Resection and retrieval were                            complete. Estimated blood loss was minimal.                           A 8 mm polyp was found in the descending colon. The                            polyp was sessile. The polyp was removed with a                            cold snare. Resection and retrieval were complete.                            Estimated blood loss was minimal.                           The exam was otherwise without abnormality on                            direct and retroflexion views. Complications:            No immediate complications. Estimated blood loss:                             Minimal. Estimated Blood Loss:     Estimated blood loss was minimal. Impression:               - Diverticulosis in the sigmoid colon.                           - One 4 mm polyp in the distal transverse colon,                            removed with a cold snare. Resected and retrieved.                           - One 8 mm polyp in the descending colon, removed                            with a cold snare. Resected and retrieved.                           - The examination was otherwise normal on direct  and retroflexion views. Recommendation:           - Patient has a contact number available for                            emergencies. The signs and symptoms of potential                            delayed complications were discussed with the                            patient. Return to normal activities tomorrow.                            Written discharge instructions were provided to the                            patient.                           - Resume previous diet today. High fiber diet                            recommended.                           - Continue present medications.                           - Await pathology results.                           - Repeat colonoscopy in 7 years for surveillance. Thornton Park MD, MD 07/22/2018 9:28:22 AM This report has been signed electronically.

## 2018-07-22 NOTE — Progress Notes (Signed)
Called to room to assist during endoscopic procedure.  Patient ID and intended procedure confirmed with present staff. Received instructions for my participation in the procedure from the performing physician.  

## 2018-07-23 ENCOUNTER — Telehealth: Payer: Self-pay

## 2018-07-23 NOTE — Telephone Encounter (Signed)
Left message, will try again a little later today.

## 2018-07-23 NOTE — Telephone Encounter (Signed)
  Follow up Call-  Call back number 07/22/2018  Post procedure Call Back phone  # [phone]  Permission to leave phone message Yes  Some recent data might be hidden     Patient questions:  Do you have a fever, pain , or abdominal swelling? No. Pain Score  0 *  Have you tolerated food without any problems? Yes.    Have you been able to return to your normal activities? Yes.    Do you have any questions about your discharge instructions: Diet   No. Medications  No. Follow up visit  No.  Do you have questions or concerns about your Care? No.  Actions: * If pain score is 4 or above: No action needed, pain <4.

## 2018-07-24 ENCOUNTER — Encounter: Payer: Self-pay | Admitting: Gastroenterology

## 2018-07-27 ENCOUNTER — Other Ambulatory Visit: Payer: Self-pay | Admitting: Urgent Care

## 2018-07-27 DIAGNOSIS — E119 Type 2 diabetes mellitus without complications: Secondary | ICD-10-CM

## 2018-07-27 DIAGNOSIS — I10 Essential (primary) hypertension: Secondary | ICD-10-CM

## 2018-08-03 ENCOUNTER — Other Ambulatory Visit: Payer: Self-pay | Admitting: Family Medicine

## 2018-08-04 NOTE — Telephone Encounter (Signed)
Requested medication (s) are due for refill today: yes  Requested medication (s) are on the active medication list: yes  Last refill:  06/25/18 for both  Future visit scheduled: yes  Notes to clinic:  meds are both by historical provider.  Requested Prescriptions  Pending Prescriptions Disp Refills   CONTOUR NEXT TEST test strip [Pharmacy Med Name: Contour Next Test In Vitro Strip] 100 each 0    Sig: USE TO TEST UP TO THREE TIMES DAILY AS DIRECTED     Endocrinology: Diabetes - Testing Supplies Passed - 08/03/2018  5:00 PM      Passed - Valid encounter within last 12 months    Recent Outpatient Visits          2 weeks ago Hypoglycemia associated with type 2 diabetes mellitus ([place])   [facility], Lilia Argue, MD   1 month ago Type 2 diabetes mellitus with hyperglycemia, with long-term current use of insulin [facility])   [facility], Lilia Argue, MD   9 months ago Type 2 diabetes mellitus without complication, without long-term current use of insulin [facility])   [facility] at [facility], Lilia Argue, MD   1 year ago Type 2 diabetes mellitus without complication, without long-term current use of insulin [facility])   [facility] at [facility], Lilia Argue, MD   2 years ago Type 2 diabetes mellitus without complication, without long-term current use of insulin [facility])   Primary Care at [facility], [place], [place]      Future Appointments            In 2 weeks Pamella Pert, Lilia Argue, MD Primary Care at [place], [place] [Pharmacy Med Name: MICROLET LANCETS MIS] 100 each 0    Sig: USE TO TEST UP TO THREE TIMES A DAY AS DIRECTED     Endocrinology: Diabetes - Testing Supplies Passed - 08/03/2018  5:00 PM      Passed - Valid encounter within last 12 months    Recent Outpatient Visits          2 weeks ago Hypoglycemia associated with type 2 diabetes mellitus ([place])   [facility], Lilia Argue, MD   1 month ago Type 2 diabetes mellitus  with hyperglycemia, with long-term current use of insulin [facility])   [facility] at [facility], Lilia Argue, MD   9 months ago Type 2 diabetes mellitus without complication, without long-term current use of insulin [facility])   [facility] at [facility], Lilia Argue, MD   1 year ago Type 2 diabetes mellitus without complication, without long-term current use of insulin [facility])   [facility] at [facility], Lilia Argue, MD   2 years ago Type 2 diabetes mellitus without complication, without long-term current use of insulin [facility])   Primary Care at [facility], [place], [place]      Future Appointments            In 2 weeks Rutherford Guys, MD Primary Care at [place], [facility]

## 2018-08-16 ENCOUNTER — Other Ambulatory Visit: Payer: Self-pay | Admitting: Family Medicine

## 2018-08-19 ENCOUNTER — Telehealth (INDEPENDENT_AMBULATORY_CARE_PROVIDER_SITE_OTHER): Payer: BLUE CROSS/BLUE SHIELD | Admitting: Family Medicine

## 2018-08-19 DIAGNOSIS — I10 Essential (primary) hypertension: Secondary | ICD-10-CM | POA: Diagnosis not present

## 2018-08-19 DIAGNOSIS — E119 Type 2 diabetes mellitus without complications: Secondary | ICD-10-CM | POA: Diagnosis not present

## 2018-08-19 DIAGNOSIS — E7800 Pure hypercholesterolemia, unspecified: Secondary | ICD-10-CM | POA: Diagnosis not present

## 2018-08-19 MED ORDER — LISINOPRIL 20 MG PO TABS: 20.0000 mg | ORAL_TABLET | Freq: Every day | ORAL | 1 refills | Status: DC

## 2018-08-19 MED ORDER — METFORMIN HCL 1000 MG PO TABS: 1000.0000 mg | ORAL_TABLET | Freq: Two times a day (BID) | ORAL | 1 refills | Status: DC

## 2018-08-19 MED ORDER — ATORVASTATIN CALCIUM 40 MG PO TABS: ORAL_TABLET | ORAL | 3 refills | Status: DC

## 2018-08-19 MED ORDER — GLIPIZIDE 10 MG PO TABS: ORAL_TABLET | ORAL | 1 refills | Status: DC

## 2018-08-19 NOTE — Patient Instructions (Signed)
Lo quiero ver en 3 meses Favor de venir a Software engineer de sangre en ayuna 2-3 dias antes.   Followup in 3 meses  Fasting labs 2-3 days prior appoitment

## 2018-08-19 NOTE — Progress Notes (Signed)
Virtual Visit via telephone Note  I connected with Alexander Parker 08/19/18 at 824 by telephone and verified that I am speaking with the correct person using two identifiers. Alexander Parker is currently located at home and patient is currently with her during visit. The provider, Rutherford Guys, MD is located in their office at time of visit.  I discussed the limitations, risks, security and privacy concerns of performing an evaluation and management service by telephone and the availability of in person appointments. I also discussed with the patient that there may be a patient responsible charge related to this service. The patient expressed understanding and agreed to proceed. Telephone visit today for  HPI ? Last OV in feb 2020 He was having lows then, stopped insulin, lows have resolved Checking cbgs twice a day, fasting and 2PP dinner This morning fasting was 130 Last night 188 7 day average: 149 Taking glipizide 32m twice a day and metformin 10052mBID Still taking lisinopril and atorvastatin Trying to eat less, avoiding carbohydrates Trying to ride his stationary bike for at least 30 minutes 2-3 times a week Does not check BP at home Overall feeling well  Lab Results  Component Value Date   HGBA1C 10.2 (A) 06/23/2018   HGBA1C 7.5 (A) 10/22/2017   HGBA1C 9.6 03/07/2017   Lab Results  Component Value Date   MICROALBUR <0.2 04/09/2016   LDLCALC 135 (H) 06/23/2018   CREATININE 0.74 (L) 06/23/2018    Fall Risk  07/21/2018 07/04/2018 06/23/2018 10/22/2017 03/07/2017  Falls in the past year? 0 0 0 No No  Number falls in past yr: 0 - - - -  Injury with Fall? 0 - - - -     [facility] 07/21/2018 07/04/2018 06/23/2018  Decreased Interest 0 0 0  Down, Depressed, Hopeless 0 0 0  PHQ - 2 Score 0 0 0  Altered sleeping - - -  Tired, decreased energy - - -  Change in appetite - - -  Feeling bad or failure about yourself  - - -  Trouble concentrating - - -  Moving  slowly or fidgety/restless - - -  Suicidal thoughts - - -  PHQ-9 Score - - -  Difficult doing work/chores - - -    No Known Allergies  Prior to Admission medications   Medication Sig Start Date End Date Taking? Authorizing Provider  atorvastatin (LIPITOR) 40 MG tablet TAKE ONE TABLET BY MOUTH ONCE DAILY FOR CHOLESTEROL 07/21/18   SaRutherford GuysMD  blood glucose meter kit and supplies KIT Per insurance preference. Use up to three times daily as directed. Dx E11.65, Z79.8. 06/23/18   SaRutherford GuysMD  CONTOUR NEXT TEST test strip USE TO TEST UP TO THREE TIMES DAILY AS DIRECTED 08/05/18   SaRutherford GuysMD  glipiZIDE (GLUCOTROL) 10 MG tablet TAKE 1 TABLET BY MOUTH TWICE DAILY BEFORE A MEAL 08/17/18   SaRutherford GuysMD  Insulin Pen Needle (PEN NEEDLES) 32G X 6 MM MISC 1 each by Does not apply route daily. 06/23/18   SaRutherford GuysMD  lisinopril (PRINIVIL,ZESTRIL) 20 MG tablet TAKE 1 TABLET BY MOUTH ONCE DAILY 07/28/18   SaRutherford GuysMD  metFORMIN (GLUCOPHAGE) 1000 MG tablet Take 1 tablet (1,000 mg total) by mouth 2 (two) times daily with a meal. 05/06/18   SaRutherford GuysMD  Microlet Lancets MISC USE TO TEST UP TO THREE TIMES A DAY AS DIRECTED 08/05/18   SaPamella Pert  Lilia Argue, MD    Past Medical History:  Diagnosis Date  . Diabetes mellitus without complication ([place])   . Hyperlipidemia   . Hypertension     Past Surgical History:  Procedure Laterality Date  . CIRCUMCISION      Social History   Tobacco Use  . Smoking status: Never Smoker  . Smokeless tobacco: Never Used  Substance Use Topics  . Alcohol use: No    Alcohol/week: 0.0 standard drinks    Family History  Problem Relation Age of Onset  . Diabetes Father   . Esophageal cancer Maternal Uncle   . Colon cancer Neg Hx   . Colon polyps Neg Hx   . Rectal cancer Neg Hx   . Stomach cancer Neg Hx     Review of Systems  Constitutional: Negative for chills and fever.  Respiratory: Negative for cough and  shortness of breath.   Cardiovascular: Negative for chest pain, palpitations and leg swelling.  Gastrointestinal: Negative for abdominal pain, nausea and vomiting.      Objective  Vitals as reported by the patient: none  BP Readings from Last 3 Encounters:  07/22/18 109/75  07/21/18 127/81  06/23/18 138/72    There were no vitals filed for this visit.  ASSESSMENT and PLAN  1. Type 2 diabetes mellitus without complication, without long-term current use of insulin (HCC) CBGs improved. Discussed cont checking glucose. Continue working on [employer]. Recheck labs in 3 months - lisinopril (PRINIVIL,ZESTRIL) 20 MG tablet; Take 1 tablet (20 mg total) by mouth daily. - atorvastatin (LIPITOR) 40 MG tablet; TAKE ONE TABLET BY MOUTH ONCE DAILY FOR CHOLESTEROL - Hemoglobin A1c; Future  2. Essential hypertension Historically well controlled. Continue with lisinopril. - lisinopril (PRINIVIL,ZESTRIL) 20 MG tablet; Take 1 tablet (20 mg total) by mouth daily. - Comprehensive metabolic panel; Future  3. Pure hypercholesterolemia Tolerating medication well. Refill today. Checking fasting prior to next visit.  - Lipid panel; Future  Other orders - metFORMIN (GLUCOPHAGE) 1000 MG tablet; Take 1 tablet (1,000 mg total) by mouth 2 (two) times daily with a meal. - glipiZIDE (GLUCOTROL) 10 MG tablet; TAKE 1 TABLET BY MOUTH TWICE DAILY BEFORE A MEAL  3 months  I discussed the assessment and treatment plan with the patient. The patient was provided an opportunity to ask questions and all were answered. The patient agreed with the plan and demonstrated an understanding of the instructions.   The patient was advised to call back or seek an in-person evaluation if the symptoms worsen or if the condition fails to improve as anticipated.  The above assessment and management plan was discussed with the patient. The patient verbalized understanding of and has agreed to the management plan. Patient is aware to call  the clinic if symptoms persist or worsen. Patient is aware when to return to the clinic for a follow-up visit. Patient educated on when it is appropriate to go to the emergency department.    I provided 12 minutes of non-face-to-face time during this encounter.  Rutherford Guys, MD Primary Care at [facility] [place], [place] [address] Ph.  [phone] Fax [phone]

## 2018-08-22 ENCOUNTER — Ambulatory Visit: Payer: BLUE CROSS/BLUE SHIELD | Admitting: Family Medicine

## 2018-09-27 ENCOUNTER — Other Ambulatory Visit: Payer: Self-pay | Admitting: Family Medicine

## 2018-11-12 ENCOUNTER — Ambulatory Visit: Payer: BC Managed Care – PPO | Admitting: Family Medicine

## 2018-11-12 ENCOUNTER — Other Ambulatory Visit: Payer: Self-pay

## 2018-11-12 ENCOUNTER — Encounter: Payer: Self-pay | Admitting: Family Medicine

## 2018-11-12 VITALS — BP 125/80 | HR 72 | Temp 98.4°F | Ht 63.0 in | Wt 149.0 lb

## 2018-11-12 DIAGNOSIS — R300 Dysuria: Secondary | ICD-10-CM | POA: Diagnosis not present

## 2018-11-12 DIAGNOSIS — I10 Essential (primary) hypertension: Secondary | ICD-10-CM

## 2018-11-12 DIAGNOSIS — E1169 Type 2 diabetes mellitus with other specified complication: Secondary | ICD-10-CM | POA: Diagnosis not present

## 2018-11-12 DIAGNOSIS — Z125 Encounter for screening for malignant neoplasm of prostate: Secondary | ICD-10-CM | POA: Diagnosis not present

## 2018-11-12 DIAGNOSIS — E7800 Pure hypercholesterolemia, unspecified: Secondary | ICD-10-CM

## 2018-11-12 DIAGNOSIS — Z794 Long term (current) use of insulin: Secondary | ICD-10-CM

## 2018-11-12 LAB — POCT URINALYSIS DIP (MANUAL ENTRY)
Bilirubin, UA: NEGATIVE
Blood, UA: NEGATIVE
Glucose, UA: NEGATIVE mg/dL
Ketones, POC UA: NEGATIVE mg/dL
Leukocytes, UA: NEGATIVE
Nitrite, UA: NEGATIVE
Protein Ur, POC: NEGATIVE mg/dL
Spec Grav, UA: <=1.005 — AB (ref 1.010–1.025)
Urobilinogen, UA: 0.2 E.U./dL
pH, UA: 5.5 (ref 5.0–8.0)

## 2018-11-12 LAB — POCT GLYCOSYLATED HEMOGLOBIN (HGB A1C): Hemoglobin A1C: 6.4 % — AB (ref 4.0–5.6)

## 2018-11-12 NOTE — Progress Notes (Signed)
6/17/20209:21 AM  Alexander Parker 1960-11-14, 58 y.o., male [id number]  Chief Complaint  Patient presents with  . Diabetes    blood has already been drawn   . Hypertension  . Medication Refill    on bp and dm meds    HPI:   Patient is a 58 y.o. male with past medical history significant for HLP, DM2, HTN who presents today for routine followup  Last OV March 2020 - no changes Eye exam was postponed due to covid, rescheduled to July  Brings in cbg log for past week Fasting avg: 119 Bedtime avg: 136  was having intermittent dysuria, no hematuria, fever or chills, urgency or frequency Has increased water intake and sx have resolved  Lab Results  Component Value Date   HGBA1C 10.2 (A) 06/23/2018   HGBA1C 7.5 (A) 10/22/2017   HGBA1C 9.6 03/07/2017   Lab Results  Component Value Date   MICROALBUR <0.2 04/09/2016   LDLCALC 135 (H) 06/23/2018   CREATININE 0.74 (L) 06/23/2018    Fall Risk  11/12/2018 07/21/2018 07/04/2018 06/23/2018 10/22/2017  Falls in the past year? 0 0 0 0 No  Number falls in past yr: 0 0 - - -  Injury with Fall? 0 0 - - -     Depression screen [facility] 2/9 11/12/2018 07/21/2018 07/04/2018  Decreased Interest 0 0 0  Down, Depressed, Hopeless 0 0 0  PHQ - 2 Score 0 0 0  Altered sleeping - - -  Tired, decreased energy - - -  Change in appetite - - -  Feeling bad or failure about yourself  - - -  Trouble concentrating - - -  Moving slowly or fidgety/restless - - -  Suicidal thoughts - - -  PHQ-9 Score - - -  Difficult doing work/chores - - -    No Known Allergies  Prior to Admission medications   Medication Sig Start Date End Date Taking? Authorizing Provider  atorvastatin (LIPITOR) 40 MG tablet TAKE ONE TABLET BY MOUTH ONCE DAILY FOR CHOLESTEROL 08/19/18  Yes Rutherford Guys, MD  blood glucose meter kit and supplies KIT Per insurance preference. Use up to three times daily as directed. Dx E11.65, Z79.8. 06/23/18  Yes Rutherford Guys, MD  CONTOUR NEXT  TEST test strip USE 1 STRIP TO CHECK GLUCOSE TO  THREE TIMES DAILY AS DIRECTED 09/28/18  Yes Rutherford Guys, MD  glipiZIDE (GLUCOTROL) 10 MG tablet TAKE 1 TABLET BY MOUTH TWICE DAILY BEFORE A MEAL 08/19/18  Yes Rutherford Guys, MD  Insulin Pen Needle (PEN NEEDLES) 32G X 6 MM MISC 1 each by Does not apply route daily. 06/23/18  Yes Rutherford Guys, MD  lisinopril (PRINIVIL,ZESTRIL) 20 MG tablet Take 1 tablet (20 mg total) by mouth daily. 08/19/18  Yes Rutherford Guys, MD  metFORMIN (GLUCOPHAGE) 1000 MG tablet Take 1 tablet (1,000 mg total) by mouth 2 (two) times daily with a meal. 08/19/18  Yes Rutherford Guys, MD  Microlet Lancets MISC USE 1  TO CHECK GLUCOSE UP TO THREE TIMES DAILY AS DIRECTED 09/28/18  Yes Rutherford Guys, MD    Past Medical History:  Diagnosis Date  . Diabetes mellitus without complication ([place])   . Hyperlipidemia   . Hypertension     Past Surgical History:  Procedure Laterality Date  . CIRCUMCISION      Social History   Tobacco Use  . Smoking status: Never Smoker  . Smokeless tobacco: Never Used  Substance Use Topics  .  Alcohol use: No    Alcohol/week: 0.0 standard drinks    Family History  Problem Relation Age of Onset  . Diabetes Father   . Esophageal cancer Maternal Uncle   . Colon cancer Neg Hx   . Colon polyps Neg Hx   . Rectal cancer Neg Hx   . Stomach cancer Neg Hx     Review of Systems  Constitutional: Negative for chills and fever.  Respiratory: Negative for cough and shortness of breath.   Cardiovascular: Negative for chest pain, palpitations and leg swelling.  Gastrointestinal: Negative for abdominal pain, nausea and vomiting.   Per hpi  OBJECTIVE:  Today's Vitals   11/12/18 0858  BP: 125/80  Pulse: 72  Temp: 98.4 F (36.9 C)  TempSrc: Oral  SpO2: 97%  Weight: 149 lb (67.6 kg)  Height: '5\' 3"'  (1.6 m)   Body mass index is 26.39 kg/m.  Wt Readings from Last 3 Encounters:  11/12/18 149 lb (67.6 kg)  07/22/18 152 lb (68.9 kg)   07/21/18 151 lb 3.2 oz (68.6 kg)   Physical Exam Vitals signs and nursing note reviewed.  Constitutional:      Appearance: He is well-developed.  HENT:     Head: Normocephalic and atraumatic.  Eyes:     Conjunctiva/sclera: Conjunctivae normal.     Pupils: Pupils are equal, round, and reactive to light.  Neck:     Musculoskeletal: Neck supple.  Cardiovascular:     Rate and Rhythm: Normal rate and regular rhythm.     Heart sounds: No murmur. No friction rub. No gallop.   Pulmonary:     Effort: Pulmonary effort is normal.     Breath sounds: Normal breath sounds. No wheezing or rales.  Skin:    General: Skin is warm and dry.  Neurological:     Mental Status: He is alert and oriented to person, place, and time.      Diabetic Foot Exam - Simple   Simple Foot Form Visual Inspection No deformities, no ulcerations, no other skin breakdown bilaterally: Yes Sensation Testing Intact to touch and monofilament testing bilaterally: Yes Pulse Check Comments Felt all pricks with the filament, no broken skin     Results for orders placed or performed in visit on 11/12/18 (from the past 24 hour(s))  POCT glycosylated hemoglobin (Hb A1C)     Status: Abnormal   Collection Time: 11/12/18  9:40 AM  Result Value Ref Range   Hemoglobin A1C 6.4 (A) 4.0 - 5.6 %   HbA1c POC (<> result, manual entry)     HbA1c, POC (prediabetic range)     HbA1c, POC (controlled diabetic range)    POCT urinalysis dipstick     Status: Abnormal   Collection Time: 11/12/18  9:44 AM  Result Value Ref Range   Color, UA yellow yellow   Clarity, UA clear clear   Glucose, UA negative negative mg/dL   Bilirubin, UA negative negative   Ketones, POC UA negative negative mg/dL   Spec Grav, UA <=1.005 (A) 1.010 - 1.025   Blood, UA negative negative   pH, UA 5.5 5.0 - 8.0   Protein Ur, POC negative negative mg/dL   Urobilinogen, UA 0.2 0.2 or 1.0 E.U./dL   Nitrite, UA Negative Negative   Leukocytes, UA Negative  Negative    No results found.   ASSESSMENT and PLAN  1. Type 2 diabetes mellitus with other specified complication, with long-term current use of insulin (HCC) Controlled. Continue current regime.  - HM  DIABETES FOOT EXAM - POCT glycosylated hemoglobin (Hb A1C) - Comprehensive metabolic panel  2. Screening for prostate cancer - PSA  3. Essential hypertension Controlled. Continue current regime.   - Comprehensive metabolic panel  4. Pure hypercholesterolemia Checking labs today, medications will be adjusted as needed.  - Lipid panel  5. Dysuria Normal ua. Cont pushing fluids. - PSA - POCT urinalysis dipstick  Return in about 6 months (around 05/14/2019).    Rutherford Guys, MD Primary Care at [place], [place] [address] Ph.  [phone] Fax [phone]

## 2018-11-12 NOTE — Patient Instructions (Signed)
° ° ° °  If you have lab work done today you will be contacted with your lab results within the next 2 weeks.  If you have not heard from us then please contact us. The fastest way to get your results is to register for My Chart. ° ° °IF you received an x-ray today, you will receive an invoice from [facility]. Please contact [facility] at [phone] with questions or concerns regarding your invoice.  ° °IF you received labwork today, you will receive an invoice from [facility]. Please contact [facility] at [phone] with questions or concerns regarding your invoice.  ° °Our billing staff will not be able to assist you with questions regarding bills from these companies. ° °You will be contacted with the lab results as soon as they are available. The fastest way to get your results is to activate your My Chart account. Instructions are located on the last page of this paperwork. If you have not heard from us regarding the results in 2 weeks, please contact this office. °  ° ° ° °

## 2018-11-13 LAB — COMPREHENSIVE METABOLIC PANEL
ALT: 54 IU/L — ABNORMAL HIGH (ref 0–44)
AST: 31 IU/L (ref 0–40)
Albumin/Globulin Ratio: 1.9 (ref 1.2–2.2)
Albumin: 4.5 g/dL (ref 3.8–4.9)
Alkaline Phosphatase: 70 IU/L (ref 39–117)
BUN/Creatinine Ratio: 16 (ref 9–20)
BUN: 11 mg/dL (ref 6–24)
Bilirubin Total: 0.5 mg/dL (ref 0.0–1.2)
CO2: 25 mmol/L (ref 20–29)
Calcium: 9.6 mg/dL (ref 8.7–10.2)
Chloride: 99 mmol/L (ref 96–106)
Creatinine, Ser: 0.67 mg/dL — ABNORMAL LOW (ref 0.76–1.27)
GFR calc Af Amer: 124 mL/min/{1.73_m2} (ref >59)
GFR calc non Af Amer: 107 mL/min/{1.73_m2} (ref >59)
Globulin, Total: 2.4 g/dL (ref 1.5–4.5)
Glucose: 126 mg/dL — ABNORMAL HIGH (ref 65–99)
Potassium: 4.6 mmol/L (ref 3.5–5.2)
Sodium: 139 mmol/L (ref 134–144)
Total Protein: 6.9 g/dL (ref 6.0–8.5)

## 2018-11-13 LAB — LIPID PANEL
Chol/HDL Ratio: 2.9 ratio (ref 0.0–5.0)
Cholesterol, Total: 100 mg/dL (ref 100–199)
HDL: 34 mg/dL — ABNORMAL LOW (ref >39)
LDL Calculated: 55 mg/dL (ref 0–99)
Triglycerides: 56 mg/dL (ref 0–149)
VLDL Cholesterol Cal: 11 mg/dL (ref 5–40)

## 2018-11-13 LAB — PSA: Prostate Specific Ag, Serum: 1.8 ng/mL (ref 0.0–4.0)

## 2018-11-16 ENCOUNTER — Other Ambulatory Visit: Payer: Self-pay | Admitting: Family Medicine

## 2018-12-11 DIAGNOSIS — H43812 Vitreous degeneration, left eye: Secondary | ICD-10-CM | POA: Diagnosis not present

## 2018-12-11 DIAGNOSIS — E119 Type 2 diabetes mellitus without complications: Secondary | ICD-10-CM | POA: Diagnosis not present

## 2018-12-11 DIAGNOSIS — H11041 Peripheral pterygium, stationary, right eye: Secondary | ICD-10-CM | POA: Diagnosis not present

## 2018-12-11 DIAGNOSIS — H2513 Age-related nuclear cataract, bilateral: Secondary | ICD-10-CM | POA: Diagnosis not present

## 2018-12-11 LAB — HM DIABETES EYE EXAM

## 2019-01-09 ENCOUNTER — Other Ambulatory Visit: Payer: Self-pay | Admitting: Family Medicine

## 2019-01-09 DIAGNOSIS — I10 Essential (primary) hypertension: Secondary | ICD-10-CM

## 2019-01-09 DIAGNOSIS — E119 Type 2 diabetes mellitus without complications: Secondary | ICD-10-CM

## 2019-01-10 NOTE — Telephone Encounter (Signed)
Please review refill request 

## 2019-01-14 ENCOUNTER — Other Ambulatory Visit: Payer: Self-pay | Admitting: Family Medicine

## 2019-01-14 DIAGNOSIS — E119 Type 2 diabetes mellitus without complications: Secondary | ICD-10-CM | POA: Diagnosis not present

## 2019-01-14 DIAGNOSIS — H11041 Peripheral pterygium, stationary, right eye: Secondary | ICD-10-CM | POA: Diagnosis not present

## 2019-01-14 DIAGNOSIS — H43812 Vitreous degeneration, left eye: Secondary | ICD-10-CM | POA: Diagnosis not present

## 2019-01-14 DIAGNOSIS — H2513 Age-related nuclear cataract, bilateral: Secondary | ICD-10-CM | POA: Diagnosis not present

## 2019-01-14 NOTE — Telephone Encounter (Signed)
lisinopril (ZESTRIL) 20 MG tablet  Pt stated he went to pharmacy to pick up medication and they said he need a new prescription.

## 2019-01-14 NOTE — Telephone Encounter (Signed)
Pharmacy reports they need a new prescrition.

## 2019-02-08 DIAGNOSIS — Z23 Encounter for immunization: Secondary | ICD-10-CM | POA: Diagnosis not present

## 2019-03-13 ENCOUNTER — Other Ambulatory Visit: Payer: Self-pay | Admitting: Family Medicine

## 2019-03-16 DIAGNOSIS — H43812 Vitreous degeneration, left eye: Secondary | ICD-10-CM | POA: Diagnosis not present

## 2019-03-16 DIAGNOSIS — H2513 Age-related nuclear cataract, bilateral: Secondary | ICD-10-CM | POA: Diagnosis not present

## 2019-03-29 ENCOUNTER — Other Ambulatory Visit: Payer: Self-pay | Admitting: Family Medicine

## 2019-03-29 DIAGNOSIS — I10 Essential (primary) hypertension: Secondary | ICD-10-CM

## 2019-03-29 DIAGNOSIS — E119 Type 2 diabetes mellitus without complications: Secondary | ICD-10-CM

## 2019-04-02 ENCOUNTER — Other Ambulatory Visit: Payer: Self-pay | Admitting: Family Medicine

## 2019-04-02 NOTE — Telephone Encounter (Signed)
Please review for refill protocol for microlet lancets .

## 2019-05-11 ENCOUNTER — Other Ambulatory Visit: Payer: Self-pay

## 2019-05-11 ENCOUNTER — Encounter: Payer: Self-pay | Admitting: Family Medicine

## 2019-05-11 ENCOUNTER — Ambulatory Visit: Payer: BC Managed Care – PPO | Admitting: Family Medicine

## 2019-05-11 VITALS — BP 120/77 | HR 79 | Temp 98.0°F | Ht 63.0 in | Wt 153.6 lb

## 2019-05-11 DIAGNOSIS — E7800 Pure hypercholesterolemia, unspecified: Secondary | ICD-10-CM

## 2019-05-11 DIAGNOSIS — I10 Essential (primary) hypertension: Secondary | ICD-10-CM | POA: Diagnosis not present

## 2019-05-11 DIAGNOSIS — E119 Type 2 diabetes mellitus without complications: Secondary | ICD-10-CM

## 2019-05-11 MED ORDER — ATORVASTATIN CALCIUM 40 MG PO TABS: ORAL_TABLET | ORAL | 3 refills | Status: DC

## 2019-05-11 MED ORDER — METFORMIN HCL 1000 MG PO TABS: 1000.0000 mg | ORAL_TABLET | Freq: Two times a day (BID) | ORAL | 1 refills | Status: DC

## 2019-05-11 MED ORDER — GLIPIZIDE 10 MG PO TABS: ORAL_TABLET | ORAL | 1 refills | Status: DC
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
## 2019-05-11 NOTE — Patient Instructions (Signed)
Hipoglucemia Hypoglycemia La hipoglucemia se produce cuando el nivel de azcar (glucosa) en la sangre es demasiado bajo. Los signos de un nivel bajo de Location manager en la sangre pueden incluir los siguientes:  Sentir: ? Hambre. ? Preocupacin o nervios (ansiedad). ? Sudoracin y [employer]. ? Confusin. ? Mareos. ? Somnolencia. ? Ganas de vomitar (nuseas).  Tener: ? Latidos cardacos acelerados. ? Dolor de [place]. ? Cambios en la visin. ? Hormigueo y falta de sensibilidad (entumecimiento) alrededor de la boca, labios o lengua. ? Movimientos espasmdicos que no puede controlar (convulsiones).  Dificultades para hacer lo siguiente: ? Moverse (coordinacin). ? Dormir. ? Desmayos. ? Molestarse con facilidad (irritabilidad). Las personas que tienen diabetes y las que no tienen la enfermedad pueden tener un nivel de Location manager en la sangre bajo. El nivel bajo de azcar en la sangre puede ocurrir rpidamente y ser Engineer, maintenance (IT). Tratamiento del nivel bajo de azcar en la sangre [place], el tratamiento de un nivel de azcar en la sangre bajo consiste en ingerir de inmediato un alimento o una bebida que contenga azcar, por ejemplo:  De 4 a 6onzas (de 120 a 129m) de jugo de frutas.  De 4 a 6onzas (de 120 a 1510m de refresco comn (no diettico).  4 onzas (12016mde lecUSG CorporationVarios caramelos duros.  1 cucharada (51m71me miel o azcar. Tratamiento del nivel bajo de azcar en la sangre si tiene diabetes Si puede pensar con claridad y tragar de manera segura, siga la regla 15/15, que consiste en lo siguiente:  Consuma 15gramos de un hidrato de carbono de accin rpida (carbohidrato). Hable con su mdico acerca de cunto debera consumir.  Lleve siempre consigo una fuente de hidratos de carbono de accin rpida, por ejemplo: ? Comprimidos de azcar (pastillas de glucosa). Tome 3 o 4 comprimidos. ? De 6 a 8unidades de caramelos duros. ? De 4 a 6onzas (de 120 a  150ml28m jugo de frutas. ? De 4 a 6onzas (de 120 a 150ml)26mrefresco comn (no diettico). ? 1 cucharada (51ml) 33miel o azcar.  Contrlese el nivel de azcar en la sangre 51minut5mespus de ingerir el hidrato de carbono.  Si el nivel de azcar en la sangre todava es igual o menor que 70mg/dl 62mmmol/l),40mgiera nuevamente 15gramos de un hidrato de carbono.  Si el nivel de azcar en la sangre no supera los 70mg/dl (371mol/l) de26ms de 3intentos, solicite ayuda de inmediato.  Ingiera una comida o una colacin en el transcurso de 1hora despus de que el nivel de azcar en la sangre se haya normalizado.  Tratamiento del nivel muy bajo de azcar en la sangre Si el nivel de azcar en la sangre es igual o menor que 54mg/dl (3mm53m), sig42mica que est muy bajo (hipoglucemia grave). Esto tambin puede causar:  Desmayos.  Movimientos espasmdicos que no puede controlar (convulsiones).  Prdida de la conciencia (coma). Esto es una emergenciaEngineer, maintenance (IT)ver si los sntomas desaparecen. Solicite atencin mdica de inmediato. Comunquese con el servicio de emergencias de su localidad (911 en los [place]). No conduzca por sus propios medios hasta el hospiPrincipal Financialde azcar en la sangre es muy bajo y no puede ingerir ningn alimento ni bebida, tal vez deba aplicarse una inyeccin de glucagn. Un familiar o un amigo deben aprender a controlarle el azcar en la sangre y a aplicarle una inyeccin de glucagn. Pregntele al mdico si debe tener un kit de inyecciones de glucagn en su casa. Siga  estas indicaciones en su casa: Indicaciones generales  [employer] de venta libre y los recetados solamente como se lo haya indicado el mdico.  Sepa cul es su nivel de azcar en la sangre como se lo haya indicado el mdico.  Limite el consumo de alcohol a no ms de 47mdida por da si es mujer y no est eWeaubleau y 253midas por da si es hombre. Una medida  equivale a 12onzas de cerveza (35579m 5onzas de vino (148m1m 1onzas de bebidas alcohlicas de alta graduacin (44ml43mConcurra a todas las visitas de control como se lo haya indicado el mdico. Esto es importante. Si usted tiene diabetes:   Siga el plan de atencin de la diabetes como se lo haya indicado el mdico. AsegrChief Strategy Officeracer lo siguiente: ? Conozca los signos de un nivel bajo de azcarDispensing opticianome Delphi las indicaciones. ? Siga su plan de ejercicio y de alimentacin. ? Coma a horario. No omita comidas. ? Controle su nivel de azcar en la sangre con la frecuencia que le haya indicado el mdico. Contrleselo siempre antes y despus de hacer ejercicio. ? Siga su plan para los das de enfermedad cuando no pueda comer ni beber normalmente. Elabore este plan de antemano con el mdico.  Comparta su plan de atencin de la diabetes con: ? Sus compaeros de trabajo o de la escuela. ? Las pAnadarko Petroleum Corporationlas que conviFountain Cityase anlisis de orina para detecProduct managerencia de cetonas: ? Cuando est enfermo. ? Como se lo haya indicado el mdico.  Lleve consigo una tarjeta, o use un brazalete o una medalla que indique que tiene diabetes. Comunquese con un mdico si:  Tiene problemas para manteAdvertising account executivezcar en la sangre dentro del rango indicado.  Tiene un nivel de azcar en la sangre bajo con frecuencia. Solicite ayuda inmediatamente si:  Contina teniendo sntomas despus de haber comido o ingerido algo con azcar.  Su nivel de azcar en la sangre es igual o inferior a 54mg/63m3mmol/78m.  Tiene movimientos espasmdicos que no puede cIT consultantesmaya. Estos sntomas pueden indicarSales executivepere a ver si los sntomas desaparecen. Solicite atencin mdica de inmediato. Comunquese con el servicio de emergencias de su localidad (911 en los [place]). No conduzca por sus propios medios hasta eGoldman Sachsal. Resumen  La  hipoglucemia sucede cuando el nivel de azcar (glucosa) en la sangre es demasiado bajo.  Las personas que tienen diabetes y las que no tienen la enfermedad pueden tener un nivel de azcar eLocation managersangre bajo. El nivel bajo de azcar en la sangre puede ocurrir rpidamente y ser una emeEngineer, maintenance (IT)rese de conocerRyland Group de un nivel bajo de azcar en la sangre y saber cmo tratarlo.  Lleve siempre consigo una fuente de azcar (hidratos de carbono de accin rpida) para tratar un nivel bajo de azcar en la sangre. Esta informacin no tiene como fiMarine scientistsejo del mdico. Asegrese de hacerle al mdico cualquier pregunta que tenga. Document Released: 06/16/2010 Document Revised: 12/25/2017 Document Reviewed: 12/25/2017 Elsevier Patient Education  2020 Elsevie[employer]ynolds [employer]

## 2019-05-12 LAB — HEMOGLOBIN A1C
Est. average glucose Bld gHb Est-mCnc: 160 mg/dL
Hgb A1c MFr Bld: 7.2 % — ABNORMAL HIGH (ref 4.8–5.6)

## 2019-05-12 LAB — CMP14+EGFR
ALT: 41 IU/L (ref 0–44)
AST: 21 IU/L (ref 0–40)
Albumin/Globulin Ratio: 1.7 (ref 1.2–2.2)
Albumin: 4.3 g/dL (ref 3.8–4.9)
Alkaline Phosphatase: 82 IU/L (ref 39–117)
BUN/Creatinine Ratio: 21 — ABNORMAL HIGH (ref 9–20)
BUN: 14 mg/dL (ref 6–24)
Bilirubin Total: 0.4 mg/dL (ref 0.0–1.2)
CO2: 23 mmol/L (ref 20–29)
Calcium: 9.5 mg/dL (ref 8.7–10.2)
Chloride: 99 mmol/L (ref 96–106)
Creatinine, Ser: 0.67 mg/dL — ABNORMAL LOW (ref 0.76–1.27)
GFR calc Af Amer: 123 mL/min/{1.73_m2} (ref >59)
GFR calc non Af Amer: 107 mL/min/{1.73_m2} (ref >59)
Globulin, Total: 2.5 g/dL (ref 1.5–4.5)
Glucose: 134 mg/dL — ABNORMAL HIGH (ref 65–99)
Potassium: 4.4 mmol/L (ref 3.5–5.2)
Sodium: 136 mmol/L (ref 134–144)
Total Protein: 6.8 g/dL (ref 6.0–8.5)

## 2019-05-12 LAB — LIPID PANEL
Chol/HDL Ratio: 4 ratio (ref 0.0–5.0)
Cholesterol, Total: 147 mg/dL (ref 100–199)
HDL: 37 mg/dL — ABNORMAL LOW (ref >39)
LDL Chol Calc (NIH): 94 mg/dL (ref 0–99)
Triglycerides: 84 mg/dL (ref 0–149)
VLDL Cholesterol Cal: 16 mg/dL (ref 5–40)

## 2019-05-12 LAB — MICROALBUMIN / CREATININE URINE RATIO
Creatinine, Urine: 162.8 mg/dL
Microalb/Creat Ratio: 4 mg/g creat (ref 0–29)
Microalbumin, Urine: 5.7 ug/mL

## 2019-05-12 LAB — TSH: TSH: 1.39 u[IU]/mL (ref 0.450–4.500)

## 2019-05-14 ENCOUNTER — Encounter: Payer: Self-pay | Admitting: Radiology

## 2019-05-23 ENCOUNTER — Other Ambulatory Visit: Payer: Self-pay | Admitting: Family Medicine

## 2019-05-23 DIAGNOSIS — E119 Type 2 diabetes mellitus without complications: Secondary | ICD-10-CM

## 2019-05-23 DIAGNOSIS — I10 Essential (primary) hypertension: Secondary | ICD-10-CM

## 2019-05-23 NOTE — Telephone Encounter (Signed)
Forwarding medication refill request to the clinical pool for review. 

## 2019-06-02 ENCOUNTER — Other Ambulatory Visit: Payer: Self-pay | Admitting: Family Medicine

## 2019-06-16 ENCOUNTER — Ambulatory Visit: Payer: BLUE CROSS/BLUE SHIELD | Attending: Internal Medicine

## 2019-06-16 DIAGNOSIS — Z20822 Contact with and (suspected) exposure to covid-19: Secondary | ICD-10-CM

## 2019-06-17 LAB — NOVEL CORONAVIRUS, NAA: SARS-CoV-2, NAA: NOT DETECTED

## 2019-08-20 ENCOUNTER — Other Ambulatory Visit: Payer: Self-pay | Admitting: Family Medicine

## 2019-08-21 ENCOUNTER — Other Ambulatory Visit: Payer: Self-pay | Admitting: Family Medicine

## 2019-09-06 DIAGNOSIS — H9221 Otorrhagia, right ear: Secondary | ICD-10-CM | POA: Diagnosis not present

## 2019-09-07 ENCOUNTER — Other Ambulatory Visit: Payer: Self-pay

## 2019-09-07 ENCOUNTER — Encounter (HOSPITAL_COMMUNITY): Payer: Self-pay | Admitting: *Deleted

## 2019-09-07 ENCOUNTER — Emergency Department (HOSPITAL_COMMUNITY)
Admission: EM | Admit: 2019-09-07 | Discharge: 2019-09-07 | Disposition: A | Discharge status: Home / Self Care | Payer: BC Managed Care – PPO | Attending: Emergency Medicine | Admitting: Emergency Medicine

## 2019-09-07 DIAGNOSIS — Z7984 Long term (current) use of oral hypoglycemic drugs: Secondary | ICD-10-CM | POA: Diagnosis not present

## 2019-09-07 DIAGNOSIS — E119 Type 2 diabetes mellitus without complications: Secondary | ICD-10-CM | POA: Insufficient documentation

## 2019-09-07 DIAGNOSIS — H9221 Otorrhagia, right ear: Secondary | ICD-10-CM | POA: Diagnosis not present

## 2019-09-07 DIAGNOSIS — Z79899 Other long term (current) drug therapy: Secondary | ICD-10-CM | POA: Diagnosis not present

## 2019-09-07 DIAGNOSIS — I10 Essential (primary) hypertension: Secondary | ICD-10-CM | POA: Insufficient documentation

## 2019-09-07 NOTE — ED Provider Notes (Signed)
[facility] EMERGENCY DEPARTMENT Provider Note   CSN: [id number] Arrival date & time: 09/07/19  3291   History Chief Complaint  Patient presents with  . Otalgia   Alexander Parker is a 59 y.o. male with history significant for hypertension, hyperlipidemia, diabetes who presents for evaluation of bleeding from his right ear.  Was seen by urgent care yesterday.  Was given Ciprodex drops.  Apparently they had consulted with ENT, Dr. Janace Hoard at that time.  States he wanted patient to be seen by ENT within the next 48 hours.  Patient states he called ENT office today and states he cannot get him in for "weeks."  Patient states he has had decreased bleeding from his ear.  He denies any pain.  No recent injury or trauma. Denies q tip use. No discharge from ear or recent swimming activities.  Been putting the Ciprodex drops in for a total of 2 doses since they were prescribed.  Denies facial pain, lightheadedness, dizziness, headache, neck pain, neck stiffness, drooling, dysphagia, trismus.  Denies additional aggravating or alleviating factors.  Denies any anticoagulation use.  Seen by UC was supposed to be seen by Dr. Janace Hoard with ENT however patients states unable to get appointment.  History obtained from patient, past medical records, paperwork from urgent care.  Medical Spanish interpreter was used.  HPI     Past Medical History:  Diagnosis Date  . Diabetes mellitus without complication ([place])   . Hyperlipidemia   . Hypertension     Patient Active Problem List   Diagnosis Date Noted  . Uncontrolled type 2 diabetes mellitus with hyperglycemia ([place]) 07/04/2018  . Pure hypercholesterolemia 06/23/2018  . HTN (hypertension) 12/23/2011  . Diabetes mellitus ([place]) 07/29/2011    Past Surgical History:  Procedure Laterality Date  . CIRCUMCISION         Family History  Problem Relation Age of Onset  . Diabetes Father   . Esophageal cancer Maternal Uncle   . Colon cancer  Neg Hx   . Colon polyps Neg Hx   . Rectal cancer Neg Hx   . Stomach cancer Neg Hx     Social History   Tobacco Use  . Smoking status: Never Smoker  . Smokeless tobacco: Never Used  Substance Use Topics  . Alcohol use: No    Alcohol/week: 0.0 standard drinks  . Drug use: No    Home Medications Prior to Admission medications   Medication Sig Start Date End Date Taking? Authorizing Provider  atorvastatin (LIPITOR) 40 MG tablet TAKE ONE TABLET BY MOUTH ONCE DAILY FOR CHOLESTEROL 05/11/19   Rutherford Guys, MD  blood glucose meter kit and supplies KIT Per insurance preference. Use up to three times daily as directed. Dx E11.65, Z79.8. 06/23/18   Rutherford Guys, MD  CONTOUR NEXT TEST test strip USE 1 STRIP TO CHECK GLUCOSE THREE TIMES DAILY AS DIRECTED 08/21/19   Rutherford Guys, MD  glipiZIDE (GLUCOTROL) 10 MG tablet TAKE 1 TABLET BY MOUTH TWICE DAILY BEFORE A MEAL 05/11/19   Rutherford Guys, MD  Insulin Pen Needle (PEN NEEDLES) 32G X 6 MM MISC 1 each by Does not apply route daily. 06/23/18   Rutherford Guys, MD  lisinopril (ZESTRIL) 20 MG tablet Take 1 tablet by mouth once daily 05/25/19   Rutherford Guys, MD  metFORMIN (GLUCOPHAGE) 1000 MG tablet Take 1 tablet (1,000 mg total) by mouth 2 (two) times daily with a meal. 05/11/19   Rutherford Guys, MD  Microlet Lancets MISC USE 1 TO CHECK GLUCOSE THREE TIMES DAILY AS DIRECTED. 08/20/19   Rutherford Guys, MD    Allergies    Patient has no known allergies.  Review of Systems   Review of Systems  Constitutional: Negative.   HENT: Positive for ear discharge. Negative for congestion, drooling, ear pain, facial swelling, mouth sores, postnasal drip, rhinorrhea, sinus pressure, sinus pain, sore throat and voice change.   Respiratory: Negative.   Cardiovascular: Negative.   Gastrointestinal: Negative.   Genitourinary: Negative.   Musculoskeletal: Negative.   Skin: Negative.   Neurological: Negative.   All other systems reviewed and  are negative.   Physical Exam Updated Vital Signs BP 120/86   Pulse 83   Temp 98.3 F (36.8 C) (Oral)   Resp 17   Ht '5\' 3"'$  (1.6 m)   Wt 70.3 kg   SpO2 98%   BMI 27.46 kg/m   Physical Exam Vitals and nursing note reviewed.  Constitutional:      General: He is not in acute distress.    Appearance: He is well-developed. He is not ill-appearing, toxic-appearing or diaphoretic.  HENT:     Head: Normocephalic and atraumatic.     Jaw: There is normal jaw occlusion.     Comments: EOMs intact.  PERRLA.  No nystagmus    Right Ear: No decreased hearing noted. No laceration, drainage, swelling or tenderness. No middle ear effusion. There is no impacted cerumen. No foreign body. No mastoid tenderness. No PE tube. No hemotympanum.     Left Ear: Hearing, tympanic membrane, ear canal and external ear normal. No decreased hearing noted. No mastoid tenderness. No hemotympanum.     Ears:     Comments: Red crusted blood in the right ear canal.  No active bleeding.  No evidence of lacerations or skin breakdown.  Appears to have a small perforation at right TM.    Nose: Nose normal.     Mouth/Throat:     Mouth: Mucous membranes are moist.     Pharynx: Oropharynx is clear.  Eyes:     Pupils: Pupils are equal, round, and reactive to light.  Neck:     Trachea: Trachea and phonation normal.     Comments: No neck stiffness or neck rigidity. Cardiovascular:     Rate and Rhythm: Normal rate and regular rhythm.     Pulses: Normal pulses.     Heart sounds: Normal heart sounds.  Pulmonary:     Effort: Pulmonary effort is normal. No respiratory distress.     Breath sounds: Normal breath sounds and air entry.  Abdominal:     General: There is no distension.     Palpations: Abdomen is soft.  Musculoskeletal:        General: Normal range of motion.     Cervical back: Full passive range of motion without pain, normal range of motion and neck supple.  Lymphadenopathy:     Cervical: No cervical  adenopathy.  Skin:    General: Skin is warm and dry.  Neurological:     General: No focal deficit present.     Mental Status: He is alert.     Cranial Nerves: Cranial nerves are intact.     Sensory: Sensation is intact.     Motor: Motor function is intact. No weakness.     Coordination: Coordination is intact.     Gait: Gait is intact.     Comments: Cranial nerves II through XII grossly intact.  Gait without  ataxia.  Negative finger-nose, Romberg     ED Results / Procedures / Treatments   Labs (all labs ordered are listed, but only abnormal results are displayed) Labs Reviewed - No data to display  EKG None  Radiology No results found.  Procedures Procedures (including critical care time)  Medications Ordered in ED Medications - No data to display  ED Course  I have reviewed the triage vital signs and the nursing notes.  Pertinent labs & imaging results that were available during my care of the patient were reviewed by me and considered in my medical decision making (see chart for details).  59 year old male appears otherwise well presents for evaluation of bleeding to his right ear canal.  Apparently was seen by urgent care, fast med yesterday.  Prescribed Ciprodex drops patient has used 2 doses of.  Rifaximin discharge paperwork they had contacted Dr. Janace Hoard, ENT physician.  He had recommended follow-up in office within 48 hours per urgent care note.  Patient denies any recent head trauma or injury or anticoagulation.  He denies any pain.  He is neurovascularly intact without any focal neuro deficits.  He has no neck pain or neck stiffness.  No recent swimming activity.  Patient does have cotton ball to his right ear.  Once removed there was some dried crusted blood in his right ear canal however no active bleeding.  Appears to have perforation to his right TM.  No abnormality to right TM.  He has no tenderness over his mastoid.  He has no meningismus.  He is tolerating p.o.  intake.  Currently patient states he was supposed to be evaluated today.  When he called to make an appointment with ENT and they could not get him in he decided to come to the emergency department.  I did personally call [facility] ear nose and throat physicians with [facility].  I talk with her secretary and I was able to make patient an appointment within 48 hours on Wednesday morning.  Discussed return precautions with patient.  I do not feel he needs any imaging at this time.  His vital signs are overall reassuring and appears well.  The patient has been appropriately medically screened and/or stabilized in the ED. I have low suspicion for any other emergent medical condition which would require further screening, evaluation or treatment in the ED or require inpatient management.  Patient is hemodynamically stable and in no acute distress.  Patient able to ambulate in department prior to ED.  Evaluation does not show acute pathology that would require ongoing or additional emergent interventions while in the emergency department or further inpatient treatment.  I have discussed the diagnosis with the patient and answered all questions.  Pain is been managed while in the emergency department and patient has no further complaints prior to discharge.  Patient is comfortable with plan discussed in room and is stable for discharge at this time.  I have discussed strict return precautions for returning to the emergency department.  Patient was encouraged to follow-up with PCP/specialist refer to at discharge.    MDM Rules/Calculators/A&P                       Final Clinical Impression(s) / ED Diagnoses Final diagnoses:  Blood in right ear canal    Rx / DC Orders ED Discharge Orders    None       Simran Honor A, PA-C 09/07/19 1241    Carmin Muskrat, MD 09/07/19  1511  

## 2019-09-07 NOTE — Discharge Instructions (Signed)
You have an appointment with the ear specialist on Wednesday 4/14 at 920  Their address is listed on your discharge paperwork  Continue using the drops and placing a cotton ball to the ear.

## 2019-09-07 NOTE — ED Triage Notes (Signed)
Pt reports right ear pain and bleeding since Saturday.

## 2019-09-09 DIAGNOSIS — H60311 Diffuse otitis externa, right ear: Secondary | ICD-10-CM | POA: Diagnosis not present

## 2019-09-18 ENCOUNTER — Other Ambulatory Visit: Payer: Self-pay

## 2019-09-18 ENCOUNTER — Ambulatory Visit: Payer: BC Managed Care – PPO | Admitting: Family Medicine

## 2019-09-18 ENCOUNTER — Encounter: Payer: Self-pay | Admitting: Family Medicine

## 2019-09-18 VITALS — BP 138/86 | HR 89 | Temp 98.3°F | Ht 63.0 in | Wt 151.0 lb

## 2019-09-18 DIAGNOSIS — E119 Type 2 diabetes mellitus without complications: Secondary | ICD-10-CM | POA: Diagnosis not present

## 2019-09-18 DIAGNOSIS — E7800 Pure hypercholesterolemia, unspecified: Secondary | ICD-10-CM

## 2019-09-18 DIAGNOSIS — R319 Hematuria, unspecified: Secondary | ICD-10-CM

## 2019-09-18 DIAGNOSIS — I10 Essential (primary) hypertension: Secondary | ICD-10-CM

## 2019-09-18 LAB — POCT URINALYSIS DIP (MANUAL ENTRY)
Bilirubin, UA: NEGATIVE
Glucose, UA: NEGATIVE mg/dL
Ketones, POC UA: NEGATIVE mg/dL
Leukocytes, UA: NEGATIVE
Nitrite, UA: NEGATIVE
Protein Ur, POC: NEGATIVE mg/dL
Spec Grav, UA: <=1.005 — AB (ref 1.010–1.025)
Urobilinogen, UA: 0.2 E.U./dL
pH, UA: 6 (ref 5.0–8.0)

## 2019-09-18 LAB — POC MICROSCOPIC URINALYSIS (UMFC): Mucus: ABSENT

## 2019-09-18 NOTE — Progress Notes (Signed)
4/23/20214:28 PM  Alexander Parker December 20, 1960, 59 y.o., male [id number]  Chief Complaint  Patient presents with  . Hematuria    noticed in underclothes  . Diabetes    range 95 to 164 fasting in am  . blood in R ear canal    4/17- 18     HPI:   Patient is a 59 y.o. male with past medical history significant for DM2, HTN and HLP who presents today for routine followup with several concerns  Last OV dec 2020 - no changes, LFM  Seen in ER 09/07/2019 for blood in right ear - cipordex ggt Saw ENT 2 days later - cont ggt x 1 week, audiology exam if persistent hearing loss Patient reports that right ear is doing well, hearing back to normal  2 days ago noticed specks of blood in underwear No blood in urine but did notice blood at meatus Having mild dysuria and frequency No fever or chills No h/o renal stones No abd pain or flank pain Non smoker  Has been checking cbgs  11 day avg 129 Lowest 88 - + sx, uses glucose tabs if needed Denies any frequent lows  Lab Results  Component Value Date   HGBA1C 7.2 (H) 05/11/2019   HGBA1C 6.4 (A) 11/12/2018   HGBA1C 10.2 (A) 06/23/2018   Lab Results  Component Value Date   MICROALBUR <0.2 04/09/2016   LDLCALC 94 05/11/2019   CREATININE 0.67 (L) 05/11/2019    Depression screen PHQ 2/9 09/18/2019 09/18/2019 05/11/2019  Decreased Interest 0 0 0  Down, Depressed, Hopeless 0 0 0  PHQ - 2 Score 0 0 0  Altered sleeping - - -  Tired, decreased energy - - -  Change in appetite - - -  Feeling bad or failure about yourself  - - -  Trouble concentrating - - -  Moving slowly or fidgety/restless - - -  Suicidal thoughts - - -  PHQ-9 Score - - -  Difficult doing work/chores - - -    Fall Risk  09/18/2019 05/11/2019 11/12/2018 07/21/2018 07/04/2018  Falls in the past year? 1 0 0 0 0  Number falls in past yr: 0 0 0 0 -  Injury with Fall? 0 0 0 0 -  Follow up Falls evaluation completed - - - -     No Known Allergies  Prior to Admission  medications   Medication Sig Start Date End Date Taking? Authorizing Provider  atorvastatin (LIPITOR) 40 MG tablet TAKE ONE TABLET BY MOUTH ONCE DAILY FOR CHOLESTEROL 05/11/19  Yes Rutherford Guys, MD  blood glucose meter kit and supplies KIT Per insurance preference. Use up to three times daily as directed. Dx E11.65, Z79.8. 06/23/18  Yes Rutherford Guys, MD  ciprofloxacin-dexamethasone (CIPRODEX) OTIC suspension INSTILL 4 DROPS INTO RIGHT EAR TWICE DAILY FOR 5 DAYS 09/06/19  Yes [provider]  CONTOUR NEXT TEST test strip USE 1 STRIP TO CHECK GLUCOSE THREE TIMES DAILY AS DIRECTED 08/21/19  Yes Rutherford Guys, MD  glipiZIDE (GLUCOTROL) 10 MG tablet TAKE 1 TABLET BY MOUTH TWICE DAILY BEFORE A MEAL 05/11/19  Yes Rutherford Guys, MD  Insulin Pen Needle (PEN NEEDLES) 32G X 6 MM MISC 1 each by Does not apply route daily. 06/23/18  Yes Rutherford Guys, MD  lisinopril (ZESTRIL) 20 MG tablet Take 1 tablet by mouth once daily 05/25/19  Yes Rutherford Guys, MD  metFORMIN (GLUCOPHAGE) 1000 MG tablet Take 1 tablet (1,000 mg total) by mouth  2 (two) times daily with a meal. 05/11/19  Yes Rutherford Guys, MD  Microlet Lancets MISC USE 1 TO CHECK GLUCOSE THREE TIMES DAILY AS DIRECTED. 08/20/19  Yes Rutherford Guys, MD  ciprofloxacin-dexamethasone (CIPRODEX) OTIC suspension INSTILL 4 DROPS INTO RIGHT EAR TWICE DAILY FOR 5 DAYS 09/06/19   [provider]    Past Medical History:  Diagnosis Date  . Diabetes mellitus without complication ([place])   . Hyperlipidemia   . Hypertension     Past Surgical History:  Procedure Laterality Date  . CIRCUMCISION      Social History   Tobacco Use  . Smoking status: Never Smoker  . Smokeless tobacco: Never Used  Substance Use Topics  . Alcohol use: No    Alcohol/week: 0.0 standard drinks    Family History  Problem Relation Age of Onset  . Diabetes Father   . Esophageal cancer Maternal Uncle   . Colon cancer Neg Hx   . Colon polyps Neg Hx    . Rectal cancer Neg Hx   . Stomach cancer Neg Hx     Review of Systems  Constitutional: Negative for chills and fever.  Respiratory: Negative for cough and shortness of breath.   Cardiovascular: Negative for chest pain, palpitations and leg swelling.  Gastrointestinal: Negative for abdominal pain, nausea and vomiting.   Per hpi  OBJECTIVE:  Today's Vitals   09/18/19 1616  BP: 138/86  Pulse: 89  Temp: 98.3 F (36.8 C)  SpO2: 97%  Weight: 151 lb (68.5 kg)  Height: '5\' 3"'  (1.6 m)   Body mass index is 26.75 kg/m.   Physical Exam Vitals and nursing note reviewed. Exam conducted with a chaperone present.  Constitutional:      Appearance: He is well-developed.  HENT:     Head: Normocephalic and atraumatic.  Eyes:     Conjunctiva/sclera: Conjunctivae normal.     Pupils: Pupils are equal, round, and reactive to light.  Cardiovascular:     Rate and Rhythm: Normal rate and regular rhythm.     Heart sounds: No murmur. No friction rub. No gallop.   Pulmonary:     Effort: Pulmonary effort is normal.     Breath sounds: Normal breath sounds. No wheezing or rales.  Genitourinary:    Penis: Normal and uncircumcised. No erythema, discharge, swelling or lesions.   Musculoskeletal:     Cervical back: Neck supple.  Skin:    General: Skin is warm and dry.  Neurological:     Mental Status: He is alert and oriented to person, place, and time.     Results for orders placed or performed in visit on 09/18/19 (from the past 24 hour(s))  POCT urinalysis dipstick     Status: Abnormal   Collection Time: 09/18/19  4:53 PM  Result Value Ref Range   Color, UA yellow yellow   Clarity, UA clear clear   Glucose, UA negative negative mg/dL   Bilirubin, UA negative negative   Ketones, POC UA negative negative mg/dL   Spec Grav, UA <=1.005 (A) 1.010 - 1.025   Blood, UA small (A) negative   pH, UA 6.0 5.0 - 8.0   Protein Ur, POC negative negative mg/dL   Urobilinogen, UA 0.2 0.2 or 1.0  E.U./dL   Nitrite, UA Negative Negative   Leukocytes, UA Negative Negative  POCT Microscopic Urinalysis ([facility])     Status: Abnormal   Collection Time: 09/18/19  4:58 PM  Result Value Ref Range   WBC,UR,HPF,POC None None  WBC/hpf   RBC,UR,HPF,POC None None RBC/hpf   Bacteria None None, Too numerous to count   Mucus Absent Absent   Epithelial Cells, UR Per Microscopy Few (A) None, Too numerous to count cells/hpf    No results found.   ASSESSMENT and PLAN  1. Type 2 diabetes mellitus without complication, without long-term current use of insulin ([place]) Checking labs today, medications will be adjusted as needed.  - Hemoglobin A1c - CMP14+EGFR - CBC  2. Hematuria, unspecified type Resolved. Non smoker. None on UA today, discussed options, patient would like to cont to monitor as resolved.  - POCT urinalysis dipstick - POCT Microscopic Urinalysis ([facility])  3. Pure hypercholesterolemia Controlled. Continue current regime.   4. Essential hypertension Controlled. Continue current regime.   Other orders   Return in about 3 months (around 12/18/2019).    Rutherford Guys, MD Primary Care at [facility] [place], [place] [address] Ph.  [phone] Fax [phone]

## 2019-09-18 NOTE — Patient Instructions (Signed)
° ° ° °  If you have lab work done today you will be contacted with your lab results within the next 2 weeks.  If you have not heard from us then please contact us. The fastest way to get your results is to register for My Chart. ° ° °IF you received an x-ray today, you will receive an invoice from [facility]. Please contact [facility] at [phone] with questions or concerns regarding your invoice.  ° °IF you received labwork today, you will receive an invoice from [facility]. Please contact [facility] at [phone] with questions or concerns regarding your invoice.  ° °Our billing staff will not be able to assist you with questions regarding bills from these companies. ° °You will be contacted with the lab results as soon as they are available. The fastest way to get your results is to activate your My Chart account. Instructions are located on the last page of this paperwork. If you have not heard from us regarding the results in 2 weeks, please contact this office. °  ° ° ° °

## 2019-09-19 LAB — CBC
Hematocrit: 45.3 % (ref 37.5–51.0)
Hemoglobin: 15.6 g/dL (ref 13.0–17.7)
MCH: 30.1 pg (ref 26.6–33.0)
MCHC: 34.4 g/dL (ref 31.5–35.7)
MCV: 88 fL (ref 79–97)
Platelets: 258 10*3/uL (ref 150–450)
RBC: 5.18 x10E6/uL (ref 4.14–5.80)
RDW: 12.7 % (ref 11.6–15.4)
WBC: 7.4 10*3/uL (ref 3.4–10.8)

## 2019-09-19 LAB — CMP14+EGFR
ALT: 16 IU/L (ref 0–44)
AST: 14 IU/L (ref 0–40)
Albumin/Globulin Ratio: 1.6 (ref 1.2–2.2)
Albumin: 4.5 g/dL (ref 3.8–4.9)
Alkaline Phosphatase: 77 IU/L (ref 39–117)
BUN/Creatinine Ratio: 24 — ABNORMAL HIGH (ref 9–20)
BUN: 16 mg/dL (ref 6–24)
Bilirubin Total: 0.5 mg/dL (ref 0.0–1.2)
CO2: 26 mmol/L (ref 20–29)
Calcium: 9.9 mg/dL (ref 8.7–10.2)
Chloride: 101 mmol/L (ref 96–106)
Creatinine, Ser: 0.67 mg/dL — ABNORMAL LOW (ref 0.76–1.27)
GFR calc Af Amer: 123 mL/min/{1.73_m2} (ref >59)
GFR calc non Af Amer: 107 mL/min/{1.73_m2} (ref >59)
Globulin, Total: 2.8 g/dL (ref 1.5–4.5)
Glucose: 125 mg/dL — ABNORMAL HIGH (ref 65–99)
Potassium: 4.4 mmol/L (ref 3.5–5.2)
Sodium: 139 mmol/L (ref 134–144)
Total Protein: 7.3 g/dL (ref 6.0–8.5)

## 2019-09-19 LAB — HEMOGLOBIN A1C
Est. average glucose Bld gHb Est-mCnc: 157 mg/dL
Hgb A1c MFr Bld: 7.1 % — ABNORMAL HIGH (ref 4.8–5.6)

## 2019-10-04 ENCOUNTER — Other Ambulatory Visit: Payer: Self-pay | Admitting: Family Medicine

## 2019-10-04 DIAGNOSIS — E119 Type 2 diabetes mellitus without complications: Secondary | ICD-10-CM

## 2019-10-04 DIAGNOSIS — I10 Essential (primary) hypertension: Secondary | ICD-10-CM

## 2019-10-04 NOTE — Telephone Encounter (Signed)
Requested Prescriptions  Pending Prescriptions Disp Refills  . lisinopril (ZESTRIL) 20 MG tablet [Pharmacy Med Name: Lisinopril 20 MG Oral Tablet] 90 tablet 1    Sig: Take 1 tablet by mouth once daily     Cardiovascular:  ACE Inhibitors Failed - 10/04/2019  2:58 PM      Failed - Cr in normal range and within 180 days    Creat  Date Value Ref Range Status  04/09/2016 0.65 (L) 0.70 - 1.33 mg/dL Final    Comment:      For patients > or = 58 years of age: The upper reference limit for Creatinine is approximately 13% higher for people identified as African-American.      Creatinine, Ser  Date Value Ref Range Status  09/18/2019 0.67 (L) 0.76 - 1.27 mg/dL Final         Passed - K in normal range and within 180 days    Potassium  Date Value Ref Range Status  09/18/2019 4.4 3.5 - 5.2 mmol/L Final         Passed - Patient is not pregnant      Passed - Last BP in normal range    BP Readings from Last 1 Encounters:  09/18/19 138/86         Passed - Valid encounter within last 6 months    Recent Outpatient Visits          2 weeks ago Type 2 diabetes mellitus without complication, without long-term current use of insulin ([place])   [facility], Lilia Argue, MD   4 months ago Type 2 diabetes mellitus without complication, without long-term current use of insulin [facility])   [facility], Lilia Argue, MD   10 months ago Type 2 diabetes mellitus with other specified complication, with long-term current use of insulin [facility])   [facility], Lilia Argue, MD   1 year ago Pure hypercholesterolemia   [facility], Lilia Argue, MD   1 year ago Hypoglycemia associated with type 2 diabetes mellitus [facility])   [facility], Lilia Argue, MD      Future Appointments            In 2 months Rutherford Guys, MD Primary Care at [place], [facility]

## 2019-11-09 ENCOUNTER — Ambulatory Visit: Payer: BC Managed Care – PPO | Admitting: Family Medicine

## 2019-11-30 ENCOUNTER — Other Ambulatory Visit: Payer: Self-pay | Admitting: Family Medicine

## 2019-11-30 DIAGNOSIS — E119 Type 2 diabetes mellitus without complications: Secondary | ICD-10-CM

## 2019-12-18 ENCOUNTER — Encounter: Payer: Self-pay | Admitting: Family Medicine

## 2019-12-18 ENCOUNTER — Other Ambulatory Visit: Payer: Self-pay

## 2019-12-18 ENCOUNTER — Ambulatory Visit: Payer: BC Managed Care – PPO | Admitting: Family Medicine

## 2019-12-18 VITALS — BP 127/77 | HR 79 | Temp 98.1°F | Ht 63.0 in | Wt 153.4 lb

## 2019-12-18 DIAGNOSIS — Z1159 Encounter for screening for other viral diseases: Secondary | ICD-10-CM | POA: Diagnosis not present

## 2019-12-18 DIAGNOSIS — E7800 Pure hypercholesterolemia, unspecified: Secondary | ICD-10-CM

## 2019-12-18 DIAGNOSIS — E1165 Type 2 diabetes mellitus with hyperglycemia: Secondary | ICD-10-CM

## 2019-12-18 DIAGNOSIS — Z114 Encounter for screening for human immunodeficiency virus [HIV]: Secondary | ICD-10-CM | POA: Diagnosis not present

## 2019-12-18 DIAGNOSIS — Z23 Encounter for immunization: Secondary | ICD-10-CM

## 2019-12-18 DIAGNOSIS — I10 Essential (primary) hypertension: Secondary | ICD-10-CM | POA: Diagnosis not present

## 2019-12-18 NOTE — Progress Notes (Signed)
7/23/20219:01 AM  Alexander Parker 1960/12/29, 59 y.o., male [id number]  Chief Complaint  Patient presents with  . Follow-up    x45mo on chronic medical conditions    HPI:   Patient is a 59y.o. male with past medical history significant for DM2, HTN and HLP  who presents today for routine followup  Last OV April 2021 - no changes  Overall doing well Has eye doctor in oct with Dr GCarney Livingright eye floaters Has not noticed anymore blood in urine Checking cbgs fasting, brings log for review Today it was 180 (7 day avg 156) Denies any lows Taking metformin 10047mand glipizide 1025mID Takes lisinopril and atorvastatin daily as rx He monitors portions, avoids rice, potatoes, breads, etc Uses corn tortillas Denies any numbness or tingling on his feet No polydipsia or polyuria Tries to exercise about 2 a week Works as comLicensed conveyanceradings from Last 3 Encounters:  12/18/19 153 lb 6.4 oz (69.6 kg)  09/18/19 151 lb (68.5 kg)  09/07/19 155 lb (70.3 kg)   BP Readings from Last 3 Encounters:  12/18/19 127/77  09/18/19 138/86  09/07/19 120/86    Lab Results  Component Value Date   HGBA1C 7.1 (H) 09/18/2019   HGBA1C 7.2 (H) 05/11/2019   HGBA1C 6.4 (A) 11/12/2018   Lab Results  Component Value Date   MICROALBUR <0.2 04/09/2016   LDLCALC 94 05/11/2019   CREATININE 0.67 (L) 09/18/2019    Depression screen PHQ 2/9 12/18/2019 09/18/2019 09/18/2019  Decreased Interest 0 0 0  Down, Depressed, Hopeless 0 0 0  PHQ - 2 Score 0 0 0  Altered sleeping - - -  Tired, decreased energy - - -  Change in appetite - - -  Feeling bad or failure about yourself  - - -  Trouble concentrating - - -  Moving slowly or fidgety/restless - - -  Suicidal thoughts - - -  PHQ-9 Score - - -  Difficult doing work/chores - - -    Fall Risk  12/18/2019 09/18/2019 05/11/2019 11/12/2018 07/21/2018  Falls in the past year? 0 1 0 0 0  Number falls in past yr: 0 0 0 0 0  Injury with  Fall? 0 0 0 0 0  Follow up Falls evaluation completed Falls evaluation completed - - -     No Known Allergies  Prior to Admission medications   Medication Sig Start Date End Date Taking? Authorizing Provider  atorvastatin (LIPITOR) 40 MG tablet TAKE ONE TABLET BY MOUTH ONCE DAILY FOR CHOLESTEROL 05/11/19  Yes [place] GuysD  blood glucose meter kit and supplies KIT Per insurance preference. Use up to three times daily as directed. Dx E11.65, Z79.8. 06/23/18  Yes [place] GuysD  CONTOUR NEXT TEST test strip USE 1 STRIP TO CHECK GLUCOSE THREE TIMES DAILY AS DIRECTED 08/21/19  Yes [place] GuysD  glipiZIDE (GLUCOTROL) 10 MG tablet TAKE 1 TABLET BY MOUTH TWICE DAILY BEFORE A MEAL 05/11/19  Yes [place]ford GuysD  Insulin Pen Needle (PEN NEEDLES) 32G X 6 MM MISC 1 each by Does not apply route daily. 06/23/18  Yes [place] GuysD  lisinopril (ZESTRIL) 20 MG tablet Take 1 tablet by mouth once daily 10/04/19  Yes [place]utherford GuysD  metFORMIN (GLUCOPHAGE) 1000 MG tablet TAKE 1 TABLET BY MOUTH TWICE DAILY WITH  A  MEAL. 12/01/19  Yes SanRutherford GuysD  Microlet Lancets MISC USE 1 TO CHECK GLUCOSE  THREE TIMES DAILY AS DIRECTED. 08/20/19  Yes Rutherford Guys, MD    Past Medical History:  Diagnosis Date  . Diabetes mellitus without complication ([place])   . Hyperlipidemia   . Hypertension     Past Surgical History:  Procedure Laterality Date  . CIRCUMCISION      Social History   Tobacco Use  . Smoking status: Never Smoker  . Smokeless tobacco: Never Used  Substance Use Topics  . Alcohol use: No    Alcohol/week: 0.0 standard drinks    Family History  Problem Relation Age of Onset  . Diabetes Father   . Esophageal cancer Maternal Uncle   . Colon cancer Neg Hx   . Colon polyps Neg Hx   . Rectal cancer Neg Hx   . Stomach cancer Neg Hx     Review of Systems  Constitutional: Negative for chills and fever.  Respiratory: Negative for cough and shortness of breath.    Cardiovascular: Negative for chest pain, palpitations and leg swelling.  Gastrointestinal: Negative for abdominal pain, nausea and vomiting.     OBJECTIVE:  Today's Vitals   12/18/19 0846  BP: 127/77  Pulse: 79  Temp: 98.1 F (36.7 C)  TempSrc: Temporal  SpO2: 97%  Weight: 153 lb 6.4 oz (69.6 kg)  Height: '5\' 3"'  (1.6 m)   Body mass index is 27.17 kg/m.   Physical Exam Vitals and nursing note reviewed.  Constitutional:      Appearance: He is well-developed.  HENT:     Head: Normocephalic and atraumatic.  Eyes:     Conjunctiva/sclera: Conjunctivae normal.     Pupils: Pupils are equal, round, and reactive to light.  Cardiovascular:     Rate and Rhythm: Normal rate and regular rhythm.     Heart sounds: No murmur heard.  No friction rub. No gallop.   Pulmonary:     Effort: Pulmonary effort is normal.     Breath sounds: Normal breath sounds. No wheezing, rhonchi or rales.  Musculoskeletal:     Cervical back: Neck supple.     Right lower leg: No edema.     Left lower leg: No edema.  Skin:    General: Skin is warm and dry.  Neurological:     Mental Status: He is alert and oriented to person, place, and time.      Diabetic Foot Form - Detailed   Diabetic Foot Exam - detailed Diabetic Foot exam was performed with the following findings: Yes 12/18/2019  8:57 AM  Visual Foot Exam completed.: Yes  Can the patient see the bottom of their feet?: Yes Are the shoes appropriate in style and fit?: Yes Is there swelling or and abnormal foot shape?: No Is there a claw toe deformity?: No Is there elevated skin temparature?: No Is there foot or ankle muscle weakness?: No Normal Range of Motion: Yes Pulse Foot Exam completed.: Yes  Semmes-Weinstein Monofilament Test       No results found for this or any previous visit (from the past 24 hour(s)).  No results found.   ASSESSMENT and PLAN  1. Uncontrolled type 2 diabetes mellitus with hyperglycemia ([place]) Checking labs  today, medications will be adjusted as needed. Discussed LFM. On ACE and statin. Scheduled for yearly eye exam. Normal foot exam. - Hemoglobin A1c  2. Pure hypercholesterolemia Checking labs today, medications will be adjusted as needed.  - CMP14+EGFR - Lipid panel  3. Essential hypertension Controlled. Continue current regime.   4. Screening for HIV (human  immunodeficiency virus) - HIV antibody (with reflex)  5. Encounter for hepatitis C screening test for low risk patient - Hepatitis C antibody  6. Need for Tdap vaccination - Tdap vaccine greater than or equal to 7yo IM - given today  Return in about 6 months (around 06/19/2020).    Rutherford Guys, MD [facility] [place], [place] [address] Ph.  [phone] Fax [phone]

## 2019-12-18 NOTE — Patient Instructions (Signed)
° ° ° °  If you have lab work done today you will be contacted with your lab results within the next 2 weeks.  If you have not heard from us then please contact us. The fastest way to get your results is to register for My Chart. ° ° °IF you received an x-ray today, you will receive an invoice from [facility]. Please contact [facility] at [phone] with questions or concerns regarding your invoice.  ° °IF you received labwork today, you will receive an invoice from [facility]. Please contact [facility] at [phone] with questions or concerns regarding your invoice.  ° °Our billing staff will not be able to assist you with questions regarding bills from these companies. ° °You will be contacted with the lab results as soon as they are available. The fastest way to get your results is to activate your My Chart account. Instructions are located on the last page of this paperwork. If you have not heard from us regarding the results in 2 weeks, please contact this office. °  ° ° ° °

## 2019-12-19 LAB — CMP14+EGFR
ALT: 31 IU/L (ref 0–44)
AST: 17 IU/L (ref 0–40)
Albumin/Globulin Ratio: 1.5 (ref 1.2–2.2)
Albumin: 4.1 g/dL (ref 3.8–4.9)
Alkaline Phosphatase: 75 IU/L (ref 48–121)
BUN/Creatinine Ratio: 18 (ref 9–20)
BUN: 11 mg/dL (ref 6–24)
Bilirubin Total: 0.4 mg/dL (ref 0.0–1.2)
CO2: 23 mmol/L (ref 20–29)
Calcium: 9.4 mg/dL (ref 8.7–10.2)
Chloride: 98 mmol/L (ref 96–106)
Creatinine, Ser: 0.62 mg/dL — ABNORMAL LOW (ref 0.76–1.27)
GFR calc Af Amer: 127 mL/min/{1.73_m2} (ref >59)
GFR calc non Af Amer: 110 mL/min/{1.73_m2} (ref >59)
Globulin, Total: 2.7 g/dL (ref 1.5–4.5)
Glucose: 194 mg/dL — ABNORMAL HIGH (ref 65–99)
Potassium: 4.8 mmol/L (ref 3.5–5.2)
Sodium: 138 mmol/L (ref 134–144)
Total Protein: 6.8 g/dL (ref 6.0–8.5)

## 2019-12-19 LAB — LIPID PANEL
Chol/HDL Ratio: 3.4 ratio (ref 0.0–5.0)
Cholesterol, Total: 116 mg/dL (ref 100–199)
HDL: 34 mg/dL — ABNORMAL LOW (ref >39)
LDL Chol Calc (NIH): 70 mg/dL (ref 0–99)
Triglycerides: 54 mg/dL (ref 0–149)
VLDL Cholesterol Cal: 12 mg/dL (ref 5–40)

## 2019-12-19 LAB — HEMOGLOBIN A1C
Est. average glucose Bld gHb Est-mCnc: 189 mg/dL
Hgb A1c MFr Bld: 8.2 % — ABNORMAL HIGH (ref 4.8–5.6)

## 2019-12-19 LAB — HIV ANTIBODY (ROUTINE TESTING W REFLEX): HIV Screen 4th Generation wRfx: NONREACTIVE

## 2019-12-19 LAB — HEPATITIS C ANTIBODY: Hep C Virus Ab: <0.1 s/co ratio (ref 0.0–0.9)

## 2019-12-31 ENCOUNTER — Other Ambulatory Visit: Payer: Self-pay | Admitting: Family Medicine

## 2019-12-31 MED ORDER — SITAGLIPTIN PHOSPHATE 100 MG PO TABS: 100.0000 mg | ORAL_TABLET | Freq: Every day | ORAL | 1 refills | Status: DC

## 2020-01-08 ENCOUNTER — Telehealth: Payer: Self-pay | Admitting: Family Medicine

## 2020-01-08 DIAGNOSIS — E119 Type 2 diabetes mellitus without complications: Secondary | ICD-10-CM

## 2020-01-08 NOTE — Telephone Encounter (Signed)
Please provide him with a pharmaceutical discount/savings card. thanks

## 2020-01-08 NOTE — Telephone Encounter (Signed)
Applied for discount through [place] and faxed coverage form to [place] hoping this will lower the cost.

## 2020-01-08 NOTE — Telephone Encounter (Signed)
You prescribed Januvia for the pt unfortunately this is $300 even with insurance and apparently Good Rx coupons do not stack with his insurance is there an alternative he could take instead?

## 2020-01-08 NOTE — Telephone Encounter (Signed)
Pt can not afford [place] and would like to know if he can get a coupon to help lower the cost of medication. [facility] [place] in [place]. Pt last seen 12/18/2019

## 2020-01-13 NOTE — Telephone Encounter (Signed)
Pt called back to report that the medicine is still too expensive and asking for another prescription. Please advise

## 2020-01-13 NOTE — Telephone Encounter (Signed)
Pt called back and reported even with coupon from [place] website the medication is too expensive is there an alternative?

## 2020-01-14 MED ORDER — GLIPIZIDE 10 MG PO TABS: ORAL_TABLET | ORAL | 1 refills | Status: DC

## 2020-01-14 MED ORDER — PIOGLITAZONE HCL 15 MG PO TABS: 15.0000 mg | ORAL_TABLET | Freq: Every day | ORAL | 1 refills | Status: DC

## 2020-01-14 NOTE — Telephone Encounter (Signed)
Patient was informed rx has been sent and he is to tale 15 mg daily

## 2020-01-14 NOTE — Telephone Encounter (Signed)
Please let patient know that I sent in a prescription for actos 15mg  daily. thanks

## 2020-01-27 ENCOUNTER — Other Ambulatory Visit: Payer: Self-pay | Admitting: Family Medicine

## 2020-03-01 ENCOUNTER — Other Ambulatory Visit: Payer: Self-pay | Admitting: Family Medicine

## 2020-03-01 DIAGNOSIS — E119 Type 2 diabetes mellitus without complications: Secondary | ICD-10-CM

## 2020-03-01 NOTE — Telephone Encounter (Signed)
Requested Prescriptions  Pending Prescriptions Disp Refills   metFORMIN (GLUCOPHAGE) 1000 MG tablet [Pharmacy Med Name: metFORMIN HCl 1000 MG Oral Tablet] 180 tablet 1    Sig: TAKE 1 TABLET BY MOUTH TWICE DAILY WITH A MEAL     Endocrinology:  Diabetes - Biguanides Failed - 03/01/2020  6:51 PM      Failed - Cr in normal range and within 360 days    Creat  Date Value Ref Range Status  04/09/2016 0.65 (L) 0.70 - 1.33 mg/dL Final    Comment:      For patients > or = 58 years of age: The upper reference limit for Creatinine is approximately 13% higher for people identified as African-American.      Creatinine, Ser  Date Value Ref Range Status  12/18/2019 0.62 (L) 0.76 - 1.27 mg/dL Final         Failed - HBA1C is between 0 and 7.9 and within 180 days    Hgb A1c MFr Bld  Date Value Ref Range Status  12/18/2019 8.2 (H) 4.8 - 5.6 % Final    Comment:             Prediabetes: 5.7 - 6.4          Diabetes: >6.4          Glycemic control for adults with diabetes: <7.0          Passed - eGFR in normal range and within 360 days    GFR, Est African American  Date Value Ref Range Status  03/27/2015 >89 >=60 mL/min Final   GFR calc Af Amer  Date Value Ref Range Status  12/18/2019 127 >59 mL/min/1.73 Final    Comment:    **[facility] currently reports eGFR in compliance with the current**   recommendations of the [employer]. [facility] will   update reporting as new guidelines are published from the NKF-ASN   Task force.    GFR, Est Non African American  Date Value Ref Range Status  03/27/2015 >89 >=60 mL/min Final    Comment:      The estimated GFR is a calculation valid for adults (>=14 years old) that uses the CKD-EPI algorithm to adjust for age and sex. It is   not to be used for children, pregnant women, hospitalized patients,    patients on dialysis, or with rapidly changing kidney function. According to the NKDEP, eGFR >89 is normal, 60-89 shows  mild impairment, 30-59 shows moderate impairment, 15-29 shows severe impairment and <15 is ESRD.      GFR calc non Af Amer  Date Value Ref Range Status  12/18/2019 110 >59 mL/min/1.73 Final         Passed - Valid encounter within last 6 months    Recent Outpatient Visits          2 months ago Uncontrolled type 2 diabetes mellitus with hyperglycemia [facility])   [facility], Lilia Argue, MD   5 months ago Type 2 diabetes mellitus without complication, without long-term current use of insulin [facility])   [facility], Lilia Argue, MD   9 months ago Type 2 diabetes mellitus without complication, without long-term current use of insulin [facility])   [facility], Lilia Argue, MD   1 year ago Type 2 diabetes mellitus with other specified complication, with long-term current use of insulin [facility])   [facility], Lilia Argue, MD   1 year ago Pure  hypercholesterolemia   [facility], Lilia Argue, MD

## 2020-03-17 ENCOUNTER — Ambulatory Visit: Payer: BC Managed Care – PPO | Admitting: Family Medicine

## 2020-03-17 ENCOUNTER — Other Ambulatory Visit: Payer: Self-pay

## 2020-03-17 ENCOUNTER — Encounter: Payer: Self-pay | Admitting: Family Medicine

## 2020-03-17 VITALS — BP 147/83 | HR 94 | Temp 98.0°F | Ht 63.0 in | Wt 150.4 lb

## 2020-03-17 DIAGNOSIS — S39011A Strain of muscle, fascia and tendon of abdomen, initial encounter: Secondary | ICD-10-CM | POA: Diagnosis not present

## 2020-03-17 DIAGNOSIS — Z789 Other specified health status: Secondary | ICD-10-CM | POA: Diagnosis not present

## 2020-03-17 DIAGNOSIS — R109 Unspecified abdominal pain: Secondary | ICD-10-CM | POA: Diagnosis not present

## 2020-03-17 LAB — POCT URINALYSIS DIP (MANUAL ENTRY)
Bilirubin, UA: NEGATIVE
Blood, UA: NEGATIVE
Glucose, UA: NEGATIVE mg/dL
Leukocytes, UA: NEGATIVE
Nitrite, UA: NEGATIVE
Protein Ur, POC: NEGATIVE mg/dL
Spec Grav, UA: >=1.03 — AB (ref 1.010–1.025)
Urobilinogen, UA: 0.2 E.U./dL
pH, UA: 5 (ref 5.0–8.0)

## 2020-03-17 MED ORDER — DICLOFENAC SODIUM 50 MG PO TBEC: DELAYED_RELEASE_TABLET | ORAL | 0 refills | Status: DC

## 2020-03-17 MED ORDER — METHOCARBAMOL 500 MG PO TABS: ORAL_TABLET | ORAL | 3 refills | Status: DC

## 2020-03-17 NOTE — Patient Instructions (Addendum)
This seems to be a muscular pain in your back.  Take diclofenac 50 mg 1 twice daily for pain and inflammation.  Because you are diabetic I used a lower dose than usual.  Because of that you may need to take some additional Tylenol (acetaminophen) for the pain.  You can safely take 500 mg 2 pills 3 times daily for the pain  Begin methocarbamol (Robaxin) muscle relaxant 1 pill 3 times daily for your back.  I advise you to not work until at least Monday to give this time to start resolving.  Avoid heavy lifting or straining. Distensin lumbosacra Lumbosacral Strain Una distensin lumbosacra es una lesin que causa dolor en la parte inferior de la espalda (columna lumbosacra). Esta lesin suele ocurrir debido al estiramiento excesivo de los msculos o los ligamentos de la columna. Los ligamentos son tejidos similares a cordones que [employer] s. Una distensin puede afectar uno o ms msculos o ligamentos. Cules son las causas? Esta afeccin puede ser causada por lo siguiente:  Un golpe fuerte y directo en la espalda.  Estiramiento excesivo de los msculos de la parte baja de la espalda. Esto puede ser consecuencia de lo siguiente: ? Una cada. ? Levantar un objeto pesado. ? Movimientos repetitivos, como inclinarse o andar en cuclillas. Qu incrementa el riesgo? Los siguientes factores pueden hacer que sea ms propenso a Armed forces training and education officer afeccin:  Participar en deportes o actividades que implican lo siguiente: ? Ardelia Mems torsin repentina de la espalda. ? Empujar o jalar.  Tener sobrepeso u obesidad.  Tener fuerza o flexibilidad deficientes, en especial, tensin en los tendones isquiotibiales o debilidad en los msculos de la espalda o del abdomen.  Tener la parte inferior de la espalda demasiado encorvada.  Tener la pelvis inclinada hacia adelante. Cules son los signos o sntomas? El sntoma principal de esta afeccin es el dolor intenso en la parte inferior de la  espalda, en la zona de la distensin. El dolor tambin puede sentirse en una o ambas piernas. Cmo se diagnostica? Esta afeccin se diagnostica en funcin de los sntomas, los antecedentes mdicos y un examen fsico. Durante el examen, el mdico puede presionarle ciertas zonas de la espalda para Pension scheme manager la causa del dolor. Es posible que le pida que se incline [place] adelante, Gue[place]an atrs y [employer] lados para Conservation officer, historic buildings y la amplitud de [place]. Tambin pueden hacerle estudios de diagnstico por imgenes, como radiografas y Public house manager (RM). Cmo se trata? El tratamiento para esta afeccin puede incluir lo siguiente:  Presenter, broadcasting y fro en la zona afectada.  Tomar medicamentos para Best boy y [employer].  Tomar antiinflamatorios no esteroideos (AINE), como ibuprofeno, para ayudar a Forensic psychologist y las [place].  Hacer ejercicios de elongacin y fortalecimiento para la parte baja de la espalda. Los sntomas generalmente mejoran luego de varias semanas de [place]. Sin embargo, el tiempo de recuperacin vara. Cuando mejoren los sntomas, retome de a poco su rutina normal tan pronto sea posible para Best boy y Product/process development scientist la rigidez y Consulting civil engineer fuerza muscular. Siga estas instrucciones en su casa: Medicamentos  Use los medicamentos de venta libre y los recetados solamente como se lo haya indicado el mdico.  Pregntele al mdico si el medicamento recetado: ? Hace necesario que evite conducir o usar maquinaria pesada. ? Puede causarle estreimiento. Es posible que tenga que tomar estas medidas para prevenir o tratar el estreimiento:  Beba suficiente lquido como para Theatre manager  la orina de color amarillo plido.  Use medicamentos recetados o de [employer].  Consuma alimentos ricos en fibra, como frijoles, cereales integrales, y frutas y verduras frescas.  Limite el consumo de alimentos ricos en grasa y azcares procesados,  como los alimentos fritos o dulces. Control del dolor, la rigidez y la hinchazn      Si se lo indican, aplique hielo sobre la zona de la lesin. Para hacer esto: ? Ponga el hielo en una bolsa plstica. ? Coloque una [employer] piel y Therapist, nutritional. ? Coloque el hielo durante 19minutos, 2 a 3veces por da.  Si se lo indican, aplique calor en la zona afectada con la frecuencia que le haya indicado el mdico. Use la fuente de calor que el mdico le recomiende, como una compresa de calor hmedo o una almohadilla trmica. ? Coloque una [employer] piel y la fuente de Freight forwarder. ? Aplique calor durante 20 a 28minutos. ? Retire la fuente de calor si la piel se pone de color rojo brillante. Esto es especialmente importante si no puede sentir dolor, calor o fro. Puede correr un riesgo mayor de sufrir quemaduras. Actividad  Haga reposo como se lo haya indicado el mdico.  No permanezca en la cama. Hacer reposo en la cama por ms de 1 a 2 das puede demorar su recuperacin.  Retome sus actividades normales segn lo indicado por el mdico. Pregntele al mdico qu actividades son seguras para usted.  Evite las actividades que requieran mucho desgaste de energa durante el tiempo que le haya indicado el mdico.  Haga ejercicio como se lo haya indicado el mdico. Esto incluye ejercicios de elongacin y estiramiento. Instrucciones generales  Mantenga una buena postura al sentarse y pararse. No se incline hacia adelante al sentarse ni se encorve al pararse.  No consuma ningn producto que contenga nicotina o tabaco, como cigarrillos, cigarrillos electrnicos y tabaco de Higher education careers adviser. Si necesita ayuda para dejar de fumar, consulte al mdico.  Concurra a todas las visitas de seguimiento como se lo haya indicado el mdico. Esto es importante. Cmo se previene?   Practique deportes y levante objetos pesados de forma correcta.  Adopte una buena postura mientras est sentado y de pie.  Mantenga un  peso saludable.  Duerma en un colchn medianamente duro para brindar apoyo a la espalda.  Haga por lo menos 118minutos de ejercicios de intensidad moderada cada semana, como caminar a paso ligero o hacer gimnasia acutica. Intente realizar una actividad fsica que alivie la tensin en la espalda, como natacin o bicicleta fija.  Mantenga un buen estado fsico, que incluye lo siguiente: ? [place]. ? Flexibilidad. Comunquese con un mdico si:  El dolor de espalda no mejora despus de varias semanas de [place].  Sus sntomas empeoran. Solicite ayuda inmediatamente si:  El dolor de espalda es muy intenso.  No puede pararse ni caminar.  Tiene dificultad para controlar la miccin o las deposiciones.  Siente nuseas o vomita.  Los pies o las piernas se tornan muy fros, plidos o de color [place].  Siente entumecimiento, hormigueo, debilidad o tiene problemas con el uso de los brazos o las piernas.  Le aparecen algunos de los siguientes sntomas: ? Falta de aire. ? Mareos. ? Dolor en las piernas. ? Debilidad en [place]. Resumen  Una distensin lumbosacra es una lesin que causa dolor en la parte inferior de la espalda (columna lumbosacra).  Esta lesin suele ocurrir debido al estiramiento excesivo de los msculos  o los ligamentos de la columna.  Esta afeccin puede deberse a un golpe directo en la parte inferior de la espalda o al estiramiento excesivo de los msculos de esta zona.  Los sntomas generalmente mejoran luego de varias semanas de [place]. Esta informacin no tiene Marine scientist el consejo del mdico. Asegrese de hacerle al mdico cualquier pregunta que tenga. Document Revised: 11/19/2018 Document Reviewed: 11/19/2018 Elsevier Patient Education  [employer].     If you have lab work done today you will be contacted with your lab results within the next 2 weeks.  If you have not heard from [place] then please contact us. The  fastest way to get your results is to register for My Chart.   IF you received an x-ray today, you will receive an invoice from [facility]. Please contact [facility] at [phone] with questions or concerns regarding your invoice.   IF you received labwork today, you will receive an invoice from [place]. Please contact [facility] at [phone] with questions or concerns regarding your invoice.   Our billing staff will not be able to assist you with questions regarding bills from these companies.  You will be contacted with the lab results as soon as they are available. The fastest way to get your results is to activate your My Chart account. Instructions are located on the last page of this paperwork. If you have not heard from [place] regarding the results in 2 weeks, please contact this office.

## 2020-03-17 NOTE — Progress Notes (Signed)
Patient ID: Alexander Parker, male    DOB: [birth year]  Age: 59 y.o. MRN: [id number]  Chief Complaint  Patient presents with  . Back Pain    Pt stated that he hurt his Lt side of his back about 4/5 days a go. He staeted that as he knows, he didi not do anything to hurt it.    Subjective:   Patient has been having pain in his left flank for for 5 days.  He had flown to [place] to his brother's funeral and when he got on the plane on the way back he started having pain.  It is persisted since then.  No nausea or vomiting or urinary problems.  He says once in the past he had a similar pain.  He knows of no injury.  He is a Curator.  He was not carrying a heavy suitcase.  Patient is diabetic.  No history of diabetic nephropathy.  Current allergies, medications, problem list, past/family and social histories reviewed.  Objective:  BP (!) 147/83 (BP Location: Right Arm, Patient Position: Sitting, Cuff Size: Normal)   Pulse 94   Temp 98 F (36.7 C) (Temporal)   Ht 5\' 3"  (1.6 m)   Wt 150 lb 6.4 oz (68.2 kg)   SpO2 96%   BMI 26.64 kg/m   Use the online interpreter.  Abdomen soft without mass or tenderness.  No CVA tenderness.  In the lateral left flank he is quite tender in the muscles.  Does not seem to be tender along the ribs.  Range of motion is satisfactory but anterior flexion and right-sided tilt causes worst pain.  A little pain with posterior flexion.  Urinalysis is normal    Assessment & Plan:   Assessment: 1. Acute left flank pain   2. Flank strain, initial encounter       Plan: Probably a muscular strain somehow.  I do not think it is kidney but will check a urinalysis. Orders Placed This Encounter  Procedures  . POCT urinalysis dipstick    No orders of the defined types were placed in this encounter.        Patient Instructions    This seems to be a muscular pain in your back.  Take diclofenac 50 mg 1 twice daily for pain and inflammation.  Because you are  diabetic I used a lower dose than usual.  Because of that you may need to take some additional Tylenol (acetaminophen) for the pain.  You can safely take 500 mg 2 pills 3 times daily for the pain  Begin methocarbamol (Robaxin) muscle relaxant 1 pill 3 times daily for your back.  I advise you to not work until at least Monday to give this time to start resolving.  Avoid heavy lifting or straining. Distensin lumbosacra Lumbosacral Strain Una distensin lumbosacra es una lesin que causa dolor en la parte inferior de la espalda (columna lumbosacra). Esta lesin suele ocurrir debido al estiramiento excesivo de los msculos o los ligamentos de la columna. Los ligamentos son tejidos similares a cordones que [employer] s. Una distensin puede afectar uno o ms msculos o ligamentos. Cules son las causas? Esta afeccin puede ser causada por lo siguiente:  Un golpe fuerte y directo en la espalda.  Estiramiento excesivo de los msculos de la parte baja de la espalda. Esto puede ser consecuencia de lo siguiente: ? Una cada. ? Levantar un objeto pesado. ? Movimientos repetitivos, como inclinarse o andar en cuclillas. Qu incrementa el riesgo?  Los siguientes factores pueden hacer que sea ms propenso a Armed forces training and education officer afeccin:  Participar en deportes o actividades que implican lo siguiente: ? Ardelia Mems torsin repentina de la espalda. ? Empujar o jalar.  Tener sobrepeso u obesidad.  Tener fuerza o flexibilidad deficientes, en especial, tensin en los tendones isquiotibiales o debilidad en los msculos de la espalda o del abdomen.  Tener la parte inferior de la espalda demasiado encorvada.  Tener la pelvis inclinada hacia adelante. Cules son los signos o sntomas? El sntoma principal de esta afeccin es el dolor intenso en la parte inferior de la espalda, en la zona de la distensin. El dolor tambin puede sentirse en una o ambas piernas. Cmo se diagnostica? Esta afeccin  se diagnostica en funcin de los sntomas, los antecedentes mdicos y un examen fsico. Durante el examen, el mdico puede presionarle ciertas zonas de la espalda para Pension scheme manager la causa del dolor. Es posible que le pida que se incline [place] adelante, [place] atrs y [employer] lados para Conservation officer, historic buildings y la amplitud de [place]. Tambin pueden hacerle estudios de diagnstico por imgenes, como radiografas y Public house manager (RM). Cmo se trata? El tratamiento para esta afeccin puede incluir lo siguiente:  Presenter, broadcasting y fro en la zona afectada.  Tomar medicamentos para Best boy y [employer].  Tomar antiinflamatorios no esteroideos (AINE), como ibuprofeno, para ayudar a Forensic psychologist y las [place].  Hacer ejercicios de elongacin y fortalecimiento para la parte baja de la espalda. Los sntomas generalmente mejoran luego de varias semanas de [place]. Sin embargo, el tiempo de recuperacin vara. Cuando mejoren los sntomas, retome de a poco su rutina normal tan pronto sea posible para Best boy y Product/process development scientist la rigidez y Consulting civil engineer fuerza muscular. Siga estas instrucciones en su casa: Medicamentos  Use los medicamentos de venta libre y los recetados solamente como se lo haya indicado el mdico.  Pregntele al mdico si el medicamento recetado: ? Hace necesario que evite conducir o usar maquinaria pesada. ? Puede causarle estreimiento. Es posible que tenga que tomar estas medidas para prevenir o tratar el estreimiento:  Beba suficiente lquido como para Theatre manager la orina de color amarillo plido.  Use medicamentos recetados o de [employer].  Consuma alimentos ricos en fibra, como frijoles, cereales integrales, y frutas y verduras frescas.  Limite el consumo de alimentos ricos en grasa y azcares procesados, como los alimentos fritos o dulces. Control del dolor, la rigidez y la hinchazn      Si se lo indican, aplique hielo sobre la  zona de la lesin. Para hacer esto: ? Ponga el hielo en una bolsa plstica. ? Coloque una [employer] piel y Therapist, nutritional. ? Coloque el hielo durante 19minutos, 2 a 3veces por da.  Si se lo indican, aplique calor en la zona afectada con la frecuencia que le haya indicado el mdico. Use la fuente de calor que el mdico le recomiende, como una compresa de calor hmedo o una almohadilla trmica. ? Coloque una [employer] piel y la fuente de Freight forwarder. ? Aplique calor durante 20 a 30minutos. ? Retire la fuente de calor si la piel se pone de color rojo brillante. Esto es especialmente importante si no puede sentir dolor, calor o fro. Puede correr un riesgo mayor de sufrir quemaduras. Actividad  Haga reposo como se lo haya indicado el mdico.  No permanezca en la cama. Hacer reposo en la cama por ms de 1 a 2  das puede demorar su recuperacin.  Retome sus actividades normales segn lo indicado por el mdico. Pregntele al mdico qu actividades son seguras para usted.  Evite las actividades que requieran mucho desgaste de energa durante el tiempo que le haya indicado el mdico.  Haga ejercicio como se lo haya indicado el mdico. Esto incluye ejercicios de elongacin y estiramiento. Instrucciones generales  Mantenga una buena postura al sentarse y pararse. No se incline hacia adelante al sentarse ni se encorve al pararse.  No consuma ningn producto que contenga nicotina o tabaco, como cigarrillos, cigarrillos electrnicos y tabaco de Higher education careers adviser. Si necesita ayuda para dejar de fumar, consulte al mdico.  Concurra a todas las visitas de seguimiento como se lo haya indicado el mdico. Esto es importante. Cmo se previene?   Practique deportes y levante objetos pesados de forma correcta.  Adopte una buena postura mientras est sentado y de pie.  Mantenga un peso saludable.  Duerma en un colchn medianamente duro para brindar apoyo a la espalda.  Haga por lo menos 160minutos de  ejercicios de intensidad moderada cada semana, como caminar a paso ligero o hacer gimnasia acutica. Intente realizar una actividad fsica que alivie la tensin en la espalda, como natacin o bicicleta fija.  Mantenga un buen estado fsico, que incluye lo siguiente: ? [place]. ? Flexibilidad. Comunquese con un mdico si:  El dolor de espalda no mejora despus de varias semanas de [place].  Sus sntomas empeoran. Solicite ayuda inmediatamente si:  El dolor de espalda es muy intenso.  No puede pararse ni caminar.  Tiene dificultad para controlar la miccin o las deposiciones.  Siente nuseas o vomita.  Los pies o las piernas se tornan muy fros, plidos o de color [place].  Siente entumecimiento, hormigueo, debilidad o tiene problemas con el uso de los brazos o las piernas.  Le aparecen algunos de los siguientes sntomas: ? Falta de aire. ? Mareos. ? Dolor en las piernas. ? Debilidad en [place]. Resumen  Una distensin lumbosacra es una lesin que causa dolor en la parte inferior de la espalda (columna lumbosacra).  Esta lesin suele ocurrir debido al estiramiento excesivo de los msculos o los ligamentos de la columna.  Esta afeccin puede deberse a un golpe directo en la parte inferior de la espalda o al estiramiento excesivo de los msculos de esta zona.  Los sntomas generalmente mejoran luego de varias semanas de [place]. Esta informacin no tiene Marine scientist el consejo del mdico. Asegrese de hacerle al mdico cualquier pregunta que tenga. Document Revised: 11/19/2018 Document Reviewed: 11/19/2018 Elsevier Patient Education  [employer].     If you have lab work done today you will be contacted with your lab results within the next 2 weeks.  If you have not heard from [place] then please contact us. The fastest way to get your results is to register for My Chart.   IF you received an x-ray today, you will receive an invoice from  [facility]. Please contact [facility] at [phone] with questions or concerns regarding your invoice.   IF you received labwork today, you will receive an invoice from [facility]. Please contact [facility] at [phone] with questions or concerns regarding your invoice.   Our billing staff will not be able to assist you with questions regarding bills from these companies.  You will be contacted with the lab results as soon as they are available. The fastest way to get your results is to activate your My  Chart account. Instructions are located on the last page of this paperwork. If you have not heard from [place] regarding the results in 2 weeks, please contact this office.        No follow-ups on file.   Ruben Reason, MD 03/17/2020

## 2020-04-27 ENCOUNTER — Other Ambulatory Visit: Payer: Self-pay | Admitting: Family Medicine

## 2020-04-27 DIAGNOSIS — E119 Type 2 diabetes mellitus without complications: Secondary | ICD-10-CM

## 2020-04-27 DIAGNOSIS — I10 Essential (primary) hypertension: Secondary | ICD-10-CM

## 2020-04-27 MED ORDER — LISINOPRIL 20 MG PO TABS: 20.0000 mg | ORAL_TABLET | Freq: Every day | ORAL | 0 refills | Status: DC

## 2020-04-27 NOTE — Telephone Encounter (Signed)
Medication Refill - Medication: lisinopril (ZESTRIL) 20 MG tablet  Pt requested thru pharmacy a week ago   Has the patient contacted their pharmacy? Yes.   (Agent: If no, request that the patient contact the pharmacy for the refill.) (Agent: If yes, when and what did the pharmacy advise?)  Preferred Pharmacy (with phone number or street name): [place] ([place]), [place] - [address]  [address] [place] ([place]) [place] [address]  Phone:  [phone] Fax:  [phone]   Agent: Please be advised that RX refills may take up to 3 business days. We ask that you follow-up with your pharmacy.

## 2020-05-26 ENCOUNTER — Other Ambulatory Visit: Payer: Self-pay

## 2020-05-26 ENCOUNTER — Other Ambulatory Visit: Payer: Self-pay | Admitting: Emergency Medicine

## 2020-05-26 DIAGNOSIS — E119 Type 2 diabetes mellitus without complications: Secondary | ICD-10-CM

## 2020-05-26 MED ORDER — ATORVASTATIN CALCIUM 40 MG PO TABS
ORAL_TABLET | ORAL | 3 refills | Status: AC
Start: 2020-05-26 — End: ?

## 2020-05-26 MED ORDER — BLOOD GLUCOSE MONITOR KIT: PACK | 0 refills | Status: AC

## 2020-05-26 NOTE — Telephone Encounter (Signed)
Medication Refill - Medication: blood glucose meter kit and supplies KIT  Pt is completely out of his current supply  Has the patient contacted their pharmacy? Yes.   (Agent: If no, request that the patient contact the pharmacy for the refill.) (Agent: If yes, when and what did the pharmacy advise?)  Preferred Pharmacy (with phone number or street name):  [id number] - [place] ([place]), [place] - [address]  [address] [place] ([place]) [place] [address]  Phone: [phone] Fax: [phone]     Agent: Please be advised that RX refills may take up to 3 business days. We ask that you follow-up with your pharmacy.  

## 2020-05-26 NOTE — Telephone Encounter (Signed)
Requested medication (s) are due for refill today: Yes  Requested medication (s) are on the active medication list: Yes  Last refill:  06/23/18  Future visit scheduled: No  Notes to clinic:  Unable to refill per protocol, expired Rx     Requested Prescriptions  Pending Prescriptions Disp Refills   blood glucose meter kit and supplies KIT 1 each 0    Sig: Per insurance preference. Use up to three times daily as directed. Dx E11.65, Z79.8.      Endocrinology: Diabetes - Testing Supplies Passed - 05/26/2020  4:11 PM      Passed - Valid encounter within last 12 months    Recent Outpatient Visits           2 months ago Acute left flank pain   Primary Care at [facility], Fenton Malling, MD   5 months ago Uncontrolled type 2 diabetes mellitus with hyperglycemia [facility])   Primary Care at [facility], Lilia Argue, MD   8 months ago Type 2 diabetes mellitus without complication, without long-term current use of insulin [facility])   Primary Care at [facility], Lilia Argue, MD   1 year ago Type 2 diabetes mellitus without complication, without long-term current use of insulin [facility])   Primary Care at [facility], Lilia Argue, MD   1 year ago Type 2 diabetes mellitus with other specified complication, with long-term current use of insulin [facility])   Primary Care at [facility], Lilia Argue, MD

## 2020-07-06 ENCOUNTER — Other Ambulatory Visit: Payer: Self-pay | Admitting: Family Medicine

## 2020-07-06 DIAGNOSIS — E119 Type 2 diabetes mellitus without complications: Secondary | ICD-10-CM

## 2020-07-06 DIAGNOSIS — I10 Essential (primary) hypertension: Secondary | ICD-10-CM

## 2020-08-17 ENCOUNTER — Ambulatory Visit: Payer: Managed Care, Other (non HMO) | Admitting: Registered Nurse

## 2020-08-17 ENCOUNTER — Other Ambulatory Visit: Payer: Self-pay

## 2020-08-17 VITALS — BP 133/78 | HR 93 | Temp 97.6°F | Resp 16 | Ht 63.0 in | Wt 154.0 lb

## 2020-08-17 DIAGNOSIS — R351 Nocturia: Secondary | ICD-10-CM | POA: Diagnosis not present

## 2020-08-17 DIAGNOSIS — E119 Type 2 diabetes mellitus without complications: Secondary | ICD-10-CM | POA: Diagnosis not present

## 2020-08-17 DIAGNOSIS — M722 Plantar fascial fibromatosis: Secondary | ICD-10-CM | POA: Diagnosis not present

## 2020-08-17 LAB — POCT URINALYSIS DIP (MANUAL ENTRY)
Bilirubin, UA: NEGATIVE
Glucose, UA: NEGATIVE mg/dL
Ketones, POC UA: NEGATIVE mg/dL
Nitrite, UA: NEGATIVE
Protein Ur, POC: NEGATIVE mg/dL
Spec Grav, UA: 1.015 (ref 1.010–1.025)
Urobilinogen, UA: 0.2 E.U./dL
pH, UA: 5 (ref 5.0–8.0)

## 2020-08-17 LAB — POCT GLYCOSYLATED HEMOGLOBIN (HGB A1C)
HbA1c POC (<> result, manual entry): 7.8 % (ref 4.0–5.6)
Hemoglobin A1C: 7.8 % — AB (ref 4.0–5.6)

## 2020-08-17 LAB — GLUCOSE, POCT (MANUAL RESULT ENTRY): POC Glucose: 148 mg/dl — AB (ref 70–99)

## 2020-08-17 MED ORDER — TAMSULOSIN HCL 0.4 MG PO CAPS
0.4000 mg | ORAL_CAPSULE | Freq: Every day | ORAL | 3 refills | Status: AC
Start: 2020-08-17 — End: ?

## 2020-08-17 NOTE — Progress Notes (Signed)
Established Patient Office Visit  Subjective:  Patient ID: Alexander Parker, male    DOB: 17-Mar-1961  Age: 60 y.o. MRN: [id number]  CC:  Chief Complaint  Patient presents with  . Foot Pain    Pt states that his left foot has been in pain for about a week.  . Medication Refill    Pt states he needs a refill of glucotrol and actos.  . Dysuria    Pt states it hurt at times to urinate, x 1 week    HPI [name] presents for dysuria and foot pain x 1 week. Med refills  Last A1c:  Lab Results  Component Value Date   HGBA1C 8.2 (H) 12/18/2019    Currently taking: metformin 1055m PO bid ac, actos 111mPO qd, glipizide 1027mO bid ac. Per last orders should have run out around 1 mo ago. No new complications Reports good compliance with medications Diet has been steady, no changes Exercise habits have been steady, no changes.  Does note high sugars at home, sometimes up to 300. Asymptomatic. Notes his father recently passed and he has been under stress in helping with arrangements [place]   Dysuria: Mild, end of voiding, no hematuria. Denies flank pain and constitutional symptoms In monogamous relationship for some time, no STI risk States that he sees some "floaters" in urine, do not seem like stones. Usually somewhat darker in appearance.  Foot pain: Bottom of foot and lateral edge L foot On and off Worse with first steps out of bed, sharp pain Did have a recent time where he had used boots for work, generally uses sneakers. Wearing sneakers today, pain not as bad, tylenol helpful as PRN but sometimes not enough  Past Medical History:  Diagnosis Date  . Diabetes mellitus without complication ([place] . Hyperlipidemia   . Hypertension     Past Surgical History:  Procedure Laterality Date  . CIRCUMCISION      Family History  Problem Relation Age of Onset  . Diabetes Father   . Esophageal cancer Maternal Uncle   . Colon cancer  Neg Hx   . Colon polyps Neg Hx   . Rectal cancer Neg Hx   . Stomach cancer Neg Hx     Social History   Socioeconomic History  . Marital status: Married    Spouse name: Not on file  . Number of children: Not on file  . Years of education: Not on file  . Highest education level: Not on file  Occupational History  . Not on file  Tobacco Use  . Smoking status: Never Smoker  . Smokeless tobacco: Never Used  Vaping Use  . Vaping Use: Never used  Substance and Sexual Activity  . Alcohol use: No    Alcohol/week: 0.0 standard drinks  . Drug use: No  . Sexual activity: Yes  Other Topics Concern  . Not on file  Social History Narrative  . Not on file   Social Determinants of Health   Financial Resource Strain: Not on file  Food Insecurity: Not on file  Transportation Needs: Not on file  Physical Activity: Not on file  Stress: Not on file  Social Connections: Not on file  Intimate Partner Violence: Not on file    Outpatient Medications Prior to Visit  Medication Sig Dispense Refill  . ACCU-CHEK GUIDE test strip USE UP TO 3 TIMES DAILY AS DIRECTED 100 each 0  . Accu-Chek Softclix Lancets lancets  USE UP TO 3 TIMES DAILY AS DIRECTED 100 each 0  . atorvastatin (LIPITOR) 40 MG tablet TAKE ONE TABLET BY MOUTH ONCE DAILY FOR CHOLESTEROL 90 tablet 3  . blood glucose meter kit and supplies KIT Per insurance preference. Use up to three times daily as directed. Dx E11.65, Z79.8. 1 each 0  . diclofenac (VOLTAREN) 50 MG EC tablet Take 1 twice daily for pain and inflammation 20 tablet 0  . glipiZIDE (GLUCOTROL) 10 MG tablet TAKE 1 TABLET BY MOUTH TWICE DAILY BEFORE A MEAL 180 tablet 1  . lisinopril (ZESTRIL) 20 MG tablet Take 1 tablet by mouth once daily 90 tablet 0  . metFORMIN (GLUCOPHAGE) 1000 MG tablet TAKE 1 TABLET BY MOUTH TWICE DAILY WITH A MEAL 180 tablet 1  . methocarbamol (ROBAXIN) 500 MG tablet Take 1 pill 3 times daily as needed for muscle relaxant for back pain. 30 tablet 3  .  pioglitazone (ACTOS) 15 MG tablet Take 1 tablet (15 mg total) by mouth daily. 90 tablet 1  . Insulin Pen Needle (PEN NEEDLES) 32G X 6 MM MISC 1 each by Does not apply route daily. (Patient not taking: No sig reported) 100 each 5   No facility-administered medications prior to visit.    No Known Allergies  ROS Review of Systems  Constitutional: Negative.   HENT: Negative.   Eyes: Negative.   Respiratory: Negative.   Cardiovascular: Negative.   Gastrointestinal: Negative.   Endocrine: Negative.   Genitourinary: Positive for dysuria. Negative for decreased urine volume, difficulty urinating, enuresis, flank pain, frequency, genital sores, hematuria, penile discharge, penile pain, penile swelling, scrotal swelling, testicular pain and urgency.  Musculoskeletal: Positive for arthralgias. Negative for back pain, gait problem, joint swelling, myalgias, neck pain and neck stiffness.  Skin: Negative.   Allergic/Immunologic: Negative.   Neurological: Negative.   Hematological: Negative.   Psychiatric/Behavioral: Negative.   All other systems reviewed and are negative.     Objective:    Physical Exam Constitutional:      General: He is not in acute distress.    Appearance: Normal appearance. He is normal weight. He is not ill-appearing, toxic-appearing or diaphoretic.  Cardiovascular:     Rate and Rhythm: Normal rate and regular rhythm.     Heart sounds: Normal heart sounds. No murmur heard. No friction rub. No gallop.   Pulmonary:     Effort: Pulmonary effort is normal. No respiratory distress.     Breath sounds: Normal breath sounds. No stridor. No wheezing, rhonchi or rales.  Chest:     Chest wall: No tenderness.  Musculoskeletal:        General: No swelling, tenderness, deformity or signs of injury. Normal range of motion.     Right lower leg: No edema.     Left lower leg: No edema.  Skin:    General: Skin is warm and dry.  Neurological:     General: No focal deficit  present.     Mental Status: He is alert and oriented to person, place, and time. Mental status is at baseline.  Psychiatric:        Mood and Affect: Mood normal.        Behavior: Behavior normal.        Thought Content: Thought content normal.        Judgment: Judgment normal.     BP 133/78   Pulse 93   Temp 97.6 F (36.4 C) (Temporal)   Resp 16   Ht '5\' 3"'  (1.6  m)   Wt 154 lb (69.9 kg)   SpO2 98%   BMI 27.28 kg/m  Wt Readings from Last 3 Encounters:  08/17/20 154 lb (69.9 kg)  03/17/20 150 lb 6.4 oz (68.2 kg)  12/18/19 153 lb 6.4 oz (69.6 kg)     Health Maintenance Due  Topic Date Due  . COVID-19 Vaccine (1) Never done  . OPHTHALMOLOGY EXAM  12/11/2019  . HEMOGLOBIN A1C  06/19/2020    There are no preventive care reminders to display for this patient.  Lab Results  Component Value Date   TSH 1.390 05/11/2019   Lab Results  Component Value Date   WBC 7.4 09/18/2019   HGB 15.6 09/18/2019   HCT 45.3 09/18/2019   MCV 88 09/18/2019   PLT 258 09/18/2019   Lab Results  Component Value Date   NA 138 12/18/2019   K 4.8 12/18/2019   CO2 23 12/18/2019   GLUCOSE 194 (H) 12/18/2019   BUN 11 12/18/2019   CREATININE 0.62 (L) 12/18/2019   BILITOT 0.4 12/18/2019   ALKPHOS 75 12/18/2019   AST 17 12/18/2019   ALT 31 12/18/2019   PROT 6.8 12/18/2019   ALBUMIN 4.1 12/18/2019   CALCIUM 9.4 12/18/2019   Lab Results  Component Value Date   CHOL 116 12/18/2019   Lab Results  Component Value Date   HDL 34 (L) 12/18/2019   Lab Results  Component Value Date   LDLCALC 70 12/18/2019   Lab Results  Component Value Date   TRIG 54 12/18/2019   Lab Results  Component Value Date   CHOLHDL 3.4 12/18/2019   Lab Results  Component Value Date   HGBA1C 8.2 (H) 12/18/2019      Assessment & Plan:   Problem List Items Addressed This Visit      Endocrine   Diabetes mellitus ([place]) - Primary   Relevant Orders   POCT glycosylated hemoglobin (Hb A1C) (Completed)    POCT urinalysis dipstick (Completed)   POCT glucose (manual entry) (Completed)   Lipid panel (Completed)   CMP14+EGFR (Completed)    Other Visit Diagnoses    Nocturia       Relevant Medications   tamsulosin (FLOMAX) 0.4 MG CAPS capsule   Other Relevant Orders   PSA (Completed)   Plantar fasciitis          No orders of the defined types were placed in this encounter.   Follow-up: No follow-ups on file.   PLAN  a1c today at 7.8, mildly improved form previous but still higher than ideal. Continue to push diet and exercise, med compliance, and follow up in 3 mo  Suspect plantar fasciitis. Discussed conservative measures with patient and use of OTC analgesics. A1c relatively stable though elevated, unilateral symptoms do not point towards neuropathy, but will keep consideration. Can consider referral to podiatry if pain persists or changes  Chief concern of mine today is prostate abnormalities. Last PSA wnl but new symptoms that do not involve pain or gross hematuria concerning for malignancy vs infection, but will start with PSA draw and trial of flomax 0.2m PO qd. Pending results and response to therapy, consider referral to urology  Patient encouraged to call clinic with any questions, comments, or concerns.  RMaximiano Coss NP

## 2020-08-18 LAB — CMP14+EGFR
ALT: 18 IU/L (ref 0–44)
AST: 9 IU/L (ref 0–40)
Albumin/Globulin Ratio: 1.5 (ref 1.2–2.2)
Albumin: 4.2 g/dL (ref 3.8–4.9)
Alkaline Phosphatase: 81 IU/L (ref 44–121)
BUN/Creatinine Ratio: 20 (ref 9–20)
BUN: 13 mg/dL (ref 6–24)
Bilirubin Total: 0.3 mg/dL (ref 0.0–1.2)
CO2: 25 mmol/L (ref 20–29)
Calcium: 9.9 mg/dL (ref 8.7–10.2)
Chloride: 97 mmol/L (ref 96–106)
Creatinine, Ser: 0.66 mg/dL — ABNORMAL LOW (ref 0.76–1.27)
Globulin, Total: 2.8 g/dL (ref 1.5–4.5)
Glucose: 106 mg/dL — ABNORMAL HIGH (ref 65–99)
Potassium: 5 mmol/L (ref 3.5–5.2)
Sodium: 137 mmol/L (ref 134–144)
Total Protein: 7 g/dL (ref 6.0–8.5)
eGFR: 109 mL/min/{1.73_m2} (ref >59)

## 2020-08-18 LAB — LIPID PANEL
Chol/HDL Ratio: 3.9 ratio (ref 0.0–5.0)
Cholesterol, Total: 114 mg/dL (ref 100–199)
HDL: 29 mg/dL — ABNORMAL LOW (ref >39)
LDL Chol Calc (NIH): 70 mg/dL (ref 0–99)
Triglycerides: 69 mg/dL (ref 0–149)
VLDL Cholesterol Cal: 15 mg/dL (ref 5–40)

## 2020-08-18 LAB — PSA: Prostate Specific Ag, Serum: 34.7 ng/mL — ABNORMAL HIGH (ref 0.0–4.0)

## 2020-08-19 ENCOUNTER — Other Ambulatory Visit: Payer: Self-pay | Admitting: Family Medicine

## 2020-08-19 DIAGNOSIS — R10A2 Flank pain, left side: Secondary | ICD-10-CM

## 2020-08-19 DIAGNOSIS — S39011A Strain of muscle, fascia and tendon of abdomen, initial encounter: Secondary | ICD-10-CM

## 2020-08-19 DIAGNOSIS — R109 Unspecified abdominal pain: Secondary | ICD-10-CM

## 2020-08-19 NOTE — Telephone Encounter (Signed)
   Notes to clinic: review for refill  Last filled on 03/14/2020   Requested Prescriptions  Pending Prescriptions Disp Refills   diclofenac (VOLTAREN) 50 MG EC tablet [Pharmacy Med Name: Diclofenac Sodium 50 MG Oral Tablet Delayed Release] 20 tablet 0    Sig: TAKE 1 TABLET BY MOUTH TWICE DAILY FOR PAIN AND FOR INFLAMMATION      Analgesics:  NSAIDS Failed - 08/19/2020  9:03 AM      Failed - Cr in normal range and within 360 days    Creat  Date Value Ref Range Status  04/09/2016 0.65 (L) 0.70 - 1.33 mg/dL Final    Comment:      For patients > or = 59 years of age: The upper reference limit for Creatinine is approximately 13% higher for people identified as African-American.      Creatinine, Ser  Date Value Ref Range Status  08/17/2020 0.66 (L) 0.76 - 1.27 mg/dL Final          Passed - HGB in normal range and within 360 days    Hemoglobin  Date Value Ref Range Status  09/18/2019 15.6 13.0 - 17.7 g/dL Final          Passed - Patient is not pregnant      Passed - Valid encounter within last 12 months    Recent Outpatient Visits           2 days ago Type 2 diabetes mellitus without complication, without long-term current use of insulin ([place])   [facility], NP   5 months ago Acute left flank pain   [facility] at [facility], Fenton Malling, MD   8 months ago Uncontrolled type 2 diabetes mellitus with hyperglycemia [facility])   Primary Care at [facility], Lilia Argue, MD   11 months ago Type 2 diabetes mellitus without complication, without long-term current use of insulin [facility])   Primary Care at [facility], Lilia Argue, MD   1 year ago Type 2 diabetes mellitus without complication, without long-term current use of insulin [facility])   Primary Care at [facility], Lilia Argue, MD

## 2020-08-19 NOTE — Telephone Encounter (Signed)
OV 08/17/2020, pt requesting Diclofenac Sodium 50 mg for Lt flank pain

## 2020-08-22 ENCOUNTER — Encounter: Payer: Self-pay | Admitting: Registered Nurse

## 2020-08-22 ENCOUNTER — Telehealth: Payer: Self-pay | Admitting: Registered Nurse

## 2020-08-22 DIAGNOSIS — R351 Nocturia: Secondary | ICD-10-CM

## 2020-08-22 DIAGNOSIS — R972 Elevated prostate specific antigen [PSA]: Secondary | ICD-10-CM

## 2020-08-22 NOTE — Telephone Encounter (Signed)
Called patient to discuss results Mild improvement in a1c, lipids stable, cmp reassuring Pt does have PSA elevated to 34.7 - with nocturia and urinary abnormalities, concern for malignancy vs. Prostatitis.  Pt does report that flomax has dramatically improved symptoms, no further concerns.  Will refer to urology for further work up including potential biopsy. Pt voices understanding of plan. Questions answered, concerns addressed.  Kathrin Ruddy, NP

## 2020-08-25 ENCOUNTER — Other Ambulatory Visit: Payer: Self-pay | Admitting: Family Medicine

## 2020-08-25 DIAGNOSIS — E119 Type 2 diabetes mellitus without complications: Secondary | ICD-10-CM

## 2020-08-25 DIAGNOSIS — I10 Essential (primary) hypertension: Secondary | ICD-10-CM

## 2020-09-30 ENCOUNTER — Other Ambulatory Visit: Payer: Self-pay | Admitting: Family Medicine

## 2020-11-10 ENCOUNTER — Ambulatory Visit: Payer: Managed Care, Other (non HMO)

## 2020-11-10 ENCOUNTER — Ambulatory Visit (INDEPENDENT_AMBULATORY_CARE_PROVIDER_SITE_OTHER): Payer: Managed Care, Other (non HMO) | Admitting: Podiatry

## 2020-11-10 ENCOUNTER — Other Ambulatory Visit: Payer: Self-pay

## 2020-11-10 DIAGNOSIS — M7750 Other enthesopathy of unspecified foot: Secondary | ICD-10-CM | POA: Diagnosis not present

## 2020-11-10 DIAGNOSIS — M722 Plantar fascial fibromatosis: Secondary | ICD-10-CM | POA: Diagnosis not present

## 2020-11-10 MED ORDER — TRIAMCINOLONE ACETONIDE 10 MG/ML IJ SUSP
10.0000 mg | Freq: Once | INTRAMUSCULAR | Status: AC
  Administered 2020-11-10: 10 mg

## 2020-11-10 NOTE — Patient Instructions (Signed)

## 2020-11-14 NOTE — Progress Notes (Signed)
Subjective:   Patient ID: Alexander Parker, male   DOB: 60 y.o.   MRN: [id number]   HPI 60 year old male presents the office today for concerns of left foot pain.  He states that he twisted his foot in March while living in [place] at that time he had x-rays taken which were told he had no fracture.  He points the lateral aspect of the foot where he gets discomfort he describes a constant achiness to this area going to the arch of the foot as well.  He is diabetic did not take his blood sugar this morning.   Review of Systems  All other systems reviewed and are negative.  Past Medical History:  Diagnosis Date   Diabetes mellitus without complication ([place])    Hyperlipidemia    Hypertension     Past Surgical History:  Procedure Laterality Date   CIRCUMCISION       Current Outpatient Medications:    lisinopril (ZESTRIL) 20 MG tablet, Take by mouth., Disp: , Rfl:    ACCU-CHEK GUIDE test strip, USE UP TO 3 TIMES DAILY AS DIRECTED, Disp: 100 each, Rfl: 0   Accu-Chek Softclix Lancets lancets, USE UP TO 3 TIMES DAILY AS DIRECTED, Disp: 100 each, Rfl: 0   atorvastatin (LIPITOR) 40 MG tablet, TAKE ONE TABLET BY MOUTH ONCE DAILY FOR CHOLESTEROL, Disp: 90 tablet, Rfl: 3   blood glucose meter kit and supplies KIT, Per insurance preference. Use up to three times daily as directed. Dx E11.65, Z79.8., Disp: 1 each, Rfl: 0   diclofenac (VOLTAREN) 50 MG EC tablet, TAKE 1 TABLET BY MOUTH TWICE DAILY FOR PAIN AND FOR INFLAMMATION, Disp: 20 tablet, Rfl: 0   diclofenac Sodium (VOLTAREN) 1 % GEL, diclofenac 1 % topical gel  APPLY 2 GRAMS TO THE AFFECTED AREA(S) BY TOPICAL ROUTE 4 TIMES PER DAY, Disp: , Rfl:    glipiZIDE (GLUCOTROL) 10 MG tablet, TAKE 1 TABLET BY MOUTH TWICE DAILY BEFORE A MEAL, Disp: 180 tablet, Rfl: 1   insulin glargine (LANTUS) 100 UNIT/ML injection, Inject into the skin., Disp: , Rfl:    Insulin Pen Needle (PEN NEEDLES) 32G X 6 MM MISC, 1 each by Does not apply route daily. (Patient not  taking: No sig reported), Disp: 100 each, Rfl: 5   lisinopril (ZESTRIL) 20 MG tablet, Take 1 tablet by mouth once daily, Disp: 90 tablet, Rfl: 0   metFORMIN (GLUCOPHAGE) 1000 MG tablet, TAKE 1 TABLET BY MOUTH TWICE DAILY WITH A MEAL, Disp: 180 tablet, Rfl: 1   methocarbamol (ROBAXIN) 500 MG tablet, Take 1 pill 3 times daily as needed for muscle relaxant for back pain., Disp: 30 tablet, Rfl: 3   pioglitazone (ACTOS) 15 MG tablet, Take 1 tablet (15 mg total) by mouth daily., Disp: 90 tablet, Rfl: 1   tamsulosin (FLOMAX) 0.4 MG CAPS capsule, Take 1 capsule (0.4 mg total) by mouth daily., Disp: 30 capsule, Rfl: 3  No Known Allergies        Objective:  Physical Exam  General: AAO x3, NAD-presents with translator  Dermatological: Skin is warm, dry and supple bilateral. There are no open sores, no preulcerative lesions, no rash or signs of infection present.  Vascular: Dorsalis Pedis artery and Posterior Tibial artery pedal pulses are 2/4 bilateral with immedate capillary fill time.  There is no pain with calf compression, swelling, warmth, erythema.   Neruologic: Grossly intact via light touch bilateral.   Musculoskeletal: There is tenderness palpation to the lateral aspect of the foot proximal to  the fifth metatarsal base plantarly.  He has been on the lateral plane of the plantar fascia.  There is no area of pinpoint tenderness there is no edema, erythema.  Flexor, extensor tendons appear to be intact.  MMT 5/5.  Gait: Unassisted, Nonantalgic.       Assessment:   60 year old male with tendinitis left foot     Plan:  -Treatment options discussed including all alternatives, risks, and complications -Etiology of symptoms were discussed -Held off on x-ray at his request. -Steroid injection performed.  Skin was prepped with alcohol and a mixture of 1 cc Kenalog 10, 1 cc lidocaine plain was infiltrated into the area of tenderness lateral aspect of the heel.  Tolerated procedure without  complications.  Postinjection care discussed. -Prescribed mobic. Discussed side effects of the medication and directed to stop if any are to occur and call the office.  -Discussed stretching, icing daily. -Discussed shoe modifications  Return in about 6 weeks (around 12/22/2020), or if symptoms worsen or fail to improve.  Trula Slade DPM

## 2020-12-22 ENCOUNTER — Other Ambulatory Visit: Payer: Self-pay

## 2020-12-22 ENCOUNTER — Ambulatory Visit (INDEPENDENT_AMBULATORY_CARE_PROVIDER_SITE_OTHER): Payer: Managed Care, Other (non HMO) | Admitting: Podiatry

## 2020-12-22 DIAGNOSIS — M7752 Other enthesopathy of left foot: Secondary | ICD-10-CM

## 2020-12-22 DIAGNOSIS — M7750 Other enthesopathy of unspecified foot: Secondary | ICD-10-CM

## 2020-12-22 MED ORDER — DEXAMETHASONE SODIUM PHOSPHATE 120 MG/30ML IJ SOLN
4.0000 mg | Freq: Once | INTRAMUSCULAR | Status: AC
  Administered 2020-12-22: 4 mg via INTRA_ARTICULAR

## 2020-12-22 NOTE — Patient Instructions (Signed)
Meloxicam tablets What is this medication? MELOXICAM (mel OX i cam) is a non-steroidal anti-inflammatory drug, also knownas an NSAID. It is used to treat pain, inflammation, and swelling. This medicine may be used for other purposes; ask your health care provider orpharmacist if you have questions. COMMON BRAND NAME(S): Mobic What should I tell my care team before I take this medication? They need to know if you have any of these conditions: asthma (lung or breathing disease) bleeding disorder coronary artery bypass graft (CABG) within the past 2 weeks dehydration heart attack heart disease heart failure high blood pressure if you often drink alcohol kidney disease liver disease smoke tobacco cigarettes stomach bleeding stomach ulcers, other stomach or intestine problems take medicines that treat or prevent blood clots taking other steroids like dexamethasone or prednisone an unusual or allergic reaction to meloxicam, other medicines, foods, dyes, or preservatives pregnant or trying to get pregnant breast-feeding How should I use this medication? Take this medicine by mouth. Take it as directed on the prescription label at the same time every day. You can take it with or without food. If it upsets your stomach, take it with food. Do not use it more often than directed. There may be unused or extra doses in the bottle after you finish your treatment.Talk to your health care provider if you have questions about your dose. A special MedGuide will be given to you by the pharmacist with eachprescription and refill. Be sure to read this information carefully each time. Talk to your health care provider about the use of this medicine in children.Special care may be needed. Patients over 18 years of age may have a stronger reaction and need a smallerdose. Overdosage: If you think you have taken too much of this medicine contact apoison control center or emergency room at once. NOTE: This  medicine is only for you. Do not share this medicine with others. What if I miss a dose? If you miss a dose, take it as soon as you can. If it is almost time for yournext dose, take only that dose. Do not take double or extra doses. What may interact with this medication? Do not take this medicine with any of the following medications: cidofovir ketorolac This medicine may also interact with the following medications: aspirin and aspirin-like medicines certain medicines for blood pressure, heart disease, irregular heart beat certain medicines for depression, anxiety, or psychotic disturbances certain medicines that treat or prevent blood clots like warfarin, enoxaparin, dalteparin, apixaban, dabigatran, rivaroxaban cyclosporine diuretics fluconazole lithium methotrexate other NSAIDs, medicines for pain and inflammation, like ibuprofen and naproxen pemetrexed This list may not describe all possible interactions. Give your health care provider a list of all the medicines, herbs, non-prescription drugs, or dietary supplements you use. Also tell them if you smoke, drink alcohol, or use illegaldrugs. Some items may interact with your medicine. What should I watch for while using this medication? Visit your health care provider for regular checks on your progress. Tell your health care provider if your symptoms do not start to get better or if they getworse. Do not take other medicines that contain aspirin, ibuprofen, or naproxen with this medicine. Side effects such as stomach upset, nausea, or ulcers may be more likely to occur. Many non-prescription medicines contain aspirin,ibuprofen, or naproxen. Always read labels carefully. This medicine can cause serious ulcers and bleeding in the stomach. It can happen with no warning. Smoking, drinking alcohol, older age, and poor health can also increase risks. Call your  health care provider right away if you havestomach pain or blood in your vomit or  stool. This medicine does not prevent a heart attack or stroke. This medicine may increase the chance of a heart attack or stroke. The chance may increase the longer you use this medicine or if you have heart disease. If you take aspirin to prevent a heart attack or stroke, talk to your health care provider aboutusing this medicine. Alcohol may interfere with the effect of this medicine. Avoid alcoholic drinks. This medicine may cause serious skin reactions. They can happen weeks to months after starting the medicine. Contact your health care provider right away if you notice fevers or flu-like symptoms with a rash. The rash may be red or purple and then turn into blisters or peeling of the skin. Or, you might notice a red rash with swelling of the face, lips or lymph nodes in your neck or underyour arms. Talk to your health care provider if you are pregnant before taking this medicine. Taking this medicine between weeks 20 and 30 of pregnancy may harm your unborn baby. Your health care provider will monitor you closely if youneed to take it. After 30 weeks of pregnancy, do not take this medicine. You may get drowsy or dizzy. Do not drive, use machinery, or do anything that needs mental alertness until you know how this medicine affects you. Do not stand up or sit up quickly, especially if you are an older patient. Thisreduces the risk of dizzy or fainting spells. Be careful brushing or flossing your teeth or using a toothpick because you may get an infection or bleed more easily. If you have any dental work done, Primary school teacher you are receiving this medicine. This medicine may make it more difficult to get pregnant. Talk to your healthcare provider if you are concerned about your fertility. What side effects may I notice from receiving this medication? Side effects that you should report to your doctor or health care professionalas soon as possible: allergic reactions (skin rash, itching or hives;  swelling of the face, lips, or tongue) bleeding (bloody or black, tarry stools; red or dark brown urine; spitting up blood or brown material that looks like coffee grounds; red spots on the skin; unusual bruising or bleeding from the eyes, gums, or nose) blood clot (chest pain; shortness of breath; pain, swelling, or warmth in the leg) general ill feeling or flu-like symptoms high potassium levels (chest pain; fast, irregular heartbeat; muscle weakness) kidney injury (trouble passing urine or change in the amount of urine) light-colored stool liver injury (dark yellow or brown urine; general ill feeling or flu-like symptoms; loss of appetite, right upper belly pain; unusually weak or tired, yellowing of the eyes or skin) low red blood cell counts (trouble breathing; feeling faint; lightheaded, falls; unusually weak or tired) rash, fever, and swollen lymph nodes redness, blistering, peeling, or loosening of the skin, including inside the mouth stroke (changes in vision; confusion; trouble speaking or understanding; severe headaches; sudden numbness or weakness of the face, arm or leg; trouble walking; dizziness; loss of balance or coordination) Side effects that usually do not require medical attention (report to yourdoctor or health care professional if they continue or are bothersome): constipation diarrhea dizziness gas headache nausea, vomiting This list may not describe all possible side effects. Call your doctor for medical advice about side effects. You may report side effects to FDA at1-800-FDA-1088. Where should I keep my medication? Keep out of the reach of children  and pets. Store at room temperature between 20 and 25 degrees C (68 and 77 degrees F).Protect from moisture. Keep the container tightly closed. Get rid of any unused medicine after the expiration date. To get rid of medicines that are no longer needed or have expired: Take the medicine to a medicine take-back program.  Check with your pharmacy or law enforcement to find a location. If you cannot return the medicine, check the label or package insert to see if the medicine should be thrown out in the garbage or flushed down the toilet. If you are not sure, ask your health care provider. If it is safe to put it in the trash, empty the medicine out of the container. Mix the medicine with cat litter, dirt, coffee grounds, or other unwanted substance. Seal the mixture in a bag or container. Put it in the trash. NOTE: This sheet is a summary. It may not cover all possible information. If you have questions about this medicine, talk to your doctor, pharmacist, orhealth care provider.  2022 Elsevier/Gold Standard (2020-04-25 15:14:36)

## 2020-12-26 NOTE — Progress Notes (Signed)
Subjective: 60 year old male presents the office today with interpreter for follow-up evaluation of left foot pain.  He states that he is doing better but still in some discomfort.  He did not take the meloxicam as he did not realize this was sent to the pharmacy.  He reports his last blood sugar was 154 today.  No recent injury or falls or changes otherwise since I last saw him.   Objective: AAO x3, NAD DP/PT pulses palpable bilaterally, CRT less than 3 seconds Today majority tenderness is localized to the fifth metatarsal base of the left foot.  No significant pain on the plantar fascia or the peroneal tendon today.  No area of pinpoint tenderness.  No edema, erythema.  MMT 5/5. No open lesions or pre-ulcerative lesions.  No pain with calf compression, swelling, warmth, erythema  Assessment: Fifth metatarsal base pain, insertional peroneal tendinitis  Plan: -All treatment options discussed with the patient including all alternatives, risks, complications.  -Steroid injection was performed to the fifth metatarsal base.  Care was taken not to directly infiltrated into the peroneal tendon.  Skin was cleaned with alcohol and 0.5 cc of dexamethasone phosphate, 0.5 cc of Marcaine plain was infiltrated the fifth metatarsal base without complication.  Postinjection care discussed.  Tolerated procedure well. -Discussed supportive shoe gear.  Anti-inflammatories as needed. -Patient encouraged to call the office with any questions, concerns, change in symptoms.   Trula Slade DPM

## 2020-12-29 ENCOUNTER — Telehealth: Payer: Self-pay | Admitting: Podiatry

## 2020-12-29 ENCOUNTER — Other Ambulatory Visit: Payer: Self-pay | Admitting: Podiatry

## 2020-12-29 MED ORDER — MELOXICAM 15 MG PO TABS: 15.0000 mg | ORAL_TABLET | Freq: Every day | ORAL | 0 refills | Status: AC

## 2020-12-29 NOTE — Telephone Encounter (Signed)
Patient stated that he was suppose to receive Meloxicam at his pharmacy last week.   Please Advise

## 2021-02-02 ENCOUNTER — Ambulatory Visit: Payer: Managed Care, Other (non HMO) | Admitting: Podiatry

## 2022-05-16 ENCOUNTER — Other Ambulatory Visit (HOSPITAL_BASED_OUTPATIENT_CLINIC_OR_DEPARTMENT_OTHER): Payer: Self-pay | Admitting: Physician Assistant

## 2022-05-16 DIAGNOSIS — M25462 Effusion, left knee: Secondary | ICD-10-CM

## 2022-05-22 ENCOUNTER — Ambulatory Visit (HOSPITAL_BASED_OUTPATIENT_CLINIC_OR_DEPARTMENT_OTHER)
Admission: RE | Admit: 2022-05-22 | Discharge: 2022-05-22 | Disposition: A | Discharge status: Home / Self Care | Payer: BC Managed Care – PPO | Source: Ambulatory Visit | Attending: Physician Assistant | Admitting: Physician Assistant

## 2022-05-22 DIAGNOSIS — M25462 Effusion, left knee: Secondary | ICD-10-CM | POA: Diagnosis present

## 2022-06-01 ENCOUNTER — Ambulatory Visit (INDEPENDENT_AMBULATORY_CARE_PROVIDER_SITE_OTHER): Payer: BC Managed Care – PPO | Admitting: Orthopaedic Surgery

## 2022-06-01 DIAGNOSIS — M7042 Prepatellar bursitis, left knee: Secondary | ICD-10-CM | POA: Diagnosis not present

## 2022-06-01 MED ORDER — LIDOCAINE HCL 1 % IJ SOLN
2.0000 mL | INTRAMUSCULAR | Status: AC | PRN
  Administered 2022-06-01: 2 mL

## 2022-06-01 MED ORDER — METHYLPREDNISOLONE ACETATE 40 MG/ML IJ SUSP
40.0000 mg | INTRAMUSCULAR | Status: AC | PRN
  Administered 2022-06-01: 40 mg via INTRA_ARTICULAR

## 2022-06-01 MED ORDER — BUPIVACAINE HCL 0.5 % IJ SOLN
2.0000 mL | INTRAMUSCULAR | Status: AC | PRN
  Administered 2022-06-01: 2 mL via INTRA_ARTICULAR

## 2022-06-01 NOTE — Progress Notes (Signed)
Office Visit Note   Patient: Alexander Parker           Date of Birth: 1960-08-15           MRN: [id number] Visit Date: 06/01/2022              Requested by: Jacelyn Pi, Lilia Argue, MD [place],  [place] [address] PCP: Jacelyn Pi, Lilia Argue, MD   Assessment & Plan: Visit Diagnoses:  1. Prepatellar bursitis of left knee     Plan: Impression left knee prepatellar bursitis.  At this point, this is not painful but he has noticed a gradual increase in size.  We recommended aspiration and cortisone injection followed by compression wrap.  I was able to aspirate approximately 15 cc of blood-tinged fluid from the left knee prepatellar bursa.  I then injected this with cortisone and applied a compression wrap.  He tolerated this well.  We have also discussed that it would best if he could avoid any pressure to the area for the next few weeks and then to wear kneepads when he returns to work kneeling.  Will follow-up with [place] as needed.   Follow-Up Instructions: Return if symptoms worsen or fail to improve.   Orders:  Orders Placed This Encounter  Procedures   Large Joint Inj   No orders of the defined types were placed in this encounter.     Procedures: Large Joint Inj: L knee on 06/01/2022 9:06 PM Details: 22 G needle Medications: 2 mL bupivacaine 0.5 %; 2 mL lidocaine 1 %; 40 mg methylPREDNISolone acetate 40 MG/ML Outcome: tolerated well, no immediate complications Patient was prepped and draped in the usual sterile fashion.       Clinical Data: No additional findings.   Subjective: Chief Complaint  Patient presents with   Left Knee - Pain    HPI patient is a pleasant 62 year old Spanish-speaking gentleman who comes in today for evaluation of left knee swelling.  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  He noticed swelling to the anterior left knee about 2 years ago which has increased in size.  He denies any pain, fevers  or chills or any other systemic symptoms.  Review of Systems as detailed in HPI.  All others reviewed and are negative.   Objective: Vital Signs: There were no vitals taken for this visit.  Physical Exam well-developed well-nourished gentleman in no acute distress.  Alert and oriented x 3.  Ortho Exam examination of the left knee reveals moderate swelling to the prepatellar bursa with callus formation.  No tenderness.  No joint line tenderness.  Range of motion 0 to 120 degrees.  He is neurovascular intact distally.  Specialty Comments:  No specialty comments available.  Imaging: No new imaging   PMFS History: Patient Active Problem List   Diagnosis Date Noted   Uncontrolled type 2 diabetes mellitus with hyperglycemia ([place]) 07/04/2018   Pure hypercholesterolemia 06/23/2018   HTN (hypertension) 12/23/2011   Diabetes mellitus ([place]) 07/29/2011   Past Medical History:  Diagnosis Date   Diabetes mellitus without complication ([place])    Hyperlipidemia    Hypertension     Family History  Problem Relation Age of Onset   Diabetes Father    Esophageal cancer Maternal Uncle    Colon cancer Neg Hx    Colon polyps Neg Hx    Rectal cancer Neg Hx    Stomach cancer Neg Hx  Past Surgical History:  Procedure Laterality Date   CIRCUMCISION     Social History   Occupational History   Not on file  Tobacco Use   Smoking status: Never   Smokeless tobacco: Never  Vaping Use   Vaping Use: Never used  Substance and Sexual Activity   Alcohol use: No    Alcohol/week: 0.0 standard drinks of alcohol   Drug use: No   Sexual activity: Yes

## 2023-02-11 ENCOUNTER — Emergency Department (HOSPITAL_COMMUNITY): Payer: BC Managed Care – PPO

## 2023-02-11 ENCOUNTER — Emergency Department (HOSPITAL_COMMUNITY)
Admission: EM | Admit: 2023-02-11 | Discharge: 2023-02-12 | Disposition: A | Discharge status: Home / Self Care | Payer: BC Managed Care – PPO | Attending: Emergency Medicine | Admitting: Emergency Medicine

## 2023-02-11 ENCOUNTER — Encounter (HOSPITAL_COMMUNITY): Payer: Self-pay

## 2023-02-11 ENCOUNTER — Other Ambulatory Visit: Payer: Self-pay

## 2023-02-11 DIAGNOSIS — S0990XA Unspecified injury of head, initial encounter: Secondary | ICD-10-CM | POA: Diagnosis present

## 2023-02-11 DIAGNOSIS — Z7984 Long term (current) use of oral hypoglycemic drugs: Secondary | ICD-10-CM | POA: Diagnosis not present

## 2023-02-11 DIAGNOSIS — Z23 Encounter for immunization: Secondary | ICD-10-CM | POA: Insufficient documentation

## 2023-02-11 DIAGNOSIS — D72829 Elevated white blood cell count, unspecified: Secondary | ICD-10-CM | POA: Insufficient documentation

## 2023-02-11 DIAGNOSIS — I10 Essential (primary) hypertension: Secondary | ICD-10-CM | POA: Insufficient documentation

## 2023-02-11 DIAGNOSIS — S06300A Unspecified focal traumatic brain injury without loss of consciousness, initial encounter: Secondary | ICD-10-CM | POA: Insufficient documentation

## 2023-02-11 DIAGNOSIS — E119 Type 2 diabetes mellitus without complications: Secondary | ICD-10-CM | POA: Insufficient documentation

## 2023-02-11 DIAGNOSIS — W11XXXA Fall on and from ladder, initial encounter: Secondary | ICD-10-CM | POA: Diagnosis not present

## 2023-02-11 DIAGNOSIS — W19XXXA Unspecified fall, initial encounter: Secondary | ICD-10-CM

## 2023-02-11 DIAGNOSIS — I629 Nontraumatic intracranial hemorrhage, unspecified: Secondary | ICD-10-CM

## 2023-02-11 LAB — CBC WITH DIFFERENTIAL/PLATELET
Abs Immature Granulocytes: 0.04 10*3/uL (ref 0.00–0.07)
Basophils Absolute: 0 10*3/uL (ref 0.0–0.1)
Basophils Relative: 0 %
Eosinophils Absolute: 0.1 10*3/uL (ref 0.0–0.5)
Eosinophils Relative: 1 %
HCT: 46.8 % (ref 39.0–52.0)
Hemoglobin: 15.5 g/dL (ref 13.0–17.0)
Immature Granulocytes: 0 %
Lymphocytes Relative: 6 %
Lymphs Abs: 0.8 10*3/uL (ref 0.7–4.0)
MCH: 30.1 pg (ref 26.0–34.0)
MCHC: 33.1 g/dL (ref 30.0–36.0)
MCV: 90.9 fL (ref 80.0–100.0)
Monocytes Absolute: 0.7 10*3/uL (ref 0.1–1.0)
Monocytes Relative: 5 %
Neutro Abs: 12.5 10*3/uL — ABNORMAL HIGH (ref 1.7–7.7)
Neutrophils Relative %: 88 %
Platelets: 204 10*3/uL (ref 150–400)
RBC: 5.15 MIL/uL (ref 4.22–5.81)
RDW: 12.9 % (ref 11.5–15.5)
WBC: 14.1 10*3/uL — ABNORMAL HIGH (ref 4.0–10.5)
nRBC: 0 % (ref 0.0–0.2)

## 2023-02-11 LAB — BASIC METABOLIC PANEL
Anion gap: 9 (ref 5–15)
BUN: 17 mg/dL (ref 8–23)
CO2: 26 mmol/L (ref 22–32)
Calcium: 9.2 mg/dL (ref 8.9–10.3)
Chloride: 100 mmol/L (ref 98–111)
Creatinine, Ser: 0.86 mg/dL (ref 0.61–1.24)
GFR, Estimated: >60 mL/min (ref >60)
Glucose, Bld: 98 mg/dL (ref 70–99)
Potassium: 4 mmol/L (ref 3.5–5.1)
Sodium: 135 mmol/L (ref 135–145)

## 2023-02-11 MED ORDER — CEFAZOLIN SODIUM-DEXTROSE 2-4 GM/100ML-% IV SOLN
2.0000 g | Freq: Once | INTRAVENOUS | Status: AC
  Administered 2023-02-11: 2 g via INTRAVENOUS
  Filled 2023-02-11: qty 100

## 2023-02-11 MED ORDER — ACETAMINOPHEN 500 MG PO TABS
1000.0000 mg | ORAL_TABLET | Freq: Once | ORAL | Status: AC
  Administered 2023-02-11: 1000 mg via ORAL
  Filled 2023-02-11: qty 2

## 2023-02-11 MED ORDER — TETANUS-DIPHTH-ACELL PERTUSSIS 5-2.5-18.5 LF-MCG/0.5 IM SUSY
0.5000 mL | PREFILLED_SYRINGE | Freq: Once | INTRAMUSCULAR | Status: AC
  Administered 2023-02-11: 0.5 mL via INTRAMUSCULAR
  Filled 2023-02-11: qty 0.5

## 2023-02-11 MED ORDER — LACTATED RINGERS IV BOLUS
1000.0000 mL | Freq: Once | INTRAVENOUS | Status: AC
  Administered 2023-02-11: 1000 mL via INTRAVENOUS

## 2023-02-11 NOTE — ED Triage Notes (Addendum)
Pt to ED BIB EMS from work with complaint of fall from ladder approx 45ft. Landed on head on concrete. +LOC for approx 30 seconds. C/o nausea, vomiting, and blurred vision to left eye upon arousal.   Denies blurred vision and nausea during triage.   Hematoma to posterior head. Arrives in c-collar.

## 2023-02-11 NOTE — ED Provider Notes (Signed)
[facility] AT [facility] Provider Note   CSN: [id number] Arrival date & time: 02/11/23  1746     History {Add pertinent medical, surgical, social history, OB history to HPI:1} Chief Complaint  Patient presents with   Alexander Parker is a 62 y.o. male.   Fall       Home Medications Prior to Admission medications   Medication Sig Start Date End Date Taking? Authorizing Provider  ACCU-CHEK GUIDE test strip USE UP TO 3 TIMES DAILY AS DIRECTED 07/06/20   Peyton Najjar, MD  Accu-Chek Softclix Lancets lancets USE UP TO 3 TIMES DAILY AS DIRECTED 07/06/20   Peyton Najjar, MD  atorvastatin (LIPITOR) 40 MG tablet TAKE ONE TABLET BY MOUTH ONCE DAILY FOR CHOLESTEROL 05/26/20   Just, Azalee Course, FNP  blood glucose meter kit and supplies KIT Per insurance preference. Use up to three times daily as directed. Dx E11.65, Z79.8. 05/26/20   Just, Azalee Course, FNP  diclofenac (VOLTAREN) 50 MG EC tablet TAKE 1 TABLET BY MOUTH TWICE DAILY FOR PAIN AND FOR INFLAMMATION 08/19/20   Janeece Agee, NP  diclofenac Sodium (VOLTAREN) 1 % GEL diclofenac 1 % topical gel  APPLY 2 GRAMS TO THE AFFECTED AREA(S) BY TOPICAL ROUTE 4 TIMES PER DAY    [provider]  glipiZIDE (GLUCOTROL) 10 MG tablet TAKE 1 TABLET BY MOUTH TWICE DAILY BEFORE A MEAL 01/14/20   Lezlie Lye, Meda Coffee, MD  insulin glargine (LANTUS) 100 UNIT/ML injection Inject into the skin.    [provider]  Insulin Pen Needle (PEN NEEDLES) 32G X 6 MM MISC 1 each by Does not apply route daily. Patient not taking: No sig reported 06/23/18   Lezlie Lye, Meda Coffee, MD  lisinopril (ZESTRIL) 20 MG tablet Take 1 tablet by mouth once daily 07/06/20   Peyton Najjar, MD  lisinopril (ZESTRIL) 20 MG tablet Take by mouth. 06/30/19   [provider]  metFORMIN (GLUCOPHAGE) 1000 MG tablet TAKE 1 TABLET BY MOUTH TWICE DAILY WITH A MEAL 03/01/20   Lezlie Lye, Meda Coffee, MD  methocarbamol (ROBAXIN) 500 MG  tablet Take 1 pill 3 times daily as needed for muscle relaxant for back pain. 03/17/20   Peyton Najjar, MD  pioglitazone (ACTOS) 15 MG tablet Take 1 tablet (15 mg total) by mouth daily. 01/14/20   Lezlie Lye, Meda Coffee, MD  tamsulosin (FLOMAX) 0.4 MG CAPS capsule Take 1 capsule (0.4 mg total) by mouth daily. 08/17/20   Janeece Agee, NP      Allergies    Patient has no known allergies.    Review of Systems   Review of Systems  Physical Exam Updated Vital Signs BP 134/74 (BP Location: Right Arm)   Pulse 82   Temp 98.4 F (36.9 C) (Oral)   Resp 16   SpO2 100%  Physical Exam  ED Results / Procedures / Treatments   Labs (all labs ordered are listed, but only abnormal results are displayed) Labs Reviewed - No data to display  EKG None  Radiology No results found.  Procedures Procedures  {Document cardiac monitor, telemetry assessment procedure when appropriate:1}  Medications Ordered in ED Medications - No data to display  ED Course/ Medical Decision Making/ A&P   {   Click here for ABCD2, HEART and other calculatorsREFRESH Note before signing :1}  Medical Decision Making  ***  {Document critical care time when appropriate:1} {Document review of labs and clinical decision tools ie heart score, Chads2Vasc2 etc:1}  {Document your independent review of radiology images, and any outside records:1} {Document your discussion with family members, caretakers, and with consultants:1} {Document social determinants of health affecting pt's care:1} {Document your decision making why or why not admission, treatments were needed:1} Final Clinical Impression(s) / ED Diagnoses Final diagnoses:  None    Rx / DC Orders ED Discharge Orders     None

## 2023-02-11 NOTE — ED Notes (Signed)
ED TO INPATIENT HANDOFF REPORT  ED Nurse Name and Phone #:  Les Pou RN  [phone]  S Name/Age/Gender Alexander Parker 62 y.o. male Room/Bed: TRABC/TRABC  Code Status   Code Status: Not on file  Home/SNF/Other Home Patient oriented to: self, place, time, and situation Is this baseline? Yes   Triage Complete: Triage complete  Chief Complaint Fall from Ladder  Triage Note Pt to ED BIB EMS from work with complaint of fall from ladder approx 16ft. Landed on head on concrete. +LOC for approx 30 seconds. C/o nausea, vomiting, and blurred vision to left eye upon arousal.   Denies blurred vision and nausea during triage.   Hematoma to posterior head. Arrives in c-collar.    Allergies No Known Allergies  Level of Care/Admitting Diagnosis ED Disposition     ED Disposition  Admit   Condition  --   Comment  The patient appears reasonably stabilized for admission considering the current resources, flow, and capabilities available in the ED at this time, and I doubt any other [facility] requiring further screening and/or treatment in the ED prior to admission is  present.          B Medical/Surgery History Past Medical History:  Diagnosis Date   Diabetes mellitus without complication (HCC)    Hyperlipidemia    Hypertension    Past Surgical History:  Procedure Laterality Date   CIRCUMCISION       A IV Location/Drains/Wounds Patient Lines/Drains/Airways Status     Active Line/Drains/Airways     Name Placement date Placement time Site Days   Peripheral IV 02/11/23 18 G Right Antecubital 02/11/23  1853  Antecubital  less than 1            Intake/Output Last 24 hours  Intake/Output Summary (Last 24 hours) at 02/11/2023 2149 Last data filed at 02/11/2023 2013 Gross per 24 hour  Intake 1000 ml  Output --  Net 1000 ml    Labs/Imaging Results for orders placed or performed during the hospital encounter of 02/11/23 (from the past 48 hour(s))  CBC with Differential      Status: Abnormal   Collection Time: 02/11/23  7:36 PM  Result Value Ref Range   WBC 14.1 (H) 4.0 - 10.5 K/uL   RBC 5.15 4.22 - 5.81 MIL/uL   Hemoglobin 15.5 13.0 - 17.0 g/dL   HCT 34.7 42.5 - 95.6 %   MCV 90.9 80.0 - 100.0 fL   MCH 30.1 26.0 - 34.0 pg   MCHC 33.1 30.0 - 36.0 g/dL   RDW 38.7 56.4 - 33.2 %   Platelets 204 150 - 400 K/uL   nRBC 0.0 0.0 - 0.2 %   Neutrophils Relative % 88 %   Neutro Abs 12.5 (H) 1.7 - 7.7 K/uL   Lymphocytes Relative 6 %   Lymphs Abs 0.8 0.7 - 4.0 K/uL   Monocytes Relative 5 %   Monocytes Absolute 0.7 0.1 - 1.0 K/uL   Eosinophils Relative 1 %   Eosinophils Absolute 0.1 0.0 - 0.5 K/uL   Basophils Relative 0 %   Basophils Absolute 0.0 0.0 - 0.1 K/uL   Immature Granulocytes 0 %   Abs Immature Granulocytes 0.04 0.00 - 0.07 K/uL    Comment: Performed at [facility], [address], [place], [place] [address]  Basic metabolic panel     Status: None   Collection Time: 02/11/23  7:36 PM  Result Value Ref Range   Sodium 135 135 - 145 mmol/L  Potassium 4.0 3.5 - 5.1 mmol/L   Chloride 100 98 - 111 mmol/L   CO2 26 22 - 32 mmol/L   Glucose, Bld 98 70 - 99 mg/dL    Comment: Glucose reference range applies only to samples taken after fasting for at least 8 hours.   BUN 17 8 - 23 mg/dL   Creatinine, Ser 1.61 0.61 - 1.24 mg/dL   Calcium 9.2 8.9 - 09.6 mg/dL   GFR, Estimated >04 >54 mL/min    Comment: (NOTE) Calculated using the CKD-EPI Creatinine Equation (2021)    Anion gap 9 5 - 15    Comment: Performed at [facility], [address] [place], [place] [address]   CT Head Wo Contrast  Result Date: 02/11/2023 CLINICAL DATA:  Head trauma, moderate-severe; Neck trauma, dangerous injury mechanism (Age 68-64y). Fall. EXAM: CT HEAD WITHOUT CONTRAST CT CERVICAL SPINE WITHOUT CONTRAST TECHNIQUE: Multidetector CT imaging of the head and cervical spine was performed following the standard protocol without intravenous contrast. Multiplanar CT image  reconstructions of the cervical spine were also generated. RADIATION DOSE REDUCTION: This exam was performed according to the departmental dose-optimization program which includes automated exposure control, adjustment of the mA and/or kV according to patient size and/or use of iterative reconstruction technique. COMPARISON:  None Available. FINDINGS: CT HEAD FINDINGS Brain: Scattered subarachnoid hemorrhage along the right sylvian fissure, bilateral subfrontal sulci, and anterior falx. Probable trace interhemispheric subdural hemorrhage along the falx near the vertex (axial image 26 series 2). Gray-white differentiation is preserved. No hydrocephalus, mass effect or midline shift. Vascular: No hyperdense vessel or unexpected calcification. Skull: Linear, nondisplaced fracture of the left occipital bone. Sinuses/Orbits: No acute findings. Other: None. CT CERVICAL SPINE FINDINGS Alignment: Normal. Skull base and vertebrae: No acute cervical spine fracture. Craniocervical junction is intact. Soft tissues and spinal canal: No prevertebral fluid or swelling. No visible canal hematoma. Disc levels: Multilevel cervical spondylosis, worst at C4-5, where there is at least mild spinal canal stenosis. Upper chest: No acute findings. Other: None. IMPRESSION: 1. Scattered subarachnoid hemorrhage along the right Sylvian fissure, bilateral subfrontal sulci, and anterior falx. 2. Probable trace interhemispheric subdural hemorrhage along the falx near the vertex. 3. Linear, nondisplaced fracture of the left occipital bone. 4. No acute cervical spine fracture or traumatic listhesis. Critical Value/emergent results were called by telephone at the time of interpretation on 02/11/2023 at 8:36 pm to provider Dr. Harle Battiest, who verbally acknowledged these results. Electronically Signed   By: Orvan Falconer M.D.   On: 02/11/2023 20:37   CT Cervical Spine Wo Contrast  Result Date: 02/11/2023 CLINICAL DATA:  Head trauma,  moderate-severe; Neck trauma, dangerous injury mechanism (Age 67-64y). Fall. EXAM: CT HEAD WITHOUT CONTRAST CT CERVICAL SPINE WITHOUT CONTRAST TECHNIQUE: Multidetector CT imaging of the head and cervical spine was performed following the standard protocol without intravenous contrast. Multiplanar CT image reconstructions of the cervical spine were also generated. RADIATION DOSE REDUCTION: This exam was performed according to the departmental dose-optimization program which includes automated exposure control, adjustment of the mA and/or kV according to patient size and/or use of iterative reconstruction technique. COMPARISON:  None Available. FINDINGS: CT HEAD FINDINGS Brain: Scattered subarachnoid hemorrhage along the right sylvian fissure, bilateral subfrontal sulci, and anterior falx. Probable trace interhemispheric subdural hemorrhage along the falx near the vertex (axial image 26 series 2). Gray-white differentiation is preserved. No hydrocephalus, mass effect or midline shift. Vascular: No hyperdense vessel or unexpected calcification. Skull: Linear, nondisplaced fracture of the left occipital  bone. Sinuses/Orbits: No acute findings. Other: None. CT CERVICAL SPINE FINDINGS Alignment: Normal. Skull base and vertebrae: No acute cervical spine fracture. Craniocervical junction is intact. Soft tissues and spinal canal: No prevertebral fluid or swelling. No visible canal hematoma. Disc levels: Multilevel cervical spondylosis, worst at C4-5, where there is at least mild spinal canal stenosis. Upper chest: No acute findings. Other: None. IMPRESSION: 1. Scattered subarachnoid hemorrhage along the right Sylvian fissure, bilateral subfrontal sulci, and anterior falx. 2. Probable trace interhemispheric subdural hemorrhage along the falx near the vertex. 3. Linear, nondisplaced fracture of the left occipital bone. 4. No acute cervical spine fracture or traumatic listhesis. Critical Value/emergent results were called by  telephone at the time of interpretation on 02/11/2023 at 8:36 pm to provider Dr. Harle Battiest, who verbally acknowledged these results. Electronically Signed   By: Orvan Falconer M.D.   On: 02/11/2023 20:37    Pending Labs Unresulted Labs (From admission, onward)    None       Vitals/Pain Today's Vitals   02/11/23 1930 02/11/23 1945 02/11/23 2145 02/11/23 2148  BP: 125/80 129/73 122/74   Pulse: 79 79 83   Resp: 13 12 13    Temp:      TempSrc:      SpO2: 100% 100% 99%   PainSc:    0-No pain    Isolation Precautions No active isolations  Medications Medications  acetaminophen (TYLENOL) tablet 1,000 mg (1,000 mg Oral Given 02/11/23 1931)  Tdap (BOOSTRIX) injection 0.5 mL (0.5 mLs Intramuscular Given 02/11/23 1931)  lactated ringers bolus 1,000 mL (0 mLs Intravenous Stopped 02/11/23 2013)    Mobility walks     Focused Assessments     R Recommendations: See Admitting Provider Note  Report given to:   Additional Notes:

## 2023-02-12 ENCOUNTER — Emergency Department (HOSPITAL_COMMUNITY): Payer: BC Managed Care – PPO

## 2023-02-12 LAB — CBG MONITORING, ED: Glucose-Capillary: 98 mg/dL (ref 70–99)

## 2023-02-12 MED ORDER — IOHEXOL 350 MG/ML SOLN
75.0000 mL | Freq: Once | INTRAVENOUS | Status: AC | PRN
  Administered 2023-02-12: 75 mL via INTRAVENOUS

## 2023-02-12 MED ORDER — ACETAMINOPHEN 500 MG PO TABS
1000.0000 mg | ORAL_TABLET | Freq: Once | ORAL | Status: AC
  Administered 2023-02-12: 1000 mg via ORAL
  Filled 2023-02-12: qty 2

## 2023-02-12 MED ORDER — IBUPROFEN 800 MG PO TABS
800.0000 mg | ORAL_TABLET | Freq: Once | ORAL | Status: AC
  Administered 2023-02-12: 800 mg via ORAL
  Filled 2023-02-12: qty 1

## 2023-02-12 NOTE — ED Notes (Signed)
Patient transported to CT scan .

## 2023-02-12 NOTE — Consult Note (Signed)
Reason for Consult: Traumatic subarachnoid hemorrhage Referring Physician: Dr. Jetty Duhamel Renz is an 62 y.o. male.  HPI: The patient is a 63 year old Hispanic male who by report fell off a ladder striking the back of his head.  He was brought to [facility] emergency department.  A head scan was obtained which demonstrated a traumatic subarachnoid hemorrhage.  We were contacted and recommended 6 hours of observation and repeat CAT scan.  The repeat CAT scan demonstrated no significant change from the first scan.  A formal neurosurgical consultation was requested.  Presently the patient is accompanied by his wife.  Neither speak [place].  Past Medical History:  Diagnosis Date   Diabetes mellitus without complication (HCC)    Hyperlipidemia    Hypertension     Past Surgical History:  Procedure Laterality Date   CIRCUMCISION      Family History  Problem Relation Age of Onset   Diabetes Father    Esophageal cancer Maternal Uncle    Colon cancer Neg Hx    Colon polyps Neg Hx    Rectal cancer Neg Hx    Stomach cancer Neg Hx     Social History:  reports that he has never smoked. He has never used smokeless tobacco. He reports that he does not drink alcohol and does not use drugs.  Allergies: No Known Allergies  Medications: I have reviewed the patient's current medications. Prior to Admission: (Not in a hospital admission)  Scheduled: Continuous: PRN: Anti-infectives (From admission, onward)    Start     Dose/Rate Route Frequency Ordered Stop   02/11/23 2230  ceFAZolin (ANCEF) IVPB 2g/100 mL premix        2 g 200 mL/hr over 30 Minutes Intravenous  Once 02/11/23 2229 02/11/23 2258        Results for orders placed or performed during the hospital encounter of 02/11/23 (from the past 48 hour(s))  CBC with Differential     Status: Abnormal   Collection Time: 02/11/23  7:36 PM  Result Value Ref Range   WBC 14.1 (H) 4.0 - 10.5 K/uL   RBC 5.15 4.22 - 5.81 MIL/uL    Hemoglobin 15.5 13.0 - 17.0 g/dL   HCT 63.8 75.6 - 43.3 %   MCV 90.9 80.0 - 100.0 fL   MCH 30.1 26.0 - 34.0 pg   MCHC 33.1 30.0 - 36.0 g/dL   RDW 29.5 18.8 - 41.6 %   Platelets 204 150 - 400 K/uL   nRBC 0.0 0.0 - 0.2 %   Neutrophils Relative % 88 %   Neutro Abs 12.5 (H) 1.7 - 7.7 K/uL   Lymphocytes Relative 6 %   Lymphs Abs 0.8 0.7 - 4.0 K/uL   Monocytes Relative 5 %   Monocytes Absolute 0.7 0.1 - 1.0 K/uL   Eosinophils Relative 1 %   Eosinophils Absolute 0.1 0.0 - 0.5 K/uL   Basophils Relative 0 %   Basophils Absolute 0.0 0.0 - 0.1 K/uL   Immature Granulocytes 0 %   Abs Immature Granulocytes 0.04 0.00 - 0.07 K/uL    Comment: Performed at [facility], [address], [place], [place] [address]  Basic metabolic panel     Status: None   Collection Time: 02/11/23  7:36 PM  Result Value Ref Range   Sodium 135 135 - 145 mmol/L   Potassium 4.0 3.5 - 5.1 mmol/L   Chloride 100 98 - 111 mmol/L   CO2 26 22 - 32 mmol/L   Glucose, Bld  98 70 - 99 mg/dL    Comment: Glucose reference range applies only to samples taken after fasting for at least 8 hours.   BUN 17 8 - 23 mg/dL   Creatinine, Ser 1.61 0.61 - 1.24 mg/dL   Calcium 9.2 8.9 - 09.6 mg/dL   GFR, Estimated >04 >54 mL/min    Comment: (NOTE) Calculated using the CKD-EPI Creatinine Equation (2021)    Anion gap 9 5 - 15    Comment: Performed at [facility], [address], [place], [place] [address]  CBG monitoring, ED     Status: None   Collection Time: 02/12/23  2:01 AM  Result Value Ref Range   Glucose-Capillary 98 70 - 99 mg/dL    Comment: Glucose reference range applies only to samples taken after fasting for at least 8 hours.    CT CHEST ABDOMEN PELVIS W CONTRAST  Result Date: 02/12/2023 CLINICAL DATA:  Moderate to severe polytrauma.  Fall. EXAM: CT CHEST, ABDOMEN, AND PELVIS WITH CONTRAST TECHNIQUE: Multidetector CT imaging of the chest, abdomen and pelvis was performed following the standard protocol during  bolus administration of intravenous contrast. RADIATION DOSE REDUCTION: This exam was performed according to the departmental dose-optimization program which includes automated exposure control, adjustment of the mA and/or kV according to patient size and/or use of iterative reconstruction technique. CONTRAST:  75mL OMNIPAQUE IOHEXOL 350 MG/ML SOLN COMPARISON:  Chest radiograph 02/11/2023 FINDINGS: CT CHEST FINDINGS Cardiovascular: No pericardial effusion. No evidence of aortic injury. Coronary artery atherosclerotic calcification. Mediastinum/Nodes: Trachea and esophagus are unremarkable. No mediastinal hematoma. Lungs/Pleura: No focal consolidation, pleural effusion, or pneumothorax. Musculoskeletal: No acute fracture. CT ABDOMEN PELVIS FINDINGS Hepatobiliary: No hepatic laceration or hematoma. Unremarkable gallbladder and biliary tree. Pancreas: Unremarkable. Spleen: No splenic laceration or hematoma. Adrenals/Urinary Tract: No adrenal hemorrhage. No renal laceration or hematoma. Unremarkable bladder. Stomach/Bowel: Normal caliber large and small bowel. No bowel wall thickening. Stomach is within normal limits. Normal appendix. Vascular/Lymphatic: No evidence of acute vascular injury. No lymphadenopathy. Reproductive: Enlarged prostate. Other: No free intraperitoneal fluid or air. Musculoskeletal: No acute fracture. IMPRESSION: No evidence of acute traumatic injury in the chest, abdomen, or pelvis. Electronically Signed   By: Minerva Fester M.D.   On: 02/12/2023 03:08   CT Head Wo Contrast  Result Date: 02/12/2023 CLINICAL DATA:  Head trauma EXAM: CT HEAD WITHOUT CONTRAST TECHNIQUE: Contiguous axial images were obtained from the base of the skull through the vertex without intravenous contrast. RADIATION DOSE REDUCTION: This exam was performed according to the departmental dose-optimization program which includes automated exposure control, adjustment of the mA and/or kV according to patient size and/or use  of iterative reconstruction technique. COMPARISON:  02/11/2023 8:19 p.m. FINDINGS: Brain: Redemonstrated subarachnoid hemorrhage along the right sylvian fissure, in the right frontal sulci along the anterior falx, and along the inferior frontal lobes bilaterally. Small amount of subarachnoid hemorrhage in the right parietal sulci (series 4, image 22 in series 6, image 28) is new from prior exam. No significant mass effect or midline shift. No hydrocephalus or mass. No evidence of acute infarct. Vascular: No hyperdense vessel. Skull: Redemonstrated nondisplaced fracture of the left occipital bone. Sinuses/Orbits: No acute finding. Other: None. IMPRESSION: Redemonstrated subarachnoid hemorrhage along the right Sylvian fissure, right frontal sulci along the anterior falx, and along the inferior frontal lobes bilaterally. Small amount of subarachnoid hemorrhage in the right parietal sulci is new from prior exam. No significant mass effect or midline shift. Electronically Signed   By: Jill Side  Vasan M.D.   On: 02/12/2023 03:01   DG Chest 1 View  Result Date: 02/11/2023 CLINICAL DATA:  Larey Seat EXAM: CHEST  1 VIEW COMPARISON:  None Available. FINDINGS: Single frontal view of the chest demonstrates an unremarkable cardiac silhouette. No acute airspace disease, effusion, or pneumothorax. No acute displaced fracture. IMPRESSION: 1. No acute intrathoracic process. Electronically Signed   By: Sharlet Salina M.D.   On: 02/11/2023 22:09   CT Head Wo Contrast  Result Date: 02/11/2023 CLINICAL DATA:  Head trauma, moderate-severe; Neck trauma, dangerous injury mechanism (Age 1-64y). Fall. EXAM: CT HEAD WITHOUT CONTRAST CT CERVICAL SPINE WITHOUT CONTRAST TECHNIQUE: Multidetector CT imaging of the head and cervical spine was performed following the standard protocol without intravenous contrast. Multiplanar CT image reconstructions of the cervical spine were also generated. RADIATION DOSE REDUCTION: This exam was performed  according to the departmental dose-optimization program which includes automated exposure control, adjustment of the mA and/or kV according to patient size and/or use of iterative reconstruction technique. COMPARISON:  None Available. FINDINGS: CT HEAD FINDINGS Brain: Scattered subarachnoid hemorrhage along the right sylvian fissure, bilateral subfrontal sulci, and anterior falx. Probable trace interhemispheric subdural hemorrhage along the falx near the vertex (axial image 26 series 2). Gray-white differentiation is preserved. No hydrocephalus, mass effect or midline shift. Vascular: No hyperdense vessel or unexpected calcification. Skull: Linear, nondisplaced fracture of the left occipital bone. Sinuses/Orbits: No acute findings. Other: None. CT CERVICAL SPINE FINDINGS Alignment: Normal. Skull base and vertebrae: No acute cervical spine fracture. Craniocervical junction is intact. Soft tissues and spinal canal: No prevertebral fluid or swelling. No visible canal hematoma. Disc levels: Multilevel cervical spondylosis, worst at C4-5, where there is at least mild spinal canal stenosis. Upper chest: No acute findings. Other: None. IMPRESSION: 1. Scattered subarachnoid hemorrhage along the right Sylvian fissure, bilateral subfrontal sulci, and anterior falx. 2. Probable trace interhemispheric subdural hemorrhage along the falx near the vertex. 3. Linear, nondisplaced fracture of the left occipital bone. 4. No acute cervical spine fracture or traumatic listhesis. Critical Value/emergent results were called by telephone at the time of interpretation on 02/11/2023 at 8:36 pm to provider Dr. Harle Battiest, who verbally acknowledged these results. Electronically Signed   By: Orvan Falconer M.D.   On: 02/11/2023 20:37   CT Cervical Spine Wo Contrast  Result Date: 02/11/2023 CLINICAL DATA:  Head trauma, moderate-severe; Neck trauma, dangerous injury mechanism (Age 35-64y). Fall. EXAM: CT HEAD WITHOUT CONTRAST CT CERVICAL  SPINE WITHOUT CONTRAST TECHNIQUE: Multidetector CT imaging of the head and cervical spine was performed following the standard protocol without intravenous contrast. Multiplanar CT image reconstructions of the cervical spine were also generated. RADIATION DOSE REDUCTION: This exam was performed according to the departmental dose-optimization program which includes automated exposure control, adjustment of the mA and/or kV according to patient size and/or use of iterative reconstruction technique. COMPARISON:  None Available. FINDINGS: CT HEAD FINDINGS Brain: Scattered subarachnoid hemorrhage along the right sylvian fissure, bilateral subfrontal sulci, and anterior falx. Probable trace interhemispheric subdural hemorrhage along the falx near the vertex (axial image 26 series 2). Gray-white differentiation is preserved. No hydrocephalus, mass effect or midline shift. Vascular: No hyperdense vessel or unexpected calcification. Skull: Linear, nondisplaced fracture of the left occipital bone. Sinuses/Orbits: No acute findings. Other: None. CT CERVICAL SPINE FINDINGS Alignment: Normal. Skull base and vertebrae: No acute cervical spine fracture. Craniocervical junction is intact. Soft tissues and spinal canal: No prevertebral fluid or swelling. No visible canal hematoma. Disc levels: Multilevel cervical spondylosis, worst at  C4-5, where there is at least mild spinal canal stenosis. Upper chest: No acute findings. Other: None. IMPRESSION: 1. Scattered subarachnoid hemorrhage along the right Sylvian fissure, bilateral subfrontal sulci, and anterior falx. 2. Probable trace interhemispheric subdural hemorrhage along the falx near the vertex. 3. Linear, nondisplaced fracture of the left occipital bone. 4. No acute cervical spine fracture or traumatic listhesis. Critical Value/emergent results were called by telephone at the time of interpretation on 02/11/2023 at 8:36 pm to provider Dr. Harle Battiest, who verbally acknowledged  these results. Electronically Signed   By: Orvan Falconer M.D.   On: 02/11/2023 20:37    ROS Blood pressure 121/76, pulse 98, temperature 97.7 F (36.5 C), temperature source Temporal, resp. rate 18, SpO2 97%. Estimated body mass index is 27.28 kg/m as calculated from the following:   Height as of 08/17/20: 5\' 3"  (1.6 m).   Weight as of 08/17/20: 69.9 kg.  Physical Exam  General: A pleasant 62 year old Hispanic male in no apparent distress  HEENT: Extraocular muscles intact  Neck: No obvious abnormalities in a hard collar.  Thorax symmetric  Abdomen soft  Extremities: Unremarkable  Neurologic exam: The patient is alert and pleasant.  We have a language barrier.  Cranial nerves II through XII are grossly intact bilaterally.  The patient strength is normal and his vital bicep, handgrip, gastrocnemius, dorsiflexors.  Cerebellar function is intact to rapid alternating movements of the upper extremities bilaterally.  Sensory function appears intact.  I have reviewed the patient's head CT performed yesterday [facility]  He has a right traumatic subarachnoid hemorrhage.  There is minimal change between the scans.  He has a left occipital bone fracture.  I have reviewed the patient's cervical CT.  He has degenerative changes.  I do not see any acute changes.  Assessment/Plan: Traumatic subarachnoid hemorrhage, skull fracture: The patient scans are stable.  He is okay for discharge to home from my point of view.  Cervicalgia: He admits to some neck pain.  I would suggest flexion-extension C-spine x-rays to clear his C-spine.  Cristi Loron 02/12/2023, 7:54 AM

## 2023-02-12 NOTE — TOC CAGE-AID Note (Signed)
Transition of Care [facility]) - CAGE-AID Screening   Patient Details  Name: Alexander Parker MRN: [id number] Date of Birth: [birth year]  [facility]) CM/SW Contact:    Janora Norlander, RN Phone Number: [phone] 02/12/2023, 8:07 AM   Clinical Narrative: Pt here after falling off of a ladder and sustaining a traumatic SAH.  Pt denies drug or alcohol use.  Screening complete.   CAGE-AID Screening:    Have You Ever Felt You Ought to Cut Down on Your Drinking or Drug Use?: No Have People Annoyed You By Critizing Your Drinking Or Drug Use?: No Have You Felt Bad Or Guilty About Your Drinking Or Drug Use?: No Have You Ever Had a Drink or Used Drugs First Thing In The Morning to Steady Your Nerves or to Get Rid of a Hangover?: No CAGE-AID Score: 0  Substance Abuse Education Offered: No

## 2023-02-12 NOTE — ED Provider Notes (Signed)
Patient signed out to me at shift change.  Larey Seat off a 6 foot ladder backwards and hit his head.  Sustained occipital skull fracture, subarachnoid and subdural.  Neurosurg was consulted and recommends 6 hour CT.  Patient re-examined by myself and Dr. Nicanor Alcon.  No blood in EACs, there is some dried blood on left posterior neck that appears to be from occipital scalp abrasion, but no sign of laceration.  Given MOI and severe and distracting injuries, will add CT c/a/p to rule out any other occult injuries.  Repeat CT is notable for new SAH in parietal sulci.  Patient seen by and discussed with Dr. Nicanor Alcon, who recommends formal consultation from neuro surg.  .Critical Care  Performed by: Roxy Horseman, PA-C Authorized by: Roxy Horseman, PA-C   Critical care provider statement:    Critical care time (minutes):  31   Critical care was necessary to treat or prevent imminent or life-threatening deterioration of the following conditions: subarachnoid hemorrhage.   Critical care was time spent personally by me on the following activities:  Development of treatment plan with patient or surrogate, discussions with consultants, evaluation of patient's response to treatment, examination of patient, ordering and review of laboratory studies, ordering and review of radiographic studies, ordering and performing treatments and interventions, pulse oximetry, re-evaluation of patient's condition and review of old charts    I discussed the case with Doran Durand, APP from neurosurgery.  Plan to consult in ED in the AM.  Patient will be signed out to morning team pending disposition from neurosurgery.   Roxy Horseman, PA-C 02/12/23 0400    Palumbo, April, MD 02/12/23 [phone]

## 2023-02-12 NOTE — Discharge Instructions (Addendum)
You were seen today following a fall from a ladder.  Your head CT did show a small amount of bleeding in your brain as well as a fracture of your skull.  We spoke with our neurosurgery team and obtained a repeat scan which was stable.  Your tetanus was updated today.  You should not return to activity until cleared by your primary care doctor.  You can continue to take Tylenol for pain.  Please avoid aspirin or any other blood thinning medications.  Please return to the emergency department if you are having new or worsening pain, change in mental status, vision change, nausea, vomiting.  Fue visto hoy despus de una cada de Neurosurgeon.  La tomografa computarizada de su cabeza mostr una pequea cantidad de sangrado en su cerebro, as como una fractura de crneo.  Hablamos con nuestro equipo de neurociruga y obtuvimos una repeticin de la exploracin que result estable.  Su ttanos fue actualizado hoy.  No debe volver a realizar [employer] su mdico de atencin primaria lo autorice.  Puede seguir tomando Tylenol para Chief Technology Officer.  Evite la aspirina o cualquier otro medicamento anticoagulante.  Regrese al departamento de emergencias si tiene dolor nuevo o que [place], cambios en el estado mental, cambios en la visin, nuseas o vmitos.

## 2023-02-12 NOTE — ED Provider Notes (Signed)
Accepted handoff at shift change from [employer], [place]. Please see prior provider note for more detail.   Briefly: Patient is 62 y.o. who presents after a fall from a ladder with most recent CT revealing skull fracture, subarachnoid and subdural hemorrhage.   Plan: Consult neurosurgery.  Disposition depend on their recommendations.    Physical Exam  BP 120/88   Pulse 85   Temp 98 F (36.7 C) (Oral)   Resp 19   SpO2 98%   Physical Exam  Procedures  Procedures  ED Course / MDM   Clinical Course as of 02/12/23 1453  Tue Feb 12, 2023  8295 Head bleed (fall off of latter). Occiptal skull fracture, subdural and subarrachnoid. Mild increase on subarrachnoid. Awaiting on Neurosurgery. Dispo per there recs.  [JR]    Clinical Course User Index [JR] Gareth Eagle, PA-C   Medical Decision Making Amount and/or Complexity of Data Reviewed Labs: ordered. Radiology: ordered.  Risk OTC drugs. Prescription drug management.    On reassessment, patient stated that his head hurt somewhat.  Treated with Tylenol ibuprofen.  Reassessed and stated he felt much better.  Was evaluated by Dr. Lovell Sheehan of neurosurgery.  After evaluation, stated that he was okay to be discharged with neurosurgery follow-up.  Did recommend flexion-extension C-spine x-rays to clear C-spine.  C-spine x-rays were unremarkable.  Cleared C-spine.  Advised him to follow-up with neurosurgery.  Discussed strict return precautions.  Vitals stable.  Discharged home in good condition.      Gareth Eagle, PA-C 02/12/23 1502    Lonell Grandchild, MD 02/13/23 [phone]

## 2023-02-15 ENCOUNTER — Other Ambulatory Visit: Payer: Self-pay

## 2023-02-15 ENCOUNTER — Inpatient Hospital Stay (HOSPITAL_COMMUNITY)
Admission: EM | Admit: 2023-02-15 | Discharge: 2023-02-17 | DRG: 084 | Disposition: A | Discharge status: Home / Self Care | Payer: Worker's Compensation | Attending: Student in an Organized Health Care Education/Training Program | Admitting: Student in an Organized Health Care Education/Training Program

## 2023-02-15 ENCOUNTER — Emergency Department (HOSPITAL_COMMUNITY): Payer: BC Managed Care – PPO

## 2023-02-15 DIAGNOSIS — Z7984 Long term (current) use of oral hypoglycemic drugs: Secondary | ICD-10-CM

## 2023-02-15 DIAGNOSIS — E1165 Type 2 diabetes mellitus with hyperglycemia: Secondary | ICD-10-CM | POA: Diagnosis present

## 2023-02-15 DIAGNOSIS — Z79899 Other long term (current) drug therapy: Secondary | ICD-10-CM

## 2023-02-15 DIAGNOSIS — S065XAA Traumatic subdural hemorrhage with loss of consciousness status unknown, initial encounter: Principal | ICD-10-CM | POA: Diagnosis present

## 2023-02-15 DIAGNOSIS — E11649 Type 2 diabetes mellitus with hypoglycemia without coma: Secondary | ICD-10-CM | POA: Diagnosis not present

## 2023-02-15 DIAGNOSIS — Z833 Family history of diabetes mellitus: Secondary | ICD-10-CM

## 2023-02-15 DIAGNOSIS — I10 Essential (primary) hypertension: Secondary | ICD-10-CM | POA: Diagnosis present

## 2023-02-15 DIAGNOSIS — S069XAA Unspecified intracranial injury with loss of consciousness status unknown, initial encounter: Secondary | ICD-10-CM | POA: Diagnosis present

## 2023-02-15 DIAGNOSIS — I609 Nontraumatic subarachnoid hemorrhage, unspecified: Secondary | ICD-10-CM

## 2023-02-15 DIAGNOSIS — W11XXXA Fall on and from ladder, initial encounter: Secondary | ICD-10-CM | POA: Diagnosis present

## 2023-02-15 DIAGNOSIS — S02119A Unspecified fracture of occiput, initial encounter for closed fracture: Secondary | ICD-10-CM | POA: Diagnosis present

## 2023-02-15 DIAGNOSIS — G44209 Tension-type headache, unspecified, not intractable: Secondary | ICD-10-CM | POA: Diagnosis present

## 2023-02-15 DIAGNOSIS — E119 Type 2 diabetes mellitus without complications: Secondary | ICD-10-CM

## 2023-02-15 DIAGNOSIS — E7800 Pure hypercholesterolemia, unspecified: Secondary | ICD-10-CM | POA: Diagnosis present

## 2023-02-15 DIAGNOSIS — Z23 Encounter for immunization: Secondary | ICD-10-CM

## 2023-02-15 DIAGNOSIS — R519 Headache, unspecified: Secondary | ICD-10-CM | POA: Diagnosis not present

## 2023-02-15 LAB — BASIC METABOLIC PANEL
Anion gap: 9 (ref 5–15)
BUN: 14 mg/dL (ref 8–23)
CO2: 25 mmol/L (ref 22–32)
Calcium: 9 mg/dL (ref 8.9–10.3)
Chloride: 101 mmol/L (ref 98–111)
Creatinine, Ser: 0.56 mg/dL — ABNORMAL LOW (ref 0.61–1.24)
GFR, Estimated: >60 mL/min (ref >60)
Glucose, Bld: 153 mg/dL — ABNORMAL HIGH (ref 70–99)
Potassium: 4 mmol/L (ref 3.5–5.1)
Sodium: 135 mmol/L (ref 135–145)

## 2023-02-15 LAB — HEMOGLOBIN A1C
Hgb A1c MFr Bld: 6.8 % — ABNORMAL HIGH (ref 4.8–5.6)
Mean Plasma Glucose: 148.46 mg/dL

## 2023-02-15 LAB — CBC
HCT: 45.7 % (ref 39.0–52.0)
Hemoglobin: 15.3 g/dL (ref 13.0–17.0)
MCH: 29.4 pg (ref 26.0–34.0)
MCHC: 33.5 g/dL (ref 30.0–36.0)
MCV: 87.7 fL (ref 80.0–100.0)
Platelets: 213 10*3/uL (ref 150–400)
RBC: 5.21 MIL/uL (ref 4.22–5.81)
RDW: 13 % (ref 11.5–15.5)
WBC: 7.4 10*3/uL (ref 4.0–10.5)
nRBC: 0 % (ref 0.0–0.2)

## 2023-02-15 LAB — CBG MONITORING, ED: Glucose-Capillary: 157 mg/dL — ABNORMAL HIGH (ref 70–99)

## 2023-02-15 MED ORDER — ONDANSETRON HCL 4 MG/2ML IJ SOLN: 4.0000 mg | Freq: Four times a day (QID) | INTRAMUSCULAR | Status: DC | PRN

## 2023-02-15 MED ORDER — LISINOPRIL 20 MG PO TABS
20.0000 mg | ORAL_TABLET | Freq: Every day | ORAL | Status: DC
  Administered 2023-02-16 – 2023-02-17 (×2): 20 mg via ORAL
  Filled 2023-02-15 (×2): qty 1

## 2023-02-15 MED ORDER — ACETAMINOPHEN 500 MG PO TABS
1000.0000 mg | ORAL_TABLET | Freq: Four times a day (QID) | ORAL | Status: DC
  Administered 2023-02-15 – 2023-02-16 (×3): 1000 mg via ORAL
  Filled 2023-02-15 (×3): qty 2

## 2023-02-15 MED ORDER — ONDANSETRON HCL 4 MG/2ML IJ SOLN
4.0000 mg | Freq: Once | INTRAMUSCULAR | Status: AC
  Administered 2023-02-15: 4 mg via INTRAVENOUS
  Filled 2023-02-15: qty 2

## 2023-02-15 MED ORDER — SODIUM CHLORIDE 0.9 % IV BOLUS
1000.0000 mL | Freq: Once | INTRAVENOUS | Status: AC
  Administered 2023-02-15: 1000 mL via INTRAVENOUS

## 2023-02-15 MED ORDER — INSULIN ASPART 100 UNIT/ML IJ SOLN
0.0000 [IU] | Freq: Three times a day (TID) | INTRAMUSCULAR | Status: DC
  Administered 2023-02-17: 1 [IU] via SUBCUTANEOUS

## 2023-02-15 MED ORDER — FENTANYL CITRATE PF 50 MCG/ML IJ SOSY
50.0000 ug | PREFILLED_SYRINGE | Freq: Once | INTRAMUSCULAR | Status: AC
  Administered 2023-02-15: 50 ug via INTRAVENOUS
  Filled 2023-02-15: qty 1

## 2023-02-15 MED ORDER — ATORVASTATIN CALCIUM 40 MG PO TABS
40.0000 mg | ORAL_TABLET | Freq: Every day | ORAL | Status: DC
  Administered 2023-02-16 – 2023-02-17 (×2): 40 mg via ORAL
  Filled 2023-02-15 (×2): qty 1

## 2023-02-15 MED ORDER — ONDANSETRON 4 MG PO TBDP
4.0000 mg | ORAL_TABLET | Freq: Four times a day (QID) | ORAL | Status: DC | PRN
  Administered 2023-02-16 – 2023-02-17 (×2): 4 mg via ORAL
  Filled 2023-02-15 (×2): qty 1

## 2023-02-15 MED ORDER — EMPAGLIFLOZIN 25 MG PO TABS
25.0000 mg | ORAL_TABLET | Freq: Every day | ORAL | Status: DC
  Administered 2023-02-16 – 2023-02-17 (×2): 25 mg via ORAL
  Filled 2023-02-15 (×2): qty 1

## 2023-02-15 MED ORDER — INSULIN ASPART 100 UNIT/ML IJ SOLN: 0.0000 [IU] | Freq: Three times a day (TID) | INTRAMUSCULAR | Status: DC

## 2023-02-15 MED ORDER — GLIPIZIDE 10 MG PO TABS
10.0000 mg | ORAL_TABLET | Freq: Two times a day (BID) | ORAL | Status: DC
  Administered 2023-02-16: 10 mg via ORAL
  Filled 2023-02-15 (×2): qty 1

## 2023-02-15 MED ORDER — ONDANSETRON HCL 4 MG PO TABS: 4.0000 mg | ORAL_TABLET | Freq: Four times a day (QID) | ORAL | Status: DC | PRN

## 2023-02-15 MED ORDER — ACETAMINOPHEN 650 MG RE SUPP: 650.0000 mg | Freq: Four times a day (QID) | RECTAL | Status: DC

## 2023-02-15 NOTE — ED Notes (Signed)
ED TO INPATIENT HANDOFF REPORT  ED Nurse Name and Phone #: Juliette Alcide [phone]  S Name/Age/Gender Alexander Parker 62 y.o. male Room/Bed: 040C/040C  Code Status   Code Status: Full Code  Home/SNF/Other Home Patient oriented to: self, place, time, and situation Is this baseline? Yes   Triage Complete: Triage complete  Chief Complaint Subdural hematoma caused by concussion (HCC) [S06.5XAA]  Triage Note Sister stated, He fell down on the 16th and they said he had a skull fx and sent him home to follow up with a neurologist, They called the neurologist Lovell Sheehan and would not give him an appt. Since he left the hospital he has been dizzy, and N/V .    Allergies No Known Allergies  Level of Care/Admitting Diagnosis ED Disposition     ED Disposition  Admit   Condition  --   Comment  Hospital Area: [facility] [[id number]]  Level of Care: Telemetry Medical [104]  May place patient in observation at [facility] or [facility] if equivalent level of care is available:: No  Covid Evaluation: Asymptomatic - no recent exposure (last 10 days) testing not required  Diagnosis: Subdural hematoma caused by concussion [facility]) [[id number]]  Admitting Physician: Tyson Alias [[id number]]  Attending Physician: Tyson Alias [phone]          B Medical/Surgery History Past Medical History:  Diagnosis Date   Diabetes mellitus without complication (HCC)    Hyperlipidemia    Hypertension    Past Surgical History:  Procedure Laterality Date   CIRCUMCISION       A IV Location/Drains/Wounds Patient Lines/Drains/Airways Status     Active Line/Drains/Airways     Name Placement date Placement time Site Days   Peripheral IV 02/15/23 20 G Anterior;Distal;Right;Upper Arm 02/15/23  1538  Arm  less than 1            Intake/Output Last 24 hours  Intake/Output Summary (Last 24 hours) at 02/15/2023 2237 Last data filed at 02/15/2023 1910 Gross per 24 hour  Intake  1000 ml  Output --  Net 1000 ml    Labs/Imaging Results for orders placed or performed during the hospital encounter of 02/15/23 (from the past 48 hour(s))  Basic metabolic panel     Status: Abnormal   Collection Time: 02/15/23 10:15 AM  Result Value Ref Range   Sodium 135 135 - 145 mmol/L   Potassium 4.0 3.5 - 5.1 mmol/L   Chloride 101 98 - 111 mmol/L   CO2 25 22 - 32 mmol/L   Glucose, Bld 153 (H) 70 - 99 mg/dL    Comment: Glucose reference range applies only to samples taken after fasting for at least 8 hours.   BUN 14 8 - 23 mg/dL   Creatinine, Ser 2.13 (L) 0.61 - 1.24 mg/dL   Calcium 9.0 8.9 - 08.6 mg/dL   GFR, Estimated >57 >84 mL/min    Comment: (NOTE) Calculated using the CKD-EPI Creatinine Equation (2021)    Anion gap 9 5 - 15    Comment: Performed at [facility], [address], [place], [place] [address]  CBC     Status: None   Collection Time: 02/15/23 10:15 AM  Result Value Ref Range   WBC 7.4 4.0 - 10.5 K/uL   RBC 5.21 4.22 - 5.81 MIL/uL   Hemoglobin 15.3 13.0 - 17.0 g/dL   HCT 52.8 41.3 - 24.4 %   MCV 87.7 80.0 - 100.0 fL   MCH 29.4 26.0 - 34.0  pg   MCHC 33.5 30.0 - 36.0 g/dL   RDW 64.4 03.4 - 74.2 %   Platelets 213 150 - 400 K/uL   nRBC 0.0 0.0 - 0.2 %    Comment: Performed at [facility], [address] [place], [place] [address]  CBG monitoring, ED     Status: Abnormal   Collection Time: 02/15/23 10:18 AM  Result Value Ref Range   Glucose-Capillary 157 (H) 70 - 99 mg/dL    Comment: Glucose reference range applies only to samples taken after fasting for at least 8 hours.   CT Head Wo Contrast  Result Date: 02/15/2023 CLINICAL DATA:  Headache, increasing frequency or severity EXAM: CT HEAD WITHOUT CONTRAST TECHNIQUE: Contiguous axial images were obtained from the base of the skull through the vertex without intravenous contrast. RADIATION DOSE REDUCTION: This exam was performed according to the departmental dose-optimization program which  includes automated exposure control, adjustment of the mA and/or kV according to patient size and/or use of iterative reconstruction technique. COMPARISON:  CT Head 02/12/23 FINDINGS: Brain: Interval resolution of the previously seen subarachnoid hemorrhage in the right sylvian fissure and along the right cerebral convexity. Small amount of residual subarachnoid blood products remains along the inferior medial left frontal lobe (series 5, image 53). Compared to prior exam there is a new 7 mm low-density subdural fluid collection along the left cerebral convexity. No hydrocephalus. No extra-axial fluid collection. No CT evidence of an cortical infarct. No midline shift. No mass effect Vascular: No hyperdense vessel or unexpected calcification. Skull: Normal. Negative for fracture or focal lesion. 6 mm peripherally calcified lesion in the left face, favored to represent epidermal inclusion cyst. Sinuses/Orbits: No middle ear or mastoid effusion. Paranasal sinuses are clear. Orbits are unremarkable. Other: None. IMPRESSION: 1. Compared to prior exam there is a new 7 mm low-density subdural fluid collection along the left cerebral convexity, which may represent a chronic subdural hematoma or hygroma. No midline shift. Recommend short term follow up head CT to ensure stability. 2. Interval resolution of the previously seen subarachnoid hemorrhage in the right Sylvian fissure and along the right cerebral convexity. Small amount of residual subarachnoid hemorrhage remains along the inferior medial left frontal lobe. Electronically Signed   By: Lorenza Cambridge M.D.   On: 02/15/2023 14:56    Pending Labs Unresulted Labs (From admission, onward)     Start     Ordered   02/15/23 1641  Hemoglobin A1c  Once,   R       Comments: To assess prior glycemic control    02/15/23 1643   02/15/23 1638  HIV Antibody (routine testing w rflx)  (HIV Antibody (Routine testing w reflex) panel)  Once,   R        02/15/23 1643    02/15/23 1014  Urinalysis, Routine w reflex microscopic -Urine, Random  Once,   URGENT       Question:  Specimen Source  Answer:  Urine, Random   02/15/23 1014   Pending  Basic metabolic panel  Once,   R        Pending   Pending  CBC  Once,   R        Pending   Pending  Urinalysis, Routine w reflex microscopic -Urine, Random  Once,   R       Question:  Specimen Source  Answer:  Urine, Random   Pending            Vitals/Pain Today's  Vitals   02/15/23 1715 02/15/23 1927 02/15/23 1928 02/15/23 1929  BP: 124/72     Pulse: 71     Resp: 13     Temp:   98.6 F (37 C)   SpO2: 100%     PainSc:  7   7     Isolation Precautions No active isolations  Medications Medications  acetaminophen (TYLENOL) tablet 1,000 mg (1,000 mg Oral Given 02/15/23 1926)  ondansetron (ZOFRAN-ODT) disintegrating tablet 4 mg (has no administration in time range)  atorvastatin (LIPITOR) tablet 40 mg (has no administration in time range)  glipiZIDE (GLUCOTROL) tablet 10 mg (has no administration in time range)  empagliflozin (JARDIANCE) tablet 25 mg (has no administration in time range)  lisinopril (ZESTRIL) tablet 20 mg (has no administration in time range)  insulin aspart (novoLOG) injection 0-9 Units (has no administration in time range)  fentaNYL (SUBLIMAZE) injection 50 mcg (50 mcg Intravenous Given 02/15/23 1548)  ondansetron (ZOFRAN) injection 4 mg (4 mg Intravenous Given 02/15/23 1543)  sodium chloride 0.9 % bolus 1,000 mL (0 mLs Intravenous Stopped 02/15/23 1910)    Mobility walks     Focused Assessments Neuro Assessment Handoff:  Swallow screen pass? Yes          Neuro Assessment:   Neuro Checks:      Has TPA been given? No If patient is a Neuro Trauma and patient is going to OR before floor call report to 4N Charge nurse: [phone] or [phone]   R Recommendations: See Admitting Provider Note  Report given to:   Additional Notes: .

## 2023-02-15 NOTE — ED Provider Notes (Signed)
[place] [facility] AT [facility] Provider Note   CSN: [id number] Arrival date & time: 02/15/23  0945     History  Chief Complaint  Patient presents with   Fall   Dizziness   Emesis   Nausea    Alexander Parker is a 62 y.o. male.  HPI 62 year old male presents with headache and vomiting.  History is taken from a family member who is a Nature conservation officer in the hospital and translates from [place] to [place].  The patient fell on 9/16 off a ladder and suffered a skull fracture and hemorrhage in his brain.  He vomited at the time of the incident and then vomited after he was discharged.  He vomited again this morning.  The headache has progressively worsened.  Is primarily occipital and goes to the front.  No new falls or injuries.  No new neck pain though his neck still bothers him.  No weakness or numbness in his extremities, speech changes.  No recent fevers.  He has tried Tylenol which partially helps.  Has called the neurosurgery office multiple times for outpatient follow-up but has not had any callbacks after leaving voicemails.    Home Medications Prior to Admission medications   Medication Sig Start Date End Date Taking? Authorizing Provider  ACCU-CHEK GUIDE test strip USE UP TO 3 TIMES DAILY AS DIRECTED 07/06/20   Peyton Najjar, MD  Accu-Chek Softclix Lancets lancets USE UP TO 3 TIMES DAILY AS DIRECTED 07/06/20   Peyton Najjar, MD  atorvastatin (LIPITOR) 40 MG tablet TAKE ONE TABLET BY MOUTH ONCE DAILY FOR CHOLESTEROL 05/26/20   Just, Azalee Course, FNP  blood glucose meter kit and supplies KIT Per insurance preference. Use up to three times daily as directed. Dx E11.65, Z79.8. 05/26/20   Just, Azalee Course, FNP  glipiZIDE (GLUCOTROL) 10 MG tablet TAKE 1 TABLET BY MOUTH TWICE DAILY BEFORE A MEAL 01/14/20   Lezlie Lye, Meda Coffee, MD  Insulin Pen Needle (PEN NEEDLES) 32G X 6 MM MISC 1 each by Does not apply route daily. Patient not taking: Reported on 03/17/2020  06/23/18   Lezlie Lye, Meda Coffee, MD  JARDIANCE 25 MG TABS tablet Take 25 mg by mouth daily.    [provider]  lisinopril (ZESTRIL) 20 MG tablet Take 1 tablet by mouth once daily 07/06/20   Peyton Najjar, MD  metFORMIN (GLUCOPHAGE) 1000 MG tablet TAKE 1 TABLET BY MOUTH TWICE DAILY WITH A MEAL Patient taking differently: Take 1,000 mg by mouth 2 (two) times daily with a meal. 03/01/20   Lezlie Lye, Meda Coffee, MD  tamsulosin (FLOMAX) 0.4 MG CAPS capsule Take 1 capsule (0.4 mg total) by mouth daily. Patient not taking: Reported on 02/11/2023 08/17/20   Janeece Agee, NP      Allergies    Patient has no known allergies.    Review of Systems   Review of Systems  Cardiovascular:  Negative for chest pain.  Gastrointestinal:  Positive for nausea and vomiting.  Musculoskeletal:  Positive for neck pain.  Neurological:  Positive for headaches.    Physical Exam Updated Vital Signs BP 134/71   Pulse 62   Temp (!) 96.2 F (35.7 C)   Resp 15   SpO2 100%  Physical Exam Vitals and nursing note reviewed.  Constitutional:      General: He is not in acute distress.    Appearance: He is well-developed. He is not ill-appearing or diaphoretic.  HENT:     Head:  Normocephalic and atraumatic.   Eyes:     Extraocular Movements: Extraocular movements intact.     Pupils: Pupils are equal, round, and reactive to light.  Cardiovascular:     Rate and Rhythm: Normal rate and regular rhythm.     Heart sounds: Normal heart sounds.  Pulmonary:     Effort: Pulmonary effort is normal.     Breath sounds: Normal breath sounds.  Abdominal:     Palpations: Abdomen is soft.     Tenderness: There is no abdominal tenderness.  Musculoskeletal:     Cervical back: No rigidity.  Skin:    General: Skin is warm and dry.  Neurological:     Mental Status: He is alert.     Comments: CN 3-12 grossly intact. 5/5 strength in all 4 extremities. Grossly normal sensation. Normal finger to nose.      ED  Results / Procedures / Treatments   Labs (all labs ordered are listed, but only abnormal results are displayed) Labs Reviewed  BASIC METABOLIC PANEL - Abnormal; Notable for the following components:      Result Value   Glucose, Bld 153 (*)    Creatinine, Ser 0.56 (*)    All other components within normal limits  CBG MONITORING, ED - Abnormal; Notable for the following components:   Glucose-Capillary 157 (*)    All other components within normal limits  CBC  URINALYSIS, ROUTINE W REFLEX MICROSCOPIC    EKG EKG Interpretation Date/Time:  Friday February 15 2023 09:58:40 EDT Ventricular Rate:  76 PR Interval:  164 QRS Duration:  96 QT Interval:  368 QTC Calculation: 414 R Axis:   84  Text Interpretation: Normal sinus rhythm Non-specific ST-t changes When compared with ECG of 11-Feb-2023 18:59, PREVIOUS ECG IS PRESENT No significant change since last tracing Confirmed by Margarita Grizzle [phone]) on 02/15/2023 10:34:23 AM  Radiology CT Head Wo Contrast  Result Date: 02/15/2023 CLINICAL DATA:  Headache, increasing frequency or severity EXAM: CT HEAD WITHOUT CONTRAST TECHNIQUE: Contiguous axial images were obtained from the base of the skull through the vertex without intravenous contrast. RADIATION DOSE REDUCTION: This exam was performed according to the departmental dose-optimization program which includes automated exposure control, adjustment of the mA and/or kV according to patient size and/or use of iterative reconstruction technique. COMPARISON:  CT Head 02/12/23 FINDINGS: Brain: Interval resolution of the previously seen subarachnoid hemorrhage in the right sylvian fissure and along the right cerebral convexity. Small amount of residual subarachnoid blood products remains along the inferior medial left frontal lobe (series 5, image 53). Compared to prior exam there is a new 7 mm low-density subdural fluid collection along the left cerebral convexity. No hydrocephalus. No extra-axial fluid  collection. No CT evidence of an cortical infarct. No midline shift. No mass effect Vascular: No hyperdense vessel or unexpected calcification. Skull: Normal. Negative for fracture or focal lesion. 6 mm peripherally calcified lesion in the left face, favored to represent epidermal inclusion cyst. Sinuses/Orbits: No middle ear or mastoid effusion. Paranasal sinuses are clear. Orbits are unremarkable. Other: None. IMPRESSION: 1. Compared to prior exam there is a new 7 mm low-density subdural fluid collection along the left cerebral convexity, which may represent a chronic subdural hematoma or hygroma. No midline shift. Recommend short term follow up head CT to ensure stability. 2. Interval resolution of the previously seen subarachnoid hemorrhage in the right Sylvian fissure and along the right cerebral convexity. Small amount of residual subarachnoid hemorrhage remains along the inferior medial left frontal  lobe. Electronically Signed   By: Lorenza Cambridge M.D.   On: 02/15/2023 14:56    Procedures Procedures    Medications Ordered in ED Medications  fentaNYL (SUBLIMAZE) injection 50 mcg (50 mcg Intravenous Given 02/15/23 1548)  ondansetron (ZOFRAN) injection 4 mg (4 mg Intravenous Given 02/15/23 1543)  sodium chloride 0.9 % bolus 1,000 mL (1,000 mLs Intravenous New Bag/Given 02/15/23 1548)    ED Course/ Medical Decision Making/ A&P                                 Medical Decision Making Amount and/or Complexity of Data Reviewed Labs: ordered.    Details: Mild hyperglycemia. Radiology: ordered and independent interpretation performed.    Details: Small subdural  Risk Prescription drug management. Decision regarding hospitalization.   Patient presents with worsening headache and an episode of vomiting.  No new trauma.  CT seems to show some mildly new changes.  Neurosurgery has been consulted but I have yet to hear back from them so care was transferred to Dr. Hyacinth Meeker.  He was given IV pain and  nausea control.        Final Clinical Impression(s) / ED Diagnoses Final diagnoses:  SDH (subdural hematoma) (HCC)  SAH (subarachnoid hemorrhage) (HCC)    Rx / DC Orders ED Discharge Orders     None         Pricilla Loveless, MD 02/15/23 1552

## 2023-02-15 NOTE — Progress Notes (Signed)
Full consult note to follow. 62 y/o male presented to the [facility] on 9/16 after a fall off of a ladder. He was diagnosed with a tSAH and a skull fracture, repeat CTH was stable. He was asymptomatic and neurologically intact so he was discharged home. He presented back to the ED today with worsening headache and nausea. CTH was reviewed showing increased size of chronic SDH to 7mm, no midline shift, improvement in tSAH compared to prior scan. It is unlikely that a SDH of this size would be causing his symptoms, therefore I recommend admission by medicine for further work up of other causes for his headache and nausea. Recommend a repeat CTH tomorrow AM to make sure the chronic SDH is stable, CTH order placed. Will see him tomorrow AM after repeat CTH.   Emilee Hero, PA-C

## 2023-02-15 NOTE — ED Triage Notes (Signed)
Sister stated, He fell down on the 16th and they said he had a skull fx and sent him home to follow up with a neurologist, They called the neurologist Lovell Sheehan and would not give him an appt. Since he left the hospital he has been dizzy, and N/V .

## 2023-02-15 NOTE — ED Provider Notes (Signed)
This patient is a 63 year old male, history of diabetes and hypertension who had a fall off of a ladder several days ago, had a subsequent small subarachnoid and subdural, initially consultation by neurosurgery recommended going home but the patient comes back with increasing headache and nausea, I have spoken with neurosurgery after the CT scan showed a slight increase in size of the subdural but they do not think it is big enough to drain.  They would like the patient to be admitted to medical service, they want a CT scan repeated in the morning to make sure nothing is getting worse and they will see the patient in consultation.  I have personally evaluated the patient was following commands without difficulty.  Has a clear heart and lung sounds, soft abdomen, he is awake and alert and complaining of headache and nausea.  I have placed an IV in his right arm with ultrasound guidance secondary to poor IV access.  Angiocath insertion Performed by: Vida Roller  Consent: Verbal consent obtained. Risks and benefits: risks, benefits and alternatives were discussed Time out: Immediately prior to procedure a "time out" was called to verify the correct patient, procedure, equipment, support staff and site/side marked as required.  Preparation: Patient was prepped and draped in the usual sterile fashion.  Vein Location: R AC  Yes Ultrasound Guided  Gauge: 20  Normal blood return and flush without difficulty Patient tolerance: Patient tolerated the procedure well with no immediate complications.  Discussed neurosurgery who will make recommendations as they see the patient in consultation, discussed with internal medicine teaching service who will admit the patient to the hospital.  I forwarded the recommendation having a repeat CT head in the morning  Final diagnoses:  SDH (subdural hematoma) (HCC)  SAH (subarachnoid hemorrhage) (HCC)      Eber Hong, MD 02/15/23 [phone]

## 2023-02-15 NOTE — ED Notes (Signed)
ED TO INPATIENT HANDOFF REPORT  ED Nurse Name and Phone #: Donny Pique, RN [phone]  S Name/Age/Gender Alexander Parker 62 y.o. male Room/Bed: 040C/040C  Code Status   Code Status: Full Code  Home/SNF/Other Home Patient oriented to: self, place, time, and situation Is this baseline? Yes   Triage Complete: Triage complete  Chief Complaint Subdural hematoma caused by concussion (HCC) [S06.5XAA]  Triage Note Sister stated, He fell down on the 16th and they said he had a skull fx and sent him home to follow up with a neurologist, They called the neurologist Lovell Sheehan and would not give him an appt. Since he left the hospital he has been dizzy, and N/V .    Allergies No Known Allergies  Level of Care/Admitting Diagnosis ED Disposition     ED Disposition  Admit   Condition  --   Comment  Hospital Area: [facility] [[id number]]  Level of Care: Telemetry Medical [104]  May place patient in observation at [facility] or [facility] if equivalent level of care is available:: No  Covid Evaluation: Asymptomatic - no recent exposure (last 10 days) testing not required  Diagnosis: Subdural hematoma caused by concussion [facility]) [[id number]]  Admitting Physician: Tyson Alias [[id number]]  Attending Physician: Tyson Alias [phone]          B Medical/Surgery History Past Medical History:  Diagnosis Date   Diabetes mellitus without complication (HCC)    Hyperlipidemia    Hypertension    Past Surgical History:  Procedure Laterality Date   CIRCUMCISION       A IV Location/Drains/Wounds Patient Lines/Drains/Airways Status     Active Line/Drains/Airways     Name Placement date Placement time Site Days   Peripheral IV 02/15/23 20 G Anterior;Distal;Right;Upper Arm 02/15/23  1538  Arm  less than 1            Intake/Output Last 24 hours No intake or output data in the 24 hours ending 02/15/23 1728  Labs/Imaging Results for orders placed or performed  during the hospital encounter of 02/15/23 (from the past 48 hour(s))  Basic metabolic panel     Status: Abnormal   Collection Time: 02/15/23 10:15 AM  Result Value Ref Range   Sodium 135 135 - 145 mmol/L   Potassium 4.0 3.5 - 5.1 mmol/L   Chloride 101 98 - 111 mmol/L   CO2 25 22 - 32 mmol/L   Glucose, Bld 153 (H) 70 - 99 mg/dL    Comment: Glucose reference range applies only to samples taken after fasting for at least 8 hours.   BUN 14 8 - 23 mg/dL   Creatinine, Ser 8.46 (L) 0.61 - 1.24 mg/dL   Calcium 9.0 8.9 - 96.2 mg/dL   GFR, Estimated >95 >28 mL/min    Comment: (NOTE) Calculated using the CKD-EPI Creatinine Equation (2021)    Anion gap 9 5 - 15    Comment: Performed at [facility], [address], [place], [place] [address]  CBC     Status: None   Collection Time: 02/15/23 10:15 AM  Result Value Ref Range   WBC 7.4 4.0 - 10.5 K/uL   RBC 5.21 4.22 - 5.81 MIL/uL   Hemoglobin 15.3 13.0 - 17.0 g/dL   HCT 40.1 02.7 - 25.3 %   MCV 87.7 80.0 - 100.0 fL   MCH 29.4 26.0 - 34.0 pg   MCHC 33.5 30.0 - 36.0 g/dL   RDW 66.4 40.3 - 47.4 %  Platelets 213 150 - 400 K/uL   nRBC 0.0 0.0 - 0.2 %    Comment: Performed at [facility], [address] [place], [place] [address]  CBG monitoring, ED     Status: Abnormal   Collection Time: 02/15/23 10:18 AM  Result Value Ref Range   Glucose-Capillary 157 (H) 70 - 99 mg/dL    Comment: Glucose reference range applies only to samples taken after fasting for at least 8 hours.   CT Head Wo Contrast  Result Date: 02/15/2023 CLINICAL DATA:  Headache, increasing frequency or severity EXAM: CT HEAD WITHOUT CONTRAST TECHNIQUE: Contiguous axial images were obtained from the base of the skull through the vertex without intravenous contrast. RADIATION DOSE REDUCTION: This exam was performed according to the departmental dose-optimization program which includes automated exposure control, adjustment of the mA and/or kV according to patient size  and/or use of iterative reconstruction technique. COMPARISON:  CT Head 02/12/23 FINDINGS: Brain: Interval resolution of the previously seen subarachnoid hemorrhage in the right sylvian fissure and along the right cerebral convexity. Small amount of residual subarachnoid blood products remains along the inferior medial left frontal lobe (series 5, image 53). Compared to prior exam there is a new 7 mm low-density subdural fluid collection along the left cerebral convexity. No hydrocephalus. No extra-axial fluid collection. No CT evidence of an cortical infarct. No midline shift. No mass effect Vascular: No hyperdense vessel or unexpected calcification. Skull: Normal. Negative for fracture or focal lesion. 6 mm peripherally calcified lesion in the left face, favored to represent epidermal inclusion cyst. Sinuses/Orbits: No middle ear or mastoid effusion. Paranasal sinuses are clear. Orbits are unremarkable. Other: None. IMPRESSION: 1. Compared to prior exam there is a new 7 mm low-density subdural fluid collection along the left cerebral convexity, which may represent a chronic subdural hematoma or hygroma. No midline shift. Recommend short term follow up head CT to ensure stability. 2. Interval resolution of the previously seen subarachnoid hemorrhage in the right Sylvian fissure and along the right cerebral convexity. Small amount of residual subarachnoid hemorrhage remains along the inferior medial left frontal lobe. Electronically Signed   By: Lorenza Cambridge M.D.   On: 02/15/2023 14:56    Pending Labs Unresulted Labs (From admission, onward)     Start     Ordered   02/15/23 1641  Hemoglobin A1c  Once,   R       Comments: To assess prior glycemic control    02/15/23 1643   02/15/23 1638  HIV Antibody (routine testing w rflx)  (HIV Antibody (Routine testing w reflex) panel)  Once,   R        02/15/23 1643   02/15/23 1014  Urinalysis, Routine w reflex microscopic -Urine, Random  Once,   URGENT        Question:  Specimen Source  Answer:  Urine, Random   02/15/23 1014   Pending  Basic metabolic panel  Once,   R        Pending   Pending  CBC  Once,   R        Pending   Pending  Urinalysis, Routine w reflex microscopic -Urine, Random  Once,   R       Question:  Specimen Source  Answer:  Urine, Random   Pending            Vitals/Pain Today's Vitals   02/15/23 1008 02/15/23 1215 02/15/23 1504 02/15/23 1550  BP:  134/71  108/63  Pulse:  62  67  Resp:  15  15  Temp:   (!) 96.2 F (35.7 C)   SpO2:  100%  100%  PainSc: 5        Isolation Precautions No active isolations  Medications Medications  acetaminophen (TYLENOL) tablet 1,000 mg (has no administration in time range)  ondansetron (ZOFRAN-ODT) disintegrating tablet 4 mg (has no administration in time range)  atorvastatin (LIPITOR) tablet 40 mg (has no administration in time range)  glipiZIDE (GLUCOTROL) tablet 10 mg (has no administration in time range)  empagliflozin (JARDIANCE) tablet 25 mg (has no administration in time range)  lisinopril (ZESTRIL) tablet 20 mg (has no administration in time range)  fentaNYL (SUBLIMAZE) injection 50 mcg (50 mcg Intravenous Given 02/15/23 1548)  ondansetron (ZOFRAN) injection 4 mg (4 mg Intravenous Given 02/15/23 1543)  sodium chloride 0.9 % bolus 1,000 mL (1,000 mLs Intravenous New Bag/Given 02/15/23 1548)    Mobility walks     Focused Assessments    R Recommendations: See Admitting Provider Note  Report given to:   Additional Notes: Patient is A&Ox4,  seen for scalp fracture two days ago and returned with worsening headache, given meds, IV placed. Patient has family at bedside and does not speak english.

## 2023-02-15 NOTE — H&P (Cosign Needed Addendum)
Date: 02/15/2023               Patient Name:  Alexander Parker MRN: [id number]  DOB: [birth year] Age / Sex: 62 y.o., male   PCP: Lezlie Lye, Meda Coffee, MD         Medical Service: Internal Medicine Teaching Service         Attending Physician: Dr. Oswaldo Done, Marquita Palms, *      First Contact: Dr. Carmina Miller, DO Pager [phone]    Second Contact: Dr. Marrianne Mood, MD Pager [phone]         After Hours (After 5p/  First Contact Pager: [phone]  weekends / holidays): Second Contact Pager: [phone]   SUBJECTIVE   Chief Complaint: Headache with n/v  History of Present Illness: Chayton Acero is a 62 year old male with PMH of traumatic SAH w/ LOC 9/16, T2DM, HTN, and HLD who presented to the ED 9/20 with complaints of headache with nausea and vomiting. Patient seen in the ED 9/16 after falling off 6 foot ladder and hitting his head. CT head 9/16 showed: 1. Scattered subarachnoid hemorrhage along the right Sylvian fissure, bilateral subfrontal sulci, and anterior falx. 2. Probable trace interhemispheric subdural hemorrhage along the falx near the vertex. 3. Linear, nondisplaced fracture of the left occipital bone. 4. No acute cervical spine fracture or traumatic listhesis.  Neurosurgery consulted during previous ED visit who recommended 6 hours of observation and repeat CT head which demonstrated no significant change from previous scan. Patient was treated with Tylenol and Ibuprofen with improvement in symptoms and was cleared to discharge home.  Patient is Hispanic - H&P were obtained through the use of an interpreter. Today patient endorses continued headaches with intermittent nausea and two episodes of vomiting (last night and this morning). Patient states he reached out to Neurosurgery over the last few days regarding a follow-up appointment but was told to follow-up with his PCP. Headache is described as left-sided, mild, constant, with no exacerbating factors (including positional  changes) and somewhat relieved with Tylenol/Ibuprofen. There has been no increase in severity since 9/16. Patient had n/v during initial injury and this has continued since ED discharge with two vomiting episodes last night and this morning. Patient has been able to tolerate po intake. Denies LOC since 9/16, visual changes, confusion, weakness, falls, or abdominal pain. When describing circumstances around 9/16 fall, patient states he was climbing up the ladder and it fell backwards when he was nearing the top. Denies prodromal symptoms prior to fall and states LOC occurred after fall. Patient further denies alcohol/tobacco use.     ED Course: CT head showed: 1. Compared to prior exam there is a new 7 mm low-density subdural fluid collection along the left cerebral convexity, which may represent a chronic subdural hematoma or hygroma. No midline shift. Recommend short term follow up head CT to ensure stability. 2. Interval resolution of the previously seen subarachnoid hemorrhage in the right Sylvian fissure and along the right cerebral convexity. Small amount of residual subarachnoid hemorrhage remains along the inferior medial left frontal lobe.  Neurosurgery consulted and stated that bleed is not large enough to drain; they recommended repeat CT head in the morning to assess for any interval worsening.  Labs and ECG unremarkable    Meds:  Current Outpatient Medications  Medication Instructions   ACCU-CHEK GUIDE test strip USE UP TO 3 TIMES DAILY AS DIRECTED   Accu-Chek Softclix Lancets lancets USE UP TO 3 TIMES DAILY AS DIRECTED   atorvastatin (LIPITOR)  40 MG tablet TAKE ONE TABLET BY MOUTH ONCE DAILY FOR CHOLESTEROL   blood glucose meter kit and supplies KIT Per insurance preference. Use up to three times daily as directed. Dx E11.65, Z79.8.   glipiZIDE (GLUCOTROL) 10 MG tablet TAKE 1 TABLET BY MOUTH TWICE DAILY BEFORE A MEAL   Insulin Pen Needle (PEN NEEDLES) 32G X 6 MM MISC 1 each,  Does not apply, Daily   Jardiance 25 mg, Oral, Daily   lisinopril (ZESTRIL) 20 MG tablet Take 1 tablet by mouth once daily   metFORMIN (GLUCOPHAGE) 1000 MG tablet TAKE 1 TABLET BY MOUTH TWICE DAILY WITH A MEAL   tamsulosin (FLOMAX) 0.4 mg, Oral, Daily     Past Medical History Past Medical History:  Diagnosis Date   Diabetes mellitus without complication (HCC)    Hyperlipidemia    Hypertension      Past Surgical History:  Procedure Laterality Date   CIRCUMCISION      Social:  Lives With:Wife and home Occupation: Education administrator Level of Function: PCP: Lezlie Lye, Meda Coffee, MD  Family History: Family History  Problem Relation Age of Onset   Diabetes Father    Esophageal cancer Maternal Uncle    Colon cancer Neg Hx    Colon polyps Neg Hx    Rectal cancer Neg Hx    Stomach cancer Neg Hx     Allergies: Allergies as of 02/15/2023   (No Known Allergies)    Review of Systems: A complete ROS was negative except as per HPI.   OBJECTIVE:   Physical Exam: Blood pressure 108/63, pulse 67, temperature (!) 96.2 F (35.7 C), resp. rate 15, SpO2 100%.   Constitutional: well-appearing, lying in bed, in no acute distress HENT: normocephalic atraumatic Cardiovascular: regular rate and rhythm, no m/r/g Pulmonary/Chest: normal work of breathing on room air, lungs clear to auscultation bilaterally Abdominal: soft, non-tender, non-distended MSK: normal bulk and tone Neurological: alert & oriented x 3, no focal deficit Skin: warm and dry Psych: normal mood and behavior  Labs: CBC    Component Value Date/Time   WBC 7.4 02/15/2023 1015   RBC 5.21 02/15/2023 1015   HGB 15.3 02/15/2023 1015   HGB 15.6 09/18/2019 1651   HCT 45.7 02/15/2023 1015   HCT 45.3 09/18/2019 1651   PLT 213 02/15/2023 1015   PLT 258 09/18/2019 1651   MCV 87.7 02/15/2023 1015   MCV 88 09/18/2019 1651   MCH 29.4 02/15/2023 1015   MCHC 33.5 02/15/2023 1015   RDW 13.0 02/15/2023 1015   RDW 12.7 09/18/2019  1651   LYMPHSABS 0.8 02/11/2023 1936   MONOABS 0.7 02/11/2023 1936   EOSABS 0.1 02/11/2023 1936   BASOSABS 0.0 02/11/2023 1936     CMP     Component Value Date/Time   NA 135 02/15/2023 1015   NA 137 08/17/2020 1414   K 4.0 02/15/2023 1015   CL 101 02/15/2023 1015   CO2 25 02/15/2023 1015   GLUCOSE 153 (H) 02/15/2023 1015   BUN 14 02/15/2023 1015   BUN 13 08/17/2020 1414   CREATININE 0.56 (L) 02/15/2023 1015   CREATININE 0.65 (L) 04/09/2016 1337   CALCIUM 9.0 02/15/2023 1015   PROT 7.0 08/17/2020 1414   ALBUMIN 4.2 08/17/2020 1414   AST 9 08/17/2020 1414   ALT 18 08/17/2020 1414   ALKPHOS 81 08/17/2020 1414   BILITOT 0.3 08/17/2020 1414   GFRNONAA >60 02/15/2023 1015   GFRNONAA >89 03/27/2015 1225   GFRAA 127 12/18/2019 9604  GFRAA >89 03/27/2015 1225     ASSESSMENT & PLAN:   Assessment & Plan by Problem: Principal Problem:   Subdural hematoma caused by concussion (HCC) Active Problems:   HTN (hypertension)   Pure hypercholesterolemia   Uncontrolled type 2 diabetes mellitus with hyperglycemia (HCC)   Keyjuan Tiano is a 62 y.o.male with PMH of traumatic SAH w/ LOC 9/16, T2DM, HTN, and HLD who presented to the ED 9/20 with complains of headache with nausea and vomiting.  #Subdural Hematoma Patient with new 7 mm subdural hematoma along left cerebral convexity; no midline shift. Imaging also noted interval improvement of previous SAH. Patient with continued headache with n/v that improves with Tylenol/Ibuprofen. Patient states he was feeling much better during time of interview after receiving 650 mg Tylenol in ED. Per Neurology, SDH too small to drain and is opting to monitor with imaging tomorrow.  -CT head w/o contrast in AM -Neurosurgery to consult in AM -Neuro checks q 4 -Tylenol 1,000 mg q 6 -Monitor telemetry -Zofran 4 mg q 6 PRN  #HTN Normotensive -Continue home Lisinopril  #HLD LDL in 2022 was 70. -Continue home Lipitor  #T2DM A1c 07/2020  = 7.8.   -Repeat A1c -Trend CBG -Continue home Glipizide and Jardiance -SSI  Diet: Carb-Modified VTE: SCDs Code: Full  Dispo: Admit patient to Observation with expected length of stay less than 2 midnights.  Signed: Carmina Miller, DO Internal Medicine Resident PGY-1 02/15/2023, 5:58 PM

## 2023-02-16 ENCOUNTER — Encounter (HOSPITAL_COMMUNITY): Payer: Self-pay | Admitting: Student in an Organized Health Care Education/Training Program

## 2023-02-16 ENCOUNTER — Observation Stay (HOSPITAL_COMMUNITY): Payer: BC Managed Care – PPO

## 2023-02-16 DIAGNOSIS — E7800 Pure hypercholesterolemia, unspecified: Secondary | ICD-10-CM | POA: Diagnosis present

## 2023-02-16 DIAGNOSIS — I609 Nontraumatic subarachnoid hemorrhage, unspecified: Secondary | ICD-10-CM

## 2023-02-16 DIAGNOSIS — E11649 Type 2 diabetes mellitus with hypoglycemia without coma: Secondary | ICD-10-CM | POA: Diagnosis not present

## 2023-02-16 DIAGNOSIS — S02119A Unspecified fracture of occiput, initial encounter for closed fracture: Secondary | ICD-10-CM | POA: Diagnosis present

## 2023-02-16 DIAGNOSIS — W11XXXA Fall on and from ladder, initial encounter: Secondary | ICD-10-CM | POA: Diagnosis present

## 2023-02-16 DIAGNOSIS — Z23 Encounter for immunization: Secondary | ICD-10-CM | POA: Diagnosis not present

## 2023-02-16 DIAGNOSIS — R519 Headache, unspecified: Secondary | ICD-10-CM | POA: Diagnosis present

## 2023-02-16 DIAGNOSIS — Z7984 Long term (current) use of oral hypoglycemic drugs: Secondary | ICD-10-CM | POA: Diagnosis not present

## 2023-02-16 DIAGNOSIS — S065XAA Traumatic subdural hemorrhage with loss of consciousness status unknown, initial encounter: Secondary | ICD-10-CM | POA: Diagnosis present

## 2023-02-16 DIAGNOSIS — Z79899 Other long term (current) drug therapy: Secondary | ICD-10-CM | POA: Diagnosis not present

## 2023-02-16 DIAGNOSIS — S069XAD Unspecified intracranial injury with loss of consciousness status unknown, subsequent encounter: Secondary | ICD-10-CM | POA: Diagnosis not present

## 2023-02-16 DIAGNOSIS — I10 Essential (primary) hypertension: Secondary | ICD-10-CM | POA: Diagnosis present

## 2023-02-16 DIAGNOSIS — Z833 Family history of diabetes mellitus: Secondary | ICD-10-CM | POA: Diagnosis not present

## 2023-02-16 DIAGNOSIS — E1165 Type 2 diabetes mellitus with hyperglycemia: Secondary | ICD-10-CM | POA: Diagnosis present

## 2023-02-16 DIAGNOSIS — G44209 Tension-type headache, unspecified, not intractable: Secondary | ICD-10-CM | POA: Diagnosis present

## 2023-02-16 DIAGNOSIS — S069XAA Unspecified intracranial injury with loss of consciousness status unknown, initial encounter: Secondary | ICD-10-CM | POA: Diagnosis present

## 2023-02-16 LAB — GLUCOSE, CAPILLARY
Glucose-Capillary: 92 mg/dL (ref 70–99)
Glucose-Capillary: 41 mg/dL — CL (ref 70–99)
Glucose-Capillary: 207 mg/dL — ABNORMAL HIGH (ref 70–99)
Glucose-Capillary: 57 mg/dL — ABNORMAL LOW (ref 70–99)
Glucose-Capillary: 106 mg/dL — ABNORMAL HIGH (ref 70–99)
Glucose-Capillary: 129 mg/dL — ABNORMAL HIGH (ref 70–99)

## 2023-02-16 LAB — HIV ANTIBODY (ROUTINE TESTING W REFLEX): HIV Screen 4th Generation wRfx: NONREACTIVE

## 2023-02-16 MED ORDER — DEXTROSE 50 % IV SOLN
25.0000 g | INTRAVENOUS | Status: AC
  Administered 2023-02-16: 50 g via INTRAVENOUS

## 2023-02-16 MED ORDER — ORAL CARE MOUTH RINSE: 15.0000 mL | OROMUCOSAL | Status: DC | PRN

## 2023-02-16 MED ORDER — KETOROLAC TROMETHAMINE 15 MG/ML IJ SOLN
15.0000 mg | Freq: Four times a day (QID) | INTRAMUSCULAR | Status: DC
  Administered 2023-02-16 – 2023-02-17 (×6): 15 mg via INTRAVENOUS
  Filled 2023-02-16 (×6): qty 1

## 2023-02-16 MED ORDER — INFLUENZA VIRUS VACC SPLIT PF (FLUZONE) 0.5 ML IM SUSY
0.5000 mL | PREFILLED_SYRINGE | INTRAMUSCULAR | Status: AC
  Administered 2023-02-17: 0.5 mL via INTRAMUSCULAR
  Filled 2023-02-16: qty 0.5

## 2023-02-16 MED ORDER — ACETAMINOPHEN 500 MG PO TABS
1000.0000 mg | ORAL_TABLET | Freq: Three times a day (TID) | ORAL | Status: DC
  Administered 2023-02-16 – 2023-02-17 (×5): 1000 mg via ORAL
  Filled 2023-02-16 (×5): qty 2

## 2023-02-16 MED ORDER — BACLOFEN 10 MG PO TABS
10.0000 mg | ORAL_TABLET | Freq: Three times a day (TID) | ORAL | Status: DC
  Administered 2023-02-16 – 2023-02-17 (×5): 10 mg via ORAL
  Filled 2023-02-16 (×5): qty 1

## 2023-02-16 MED ORDER — ACETAMINOPHEN 500 MG PO TABS: 1000.0000 mg | ORAL_TABLET | Freq: Three times a day (TID) | ORAL | Status: DC

## 2023-02-16 MED ORDER — IBUPROFEN 200 MG PO TABS: 800.0000 mg | ORAL_TABLET | Freq: Two times a day (BID) | ORAL | Status: DC

## 2023-02-16 MED ORDER — DEXTROSE 50 % IV SOLN
INTRAVENOUS | Status: AC
  Filled 2023-02-16: qty 50

## 2023-02-16 NOTE — Plan of Care (Signed)

## 2023-02-16 NOTE — Progress Notes (Signed)
Hypoglycemic Event  CBG: 57  Treatment: 8 oz juice/soda  Symptoms: None  Follow-up CBG: Time:1640 CBG Result:106  Possible Reasons for Event: Medication regimen:    MD Response: medication d/c'd   Juluis Mire

## 2023-02-16 NOTE — Progress Notes (Addendum)
Subjective Patient continues to endorse headache that is a 5/10. Had another episode of nausea with vomiting this morning that responded well to Zofran.   Physical exam Blood pressure 112/71, pulse 66, temperature 98.4 F (36.9 C), temperature source Oral, resp. rate 18, height 5\' 3"  (1.6 m), weight 66.3 kg, SpO2 97%.  Constitutional: well-appearing, lying in bed, in no acute distress Cardiovascular: regular rate and rhythm, no m/r/g Pulmonary/Chest: normal work of breathing on room air, lungs clear to auscultation bilaterally MSK: normal bulk and tone Neurological: alert & oriented x 3, no focal deficit   Weight change:    Intake/Output Summary (Last 24 hours) at 02/16/2023 0958 Last data filed at 02/16/2023 0900 Gross per 24 hour  Intake 1720 ml  Output 1050 ml  Net 670 ml   Net IO Since Admission: 670 mL [02/16/23 0958]  Labs, images, and other studies    Latest Ref Rng & Units 02/15/2023   10:15 AM 02/11/2023    7:36 PM 09/18/2019    4:51 PM  CBC  WBC 4.0 - 10.5 K/uL 7.4  14.1  7.4   Hemoglobin 13.0 - 17.0 g/dL 16.1  09.6  04.5   Hematocrit 39.0 - 52.0 % 45.7  46.8  45.3   Platelets 150 - 400 K/uL 213  204  258       Latest Ref Rng & Units 02/15/2023   10:15 AM 02/11/2023    7:36 PM 08/17/2020    2:14 PM  BMP  Glucose 70 - 99 mg/dL 409  98  811   BUN 8 - 23 mg/dL 14  17  13    Creatinine 0.61 - 1.24 mg/dL 9.14  7.82  9.56   BUN/Creat Ratio 9 - 20   20   Sodium 135 - 145 mmol/L 135  135  137   Potassium 3.5 - 5.1 mmol/L 4.0  4.0  5.0   Chloride 98 - 111 mmol/L 101  100  97   CO2 22 - 32 mmol/L 25  26  25    Calcium 8.9 - 10.3 mg/dL 9.0  9.2  9.9       Assessment and plan Hospital day 1  Jemarion Ogburn is a 62 y.o.male with PMH of traumatic SAH w/ LOC 9/16, T2DM, HTN, and HLD who presented to the ED 9/20 with complains of headache with nausea and vomiting.    Principal Problem:   TBI (traumatic brain injury) (HCC) Active Problems:   Diabetes  mellitus (HCC)   HTN (hypertension)   Subdural hematoma caused by concussion (HCC)   SAH (subarachnoid hemorrhage) (HCC)  #Subdural Hematoma #TBI #Post-Concussive Syndrome  CT Head yesterday showed new 7 mm subdural hematoma vs hygroma along left cerebral convexity; no midline shift. Imaging also noted interval improvement of previous SAH. Repeat CT Head this morning further characterized fluid collection as a subdural hygroma that had not progressed. No significant mass effect. Today, patient with continued headache that is tension-like with discomfort along forehead to left occiput. Also continues to endorse nausea with additional episode of vomiting this morning. Patient states headache goes from 7 to 5/10 after Tylenol and n/v responds well to Zofran. Neurosurgery saw patient today and does not see an indication for neurosurgical intervention and states he is stable for discharge from their perspective. Patient counseled on the fact that these symptoms will take time to resolve.  -PT/OT consult ordered -Tylenol 1,000 mg TID -Start  IV Toradol 15 mg q 6 -Start Baclofen 10 mg TID -Zofran 4 mg q 6 PRN -Neuro checks q 4   #HTN Normotensive -Continue home Lisinopril   #HLD LDL in 2022 was 70. -Continue home Lipitor   #T2DM A1c this admission  = 6.8. RN noted CBG of 41 at 11:03 AM. Patient given D50. Will discontinue Glipizide. -Trend CBG -Continue Jardiance in addition to SSI -Hold home Metformin  Diet: Carb-Modified VTE: SCDs Code: Full Discharge plan: Most likely in the next day or so given clinical improvement   Carmina Miller, DO 02/16/2023, 9:58 AM  Pager: [phone] After 5pm or weekend: [phone]

## 2023-02-16 NOTE — Plan of Care (Signed)
Problem: Education: Goal: Ability to describe self-care measures that may prevent or decrease complications (Diabetes Survival Skills Education) will improve 02/16/2023 1433 by Juluis Mire, RN Outcome: Progressing 02/16/2023 1433 by Juluis Mire, RN Outcome: Progressing Goal: Individualized Educational Video(s) 02/16/2023 1433 by Juluis Mire, RN Outcome: Progressing 02/16/2023 1433 by Juluis Mire, RN Outcome: Progressing   Problem: Coping: Goal: Ability to adjust to condition or change in health will improve 02/16/2023 1433 by Juluis Mire, RN Outcome: Progressing 02/16/2023 1433 by Juluis Mire, RN Outcome: Progressing   Problem: Fluid Volume: Goal: Ability to maintain a balanced intake and output will improve 02/16/2023 1433 by Juluis Mire, RN Outcome: Progressing 02/16/2023 1433 by Juluis Mire, RN Outcome: Progressing   Problem: Health Behavior/Discharge Planning: Goal: Ability to identify and utilize available resources and services will improve 02/16/2023 1433 by Juluis Mire, RN Outcome: Progressing 02/16/2023 1433 by Juluis Mire, RN Outcome: Progressing Goal: Ability to manage health-related needs will improve 02/16/2023 1433 by Juluis Mire, RN Outcome: Progressing 02/16/2023 1433 by Juluis Mire, RN Outcome: Progressing   Problem: Metabolic: Goal: Ability to maintain appropriate glucose levels will improve 02/16/2023 1433 by Juluis Mire, RN Outcome: Progressing 02/16/2023 1433 by Juluis Mire, RN Outcome: Progressing   Problem: Nutritional: Goal: Maintenance of adequate nutrition will improve 02/16/2023 1433 by Juluis Mire, RN Outcome: Progressing 02/16/2023 1433 by Juluis Mire, RN Outcome: Progressing Goal: Progress toward achieving an optimal weight will improve 02/16/2023 1433 by Juluis Mire, RN Outcome: Progressing 02/16/2023 1433 by  Juluis Mire, RN Outcome: Progressing   Problem: Skin Integrity: Goal: Risk for impaired skin integrity will decrease 02/16/2023 1433 by Juluis Mire, RN Outcome: Progressing 02/16/2023 1433 by Juluis Mire, RN Outcome: Progressing   Problem: Tissue Perfusion: Goal: Adequacy of tissue perfusion will improve 02/16/2023 1433 by Juluis Mire, RN Outcome: Progressing 02/16/2023 1433 by Juluis Mire, RN Outcome: Progressing   Problem: Education: Goal: Knowledge of General Education information will improve Description: Including pain rating scale, medication(s)/side effects and non-pharmacologic comfort measures 02/16/2023 1433 by Juluis Mire, RN Outcome: Progressing 02/16/2023 1433 by Juluis Mire, RN Outcome: Progressing   Problem: Health Behavior/Discharge Planning: Goal: Ability to manage health-related needs will improve 02/16/2023 1433 by Juluis Mire, RN Outcome: Progressing 02/16/2023 1433 by Juluis Mire, RN Outcome: Progressing   Problem: Clinical Measurements: Goal: Ability to maintain clinical measurements within normal limits will improve 02/16/2023 1433 by Juluis Mire, RN Outcome: Progressing 02/16/2023 1433 by Juluis Mire, RN Outcome: Progressing Goal: Will remain free from infection 02/16/2023 1433 by Juluis Mire, RN Outcome: Progressing 02/16/2023 1433 by Juluis Mire, RN Outcome: Progressing Goal: Diagnostic test results will improve 02/16/2023 1433 by Juluis Mire, RN Outcome: Progressing 02/16/2023 1433 by Juluis Mire, RN Outcome: Progressing Goal: Respiratory complications will improve 02/16/2023 1433 by Juluis Mire, RN Outcome: Progressing 02/16/2023 1433 by Juluis Mire, RN Outcome: Progressing Goal: Cardiovascular complication will be avoided 02/16/2023 1433 by Juluis Mire, RN Outcome: Progressing 02/16/2023 1433 by Juluis Mire, RN Outcome: Progressing   Problem: Activity: Goal: Risk for activity intolerance will decrease 02/16/2023 1433 by Juluis Mire, RN Outcome: Progressing 02/16/2023 1433 by Juluis Mire, RN Outcome: Progressing   Problem: Nutrition: Goal: Adequate nutrition will be maintained 02/16/2023 1433 by Juluis Mire, RN Outcome: Progressing 02/16/2023 1433 by Juluis Mire, RN Outcome: Progressing  Problem: Coping: Goal: Level of anxiety will decrease 02/16/2023 1433 by Juluis Mire, RN Outcome: Progressing 02/16/2023 1433 by Juluis Mire, RN Outcome: Progressing   Problem: Elimination: Goal: Will not experience complications related to bowel motility 02/16/2023 1433 by Juluis Mire, RN Outcome: Progressing 02/16/2023 1433 by Juluis Mire, RN Outcome: Progressing Goal: Will not experience complications related to urinary retention 02/16/2023 1433 by Juluis Mire, RN Outcome: Progressing 02/16/2023 1433 by Juluis Mire, RN Outcome: Progressing   Problem: Pain Managment: Goal: General experience of comfort will improve 02/16/2023 1433 by Juluis Mire, RN Outcome: Progressing 02/16/2023 1433 by Juluis Mire, RN Outcome: Progressing   Problem: Safety: Goal: Ability to remain free from injury will improve 02/16/2023 1433 by Juluis Mire, RN Outcome: Progressing 02/16/2023 1433 by Juluis Mire, RN Outcome: Progressing   Problem: Skin Integrity: Goal: Risk for impaired skin integrity will decrease 02/16/2023 1433 by Juluis Mire, RN Outcome: Progressing 02/16/2023 1433 by Juluis Mire, RN Outcome: Progressing

## 2023-02-16 NOTE — Evaluation (Addendum)
Physical Therapy Evaluation Patient Details Name: Alexander Parker MRN: [id number] DOB: 03/12/1961 Today's Date: 02/16/2023  History of Present Illness  62 y.o. male presented to the Ed 9/20 with worsening headache and nausea after recent fall from a ladder on 9/16 that resulted in a tSAH and skull fracture for which he was admitted and later d/c once stable. CTH interpreted by neurosurgical PA - "increased size of chronic appearing SDH to 7mm, no midline shift, improvement in tSAH compared to prior scan. Repeat scan is stable without and progression of SDH." PMHx: DM, HTN, HLD.    Clinical Impression  Pt admitted with above diagnosis. Mobilizing well after d/c home recently but getting very dizzy when he rolls over in bed and tries to sit up, causing nausea and vomiting. Currently mobilizing safely in room at a mod I level without overt instability or concerning balance issues. Looked further into vestibular-like complaints (see details below.) Noted to have [employer] over [employer]. With head in dependent positions, there is prolonged, direction changing nystagmus and subjective vertigo consistent with the symptoms he was experiencing at home which lead him to return to the hospital. I suspect this vertigo is of central etiology from his traumatic head injury and bleed. BPPV of multi-canal involvement cannot be fully excluded as this could be superimposed and difficult to confirm with the acuity of his TBI. Extensive education provided to pt and family. Recommend following up with vestibular PT at outpatient neuro rehab clinic.  Vestibular assessment: Complains of dizziness with bed level mobility (positional vertigo.) Findings: Dix hallpike left - left torsional upbeating nystagmus >89min, Dix hallpike right - Rt torsional nystagmus approx 1 min duration, Rt sidelying horiziontal canal - apogeotropic nystagmus >1 minute, Lt sidelying horiziontal canal - geotropic nystagmus >1 minute.) Saccadic  tracking, hypometric and off target with upward gaze and tracking in upper fields. Denies motion sensitivity (meds suppressing?)         If plan is discharge home, recommend the following: Assist for transportation   Can travel by private vehicle   yes     Equipment Recommendations None recommended by PT     Functional Status Assessment Patient has had a recent decline in their functional status and demonstrates the ability to make significant improvements in function in a reasonable and predictable amount of time.     Precautions / Restrictions Precautions Precautions: Fall Precaution Comments: N&V with position changes. Restrictions Weight Bearing Restrictions: No      Mobility  Bed Mobility Overal bed mobility: Modified Independent             General bed mobility comments: extra time, slow, dizzy    Transfers Overall transfer level: Modified independent Equipment used: None               General transfer comment: slower to rise but stable, no assist needed    Ambulation/Gait Ambulation/Gait assistance: Modified independent (Device/Increase time) Gait Distance (Feet): 60 Feet Assistive device: None Gait Pattern/deviations: WFL(Within Functional Limits) Gait velocity: decr Gait velocity interpretation: 1.31 - 2.62 ft/sec, indicative of limited community ambulator   General Gait Details: gait WFL, minor drift from midline with horiziontal head turns and nods but no overt LOB and did not require physical assistance.  Stairs            Wheelchair Mobility     Tilt Bed    Modified Rankin (Stroke Patients Only)       Balance Overall balance assessment: Modified Independent  Pertinent Vitals/Pain Pain Assessment Pain Assessment: No/denies pain (Reports better now after medication he received earlier.)    Home Living Family/patient expects to be discharged to:: Private  residence Living Arrangements: Spouse/significant other Available Help at Discharge: Family Type of Home: Apartment Home Access: Stairs to enter Entrance Stairs-Rails: Doctor, general practice of Steps: 12   Home Layout:  (Lives on 2nd floor of apartment.) Home Equipment: None      Prior Function Prior Level of Function : Independent/Modified Independent;Working/employed;Driving             Mobility Comments: Ind and working prior to fall on 9/16. Pt has since returned home and is again independent without an assistive device but has to move slowly in bed due to nausea and dizziness. ADLs Comments: ind, slow in doing things because of dizziness since fall.     Extremity/Trunk Assessment   Upper Extremity Assessment Upper Extremity Assessment: Defer to OT evaluation    Lower Extremity Assessment Lower Extremity Assessment: Overall WFL for tasks assessed       Communication   Communication Communication: No apparent difficulties;Other (comment) (Interpreter utilized (Spanish))  Cognition Arousal: Alert Behavior During Therapy: WFL for tasks assessed/performed Overall Cognitive Status: Within Functional Limits for tasks assessed                                 General Comments: oriented and family reports seems to be at baseline.        General Comments General comments (skin integrity, edema, etc.): Complains of dizziness with bed level mobility. Findings: (Dix hallpike left - left torsional upbeating nystagmus >71min, Dix hallpike right - Rt torsional nystagmus approx 1 min duration, Rt sidelying horiziontal canal - apogeotropic nystagmus >1 minute, Lt sidelying horiziontal canal - geotropic nystagmus >1 minute.) Saccadic tracking, hypometric and off target with upward gaze and tracking in upper fields. Denies motion sensitivity (meds suppressing?) Subjective vertigo in all positions, nausea. Extensive eduction provided to pt and family, discussing  symptoms, awareness, safety, self-care to minimize vertigo, and follow-up recommendations (neuro and vestibular PT.)    Exercises Other Exercises Other Exercises: Gaze fixation for suppression of vertigo   Assessment/Plan    PT Assessment Patient needs continued PT services  PT Problem List Decreased activity tolerance;Decreased mobility;Pain       PT Treatment Interventions Stair training;Gait training;Functional mobility training;Therapeutic activities;Therapeutic exercise;Neuromuscular re-education;Patient/family education;Canalith reposition    PT Goals (Current goals can be found in the Care Plan section)  Acute Rehab PT Goals Patient Stated Goal: feel better PT Goal Formulation: With patient/family Time For Goal Achievement: 02/23/23 Potential to Achieve Goals: Good    Frequency Min 1X/week     Co-evaluation               AM-PAC PT "6 Clicks" Mobility  Outcome Measure Help needed turning from your back to your side while in a flat bed without using bedrails?: None Help needed moving from lying on your back to sitting on the side of a flat bed without using bedrails?: None Help needed moving to and from a bed to a chair (including a wheelchair)?: None Help needed standing up from a chair using your arms (e.g., wheelchair or bedside chair)?: None Help needed to walk in hospital room?: None Help needed climbing 3-5 steps with a railing? : None 6 Click Score: 24    End of Session   Activity Tolerance: Patient tolerated treatment well Patient left:  in bed;with call bell/phone within reach;with family/visitor present Nurse Communication: Mobility status PT Visit Diagnosis: Dizziness and giddiness (R42);Other symptoms and signs involving the nervous system (R29.898);BPPV;Pain BPPV - Right/Left :  (n/a at this time) Pain - part of body:  (headache)    Time: 2952-8413 PT Time Calculation (min) (ACUTE ONLY): 33 min   Charges:   PT Evaluation $PT Eval Low  Complexity: 1 Low PT Treatments $Self Care/Home Management: 8-22 PT General Charges $$ ACUTE PT VISIT: 1 Visit         Kathlyn [place], PT, DPT [facility]  Rehabilitation Services Physical Therapist Office: [phone] Website: [email or web address]   Berton Mount 02/16/2023, 3:05 PM

## 2023-02-16 NOTE — Progress Notes (Signed)
Hypoglycemic Event  CBG: 41 at 1103am  Treatment: D50 50 mL (25 gm)  Symptoms: drowsiness  Follow-up CBG: Time:1130am   CBG Result:207  Possible Reasons for Event: Vomiting and Inadequate meal intake  Comments/MD notified:n/a    Juluis Mire

## 2023-02-16 NOTE — Consult Note (Signed)
Neurosurgery Consultation  Reason for Consult: SDH Referring Physician: Oswaldo Done MD   CC: headache and nausea   HPI: This is a 62 y.o. male presented to the [facility] on 9/16 after a fall off of a ladder. He was diagnosed with a tSAH and a skull fracture, repeat CTH was stable. He was asymptomatic and neurologically intact so he was discharged home. He presented back to the ED yesterday (02/15/23) with worsening headache and nausea. No new weakness, numbness, or parasthesias, no recent change in bowel or bladder function. No recent use of anti-platelet or anti-coagulant medications.   ROS: A 14 point ROS was performed and is negative except as noted in the HPI.   PMHx:  Past Medical History:  Diagnosis Date   Diabetes mellitus without complication (HCC)    Hyperlipidemia    Hypertension    FamHx:  Family History  Problem Relation Age of Onset   Diabetes Father    Esophageal cancer Maternal Uncle    Colon cancer Neg Hx    Colon polyps Neg Hx    Rectal cancer Neg Hx    Stomach cancer Neg Hx    SocHx:  reports that he has never smoked. He has never used smokeless tobacco. He reports that he does not drink alcohol and does not use drugs.  Exam: Vital signs in last 24 hours: Temp:  [96.2 F (35.7 C)-98.6 F (37 C)] 98.4 F (36.9 C) (09/21 0858) Pulse Rate:  [62-85] 66 (09/21 0858) Resp:  [10-18] 18 (09/21 0858) BP: (105-140)/(57-74) 112/71 (09/21 0858) SpO2:  [97 %-100 %] 97 % (09/21 0858) Weight:  [66.3 kg] 66.3 kg (09/20 2318) General: Awake, alert, cooperative, lying in bed in NAD Head: Normocephalic and atruamatic HEENT: Neck supple Pulmonary: breathing room air comfortably, no evidence of increased work of breathing Cardiac: RRR Abdomen: S NT ND Extremities: Warm and well perfused x4 Neuro: AOx3, PERRL, EOMI, FS, no drift  Strength 5/5 x4, SILTx4   Assessment and Plan: 62 y.o. male with SDH with worsening headache and nausea. CTH personally reviewed, which shows  increased size of chronic appearing SDH to 7mm, no midline shift, improvement in tSAH compared to prior scan. Repeat scan is stable without and progression of SDH. He reports that his nausea has resolved and the headache is significantly improved with Tylenol. Given the small size of the SDH and his intact neurologic exam it is not likely that his SDH is the cause of his headaches or nausea.   -no acute neurosurgical intervention indicated at this time -continue work up via medicine for other potential causes of his symptoms -he is stable for discharge from a NSGY standpoint once medically stable  -please call with any concerns or questions  Iran Sizer, PA-C 02/16/23 10:16 AM [facility]

## 2023-02-17 ENCOUNTER — Other Ambulatory Visit: Payer: Self-pay | Admitting: Student

## 2023-02-17 DIAGNOSIS — S069X9D Unspecified intracranial injury with loss of consciousness of unspecified duration, subsequent encounter: Secondary | ICD-10-CM

## 2023-02-17 LAB — GLUCOSE, CAPILLARY
Glucose-Capillary: 146 mg/dL — ABNORMAL HIGH (ref 70–99)
Glucose-Capillary: 99 mg/dL (ref 70–99)

## 2023-02-17 MED ORDER — ACETAMINOPHEN 500 MG PO TABS: 1000.0000 mg | ORAL_TABLET | Freq: Three times a day (TID) | ORAL | Status: AC

## 2023-02-17 MED ORDER — IBUPROFEN 800 MG PO TABS: 800.0000 mg | ORAL_TABLET | Freq: Three times a day (TID) | ORAL | Status: DC | PRN

## 2023-02-17 MED ORDER — BACLOFEN 10 MG PO TABS: 10.0000 mg | ORAL_TABLET | Freq: Three times a day (TID) | ORAL | 0 refills | Status: DC

## 2023-02-17 NOTE — Discharge Instructions (Addendum)
Al Sr. Alexander Parker o sus cuidadores,  Fueron admitidos en el [facility] el 20/01/2023 para evaluacin y tratamiento de:  Problema principal:   TBI (lesin cerebral traumtica) ([facility]) Problemas activos:   Diabetes mellitus ([facility])   HTA (hipertensin)   Hematoma subdural causado por conmocin cerebral ([facility])  Problemas resueltos:   HSA (hemorragia subaracnoidea) ([facility])  La evaluacin sugiri conmocin cerebral. Fueron tratados con .  Fueron dados de alta del hospital el 22/09/24. Recomiendo lo siguiente despus de salir del hospital:   Tome Tylenol 1 g (2 tabletas extra fuertes) cada 8 horas segn sea necesario para el dolor de cabeza.  Tome 800 mg de ibuprofeno cada 8 horas segn sea necesario para el dolor de [place].  Trate de tomar entre dosis de Tylenol para Development worker, international aid.  Tome 1 tableta de baclofeno 3 veces al da segn sea necesario para el dolor de [place].  Deje de tomar glipizida hasta que tenga una consulta de seguimiento con su mdico de atencin primaria.  Haga un seguimiento con su mdico de cabecera lo antes posible.  Puede llamar al [phone] y preguntar por el residente mayor de guardia si tiene preguntas sobre su atencin hasta que pueda realizar el seguimiento con su mdico de atencin primaria.  Marrianne Mood MD 22/01/2023 14:27  ----------------------------------------  To [name] or their caretakers,  They were admitted to [facility] on 02/15/2023 for evaluation and treatment of:  Principal Problem:   TBI (traumatic brain injury) [facility]) Active Problems:   Diabetes mellitus (HCC)   HTN (hypertension)   Subdural hematoma caused by concussion [facility])  Resolved Problems:   SAH (subarachnoid hemorrhage) (HCC)  The evaluation suggested concussion. They were treated with .  They were discharged from the hospital on 02/17/23. I recommend the following after leaving the hospital:   Take Tylenol 1 g  (2 extra strength tablets) every 8 hours as needed for headache.  Take ibuprofen 800 mg every 8 hours as needed for headache.  Try to take in between Tylenol doses for the best pain control.  Take baclofen 1 tablet 3 times a day as needed for headache.  Stop taking your glipizide until you follow-up with your primary doctor.  Follow-up with your primary doctor as soon as possible.  You may call [phone] and ask for the senior resident on-call for questions about your care until you are able to follow-up with your primary doctor.  Marrianne Mood MD 02/17/2023, 2:27 PM

## 2023-02-17 NOTE — Progress Notes (Signed)
Physical Therapy Treatment  Patient Details Name: Alexander Parker MRN: [id number] DOB: October 28, 1960 Today's Date: 02/17/2023   History of Present Illness 62 y.o. male presented to the Ed 9/20 with worsening headache and nausea after recent fall from a ladder on 9/16 that resulted in a tSAH and skull fracture for which he was admitted and later d/c once stable. CTH interpreted by neurosurgical PA - "increased size of chronic appearing SDH to 7mm, no midline shift, improvement in tSAH compared to prior scan. Repeat scan is stable without and progression of SDH." PMHx: DM, HTN, HLD.    PT Comments  Session focused on gaze stabilization techniques and segmental turning for accommodation during functional mobility. Pt tolerated mobility well utilizing these techniques and was able to tolerate ~300' ambulation in hall with direction changes and no reports of dizziness. Pt completed stair training and was able to negotiate a flight of stairs without reports of dizziness. At end of session pt was instructed in vestibular x1 exercises (both horizontal and vertical). Noted pt with difficulty maintaining gaze on smaller target but improved with larger target. Recommended 30' intervals as tolerated, starting with low velocity and low amplitude head turns, and progressing as able. Pt continues to be at risk for falls during any dizzy spells that may continue to occur, especially in the shower while washing/rinsing hair, and bending for lower body hygiene. Recommend a shower chair to decrease risk for falls and to facilitate ease of gaze stabilization during ADL's. Will continue to follow.    If plan is discharge home, recommend the following: Assist for transportation   Can travel by private vehicle        Equipment Recommendations  Rolling walker (2 wheels);Other (comment) (shower chair (BSC if not able to get shower chair))    Recommendations for Other Services       Precautions / Restrictions  Precautions Precautions: Fall Precaution Comments: N&V with position changes. Restrictions Weight Bearing Restrictions: No     Mobility  Bed Mobility Overal bed mobility: Modified Independent             General bed mobility comments: Increased time and cues for visual fixation and segmental movement to sit EOB.    Transfers Overall transfer level: Modified independent Equipment used: None               General transfer comment: VC's for gaze stabilization on target and to maintain during sit<>stand.    Ambulation/Gait Ambulation/Gait assistance: Modified independent (Device/Increase time) Gait Distance (Feet): 300 Feet Assistive device: Rolling walker (2 wheels) Gait Pattern/deviations: WFL(Within Functional Limits) Gait velocity: Decreased Gait velocity interpretation: 1.31 - 2.62 ft/sec, indicative of limited community ambulator   General Gait Details: RW helpful for stability however no overt LOB noted. Pt cued for gaze stabilization on a target before turning body while navigating around the hallway.   Stairs Stairs: Yes Stairs assistance: Contact guard assist Stair Management: One rail Right, Step to pattern, Forwards Number of Stairs: 10 General stair comments: VC's for gaze stabilization techniques and step-to pattern for safety. Pt performed well including segmental turning at the top of the stairs to prepare for descent.   Wheelchair Mobility     Tilt Bed    Modified Rankin (Stroke Patients Only)       Balance Overall balance assessment: Modified Independent  Cognition Arousal: Alert Behavior During Therapy: WFL for tasks assessed/performed Overall Cognitive Status: Within Functional Limits for tasks assessed                                          Exercises Other Exercises Other Exercises: gaze stabilization activity Other Exercises: x1 exercises;  horizontal and vertical    General Comments        Pertinent Vitals/Pain Pain Assessment Pain Assessment: No/denies pain    Home Living                          Prior Function            PT Goals (current goals can now be found in the care plan section) Acute Rehab PT Goals Patient Stated Goal: feel better PT Goal Formulation: With patient/family Time For Goal Achievement: 02/23/23 Potential to Achieve Goals: Good Progress towards PT goals: Progressing toward goals    Frequency    Min 1X/week      PT Plan      Co-evaluation              AM-PAC PT "6 Clicks" Mobility   Outcome Measure  Help needed turning from your back to your side while in a flat bed without using bedrails?: None Help needed moving from lying on your back to sitting on the side of a flat bed without using bedrails?: None Help needed moving to and from a bed to a chair (including a wheelchair)?: None Help needed standing up from a chair using your arms (e.g., wheelchair or bedside chair)?: None Help needed to walk in hospital room?: None Help needed climbing 3-5 steps with a railing? : A Little 6 Click Score: 23    End of Session Equipment Utilized During Treatment: Gait belt Activity Tolerance: Patient tolerated treatment well Patient left: in bed;with call bell/phone within reach;with family/visitor present Nurse Communication: Mobility status PT Visit Diagnosis: Dizziness and giddiness (R42);Other symptoms and signs involving the nervous system (R29.898);BPPV;Pain BPPV - Right/Left :  (n/a at this time) Pain - part of body:  (headache)     Time: 1610-9604 PT Time Calculation (min) (ACUTE ONLY): 38 min  Charges:    $Gait Training: 8-22 mins $Therapeutic Activity: 8-22 mins $Self Care/Home Management: 8-22 PT General Charges $$ ACUTE PT VISIT: 1 Visit                     Conni Slipper, PT, DPT Acute Rehabilitation Services Secure Chat Preferred Office:  [phone]    Marylynn Pearson 02/17/2023, 2:56 PM

## 2023-02-17 NOTE — Plan of Care (Signed)
  Problem: Education: Goal: Ability to describe self-care measures that may prevent or decrease complications (Diabetes Survival Skills Education) will improve Outcome: Progressing Goal: Individualized Educational Video(s) Outcome: Progressing   Problem: Coping: Goal: Ability to adjust to condition or change in health will improve Outcome: Progressing   

## 2023-02-17 NOTE — Plan of Care (Signed)

## 2023-02-17 NOTE — Discharge Summary (Signed)
Name: Alexander Parker MRN: [id number] DOB: February 14, 1961 62 y.o. PCP: Lezlie Lye, Meda Coffee, MD  Date of Admission: 02/15/2023  9:48 AM Date of Discharge: 02/17/2023 5:19 PM Attending Physician: Tyson Alias, *  Discharge diagnosis: Principal Problem:   TBI (traumatic brain injury) [facility]) Active Problems:   Diabetes mellitus (HCC)   HTN (hypertension)   Subdural hematoma caused by concussion [facility])  Resolved Problems:   SAH (subarachnoid hemorrhage) (HCC)   Discharge medications: Allergies as of 02/17/2023   No Known Allergies      Medication List     STOP taking these medications    glipiZIDE 10 MG tablet Commonly known as: GLUCOTROL       TAKE these medications    Accu-Chek Guide test strip Generic drug: glucose blood USE UP TO 3 TIMES DAILY AS DIRECTED   Accu-Chek Softclix Lancets lancets USE UP TO 3 TIMES DAILY AS DIRECTED   acetaminophen 500 MG tablet Commonly known as: TYLENOL Take 2 tablets (1,000 mg total) by mouth 3 (three) times daily.   atorvastatin 40 MG tablet Commonly known as: LIPITOR TAKE ONE TABLET BY MOUTH ONCE DAILY FOR CHOLESTEROL   baclofen 10 MG tablet Commonly known as: LIORESAL Take 1 tablet (10 mg total) by mouth 3 (three) times daily.   blood glucose meter kit and supplies Kit Per insurance preference. Use up to three times daily as directed. Dx E11.65, Z79.8.   Gemtesa 75 MG Tabs Generic drug: Vibegron Take 1 tablet by mouth daily.   ibuprofen 800 MG tablet Commonly known as: ADVIL Take 1 tablet (800 mg total) by mouth every 8 (eight) hours as needed.   Jardiance 25 MG Tabs tablet Generic drug: empagliflozin Take 25 mg by mouth daily.   lisinopril 20 MG tablet Commonly known as: ZESTRIL Take 1 tablet by mouth once daily   metFORMIN 1000 MG tablet Commonly known as: GLUCOPHAGE TAKE 1 TABLET BY MOUTH TWICE DAILY WITH A MEAL   Pen Needles 32G X 6 MM Misc 1 each by Does not apply route daily.   tamsulosin  0.4 MG Caps capsule Commonly known as: FLOMAX Take 1 capsule (0.4 mg total) by mouth daily.               Durable Medical Equipment  (From admission, onward)           Start     Ordered   02/17/23 1450  For home use only DME Walker rolling  Once       Question Answer Comment  Walker: With 5 Inch Wheels   Patient needs a walker to treat with the following condition Vertigo      02/17/23 1449            Follow-up appointments:  Follow-up Information     [facility] Follow up.   Contact information: [address] [place] [address] [phone]                Disposition and recommendations: Alexander Parker is a 62 y.o. year old admitted for headache due to concussion.  Discharged in good condition on hospital day 1.  Concussion With headache and vertigo.  Small subdural hemorrhage not amenable to operative intervention and likely not the cause of this person symptoms.  Supportive therapy and counseling regarding expectations. - Scheduled Tylenol and ibuprofen - Baclofen for tension type features - Lots of rest and hydration - Out of work through 03/04/2023  Diabetes Complicated by hyperglycemia.  Glipizide was discontinued at discharge. 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  On day of fall he went to ED and was found to have small subarachnoid hemorrhage but was stable and discharged home after neurosurgical evaluation.  His condition did not improve.  On reevaluation he had interval development of small left subdural hematoma, not thought to be the source of his symptoms.  Neuroexam was normal.  His diagnosis was concussion complicated by headache and vertigo.  He was treated with supportive therapy including Tylenol and NSAIDs with muscle  relaxer for tension type features of his headache and ondansetron for nausea.  He was discharged in good condition to continue Tylenol, ibuprofen, and baclofen.  He will follow-up with internal medicine teaching service.  Anticipate improvement over several days to weeks.  Diabetes On metformin, SGLT2, and sulfonylurea.  He had an episode of hypoglycemia while hospitalized, treated with D50.  The sulfonylurea was discontinued and not restarted at discharge.  Discharge exam: Spending more time feeling well in between headaches.  Still with some dizziness on position change.  Otherwise feeling better and ready to discharge home.   Blood pressure 121/87, pulse 63, temperature 98.1 F (36.7 C), temperature source Oral, resp. rate 15, height 5\' 3"  (1.6 m), weight 66.3 kg, SpO2 97%.  No distress Heart rate and rhythm normal, radial pulse strong Breathing comfortably on room air Skin is warm and dry Alert and oriented, no facial asymmetry, speech is normal sounding, moves all extremities  Pertinent studies and procedures: No results found for this or any previous visit (from the past 46962 hour(s)).  Imaging Orders         CT Head Wo Contrast         CT HEAD WO CONTRAST ( )     Lab Orders         Basic metabolic panel         CBC         Urinalysis, Routine w reflex microscopic -Urine, Random         HIV Antibody (routine testing w rflx)         Hemoglobin A1c         Glucose, capillary         Glucose, capillary         Glucose, capillary         Glucose, capillary         Glucose, capillary         Glucose, capillary         Glucose, capillary         Glucose, capillary         CBG monitoring, ED     Discharge Instructions:   Discharge Instructions      Al Sr. Ignacia Palma o sus cuidadores,  Fueron admitidos en el [facility] el 20/01/2023 para evaluacin y tratamiento de:  Problema principal:   TBI (lesin cerebral traumtica) ([facility]) Problemas  activos:   Diabetes mellitus ([facility])   HTA (hipertensin)   Hematoma subdural causado por conmocin cerebral ([facility])  Problemas resueltos:   HSA (hemorragia subaracnoidea) ([facility])  La evaluacin sugiri conmocin cerebral. Fueron tratados con .  Fueron dados de alta del hospital el 22/09/24. Recomiendo lo siguiente despus de salir del hospital:   Tome Tylenol 1 g (2 tabletas extra fuertes) cada 8 horas segn  sea necesario para el dolor de cabeza.  Tome 800 mg de ibuprofeno cada 8 horas segn sea necesario para el dolor de [place].  Trate de tomar entre dosis de Tylenol para Development worker, international aid.  Tome 1 tableta de baclofeno 3 veces al da segn sea necesario para el dolor de [place].  Deje de tomar glipizida hasta que tenga una consulta de seguimiento con su mdico de atencin primaria.  Haga un seguimiento con su mdico de cabecera lo antes posible.  Puede llamar al [phone] y preguntar por el residente mayor de guardia si tiene preguntas sobre su atencin hasta que pueda realizar el seguimiento con su mdico de atencin primaria.  Marrianne Mood MD 22/01/2023 14:27  ----------------------------------------  To Alexander Parker or their caretakers,  They were admitted to [facility] on 02/15/2023 for evaluation and treatment of:  Principal Problem:   TBI (traumatic brain injury) [facility]) Active Problems:   Diabetes mellitus (HCC)   HTN (hypertension)   Subdural hematoma caused by concussion [facility])  Resolved Problems:   SAH (subarachnoid hemorrhage) (HCC)  The evaluation suggested concussion. They were treated with .  They were discharged from the hospital on 02/17/23. I recommend the following after leaving the hospital:   Take Tylenol 1 g (2 extra strength tablets) every 8 hours as needed for headache.  Take ibuprofen 800 mg every 8 hours as needed for headache.  Try to take in between Tylenol doses for the best pain control.  Take baclofen 1 tablet  3 times a day as needed for headache.  Stop taking your glipizide until you follow-up with your primary doctor.  Follow-up with your primary doctor as soon as possible.  You may call [phone] and ask for the senior resident on-call for questions about your care until you are able to follow-up with your primary doctor.  Marrianne Mood MD 02/17/2023, 2:27 PM       Marrianne Mood MD 02/17/2023, 5:19 PM

## 2023-02-17 NOTE — TOC Transition Note (Signed)
Transition of Care [facility]) - CM/SW Discharge Note   Patient Details  Name: Alexander Parker MRN: [id number] Date of Birth: [birth year]  Transition of Care [facility]) CM/SW Contact:  Lawerance Sabal, RN Phone Number: 02/17/2023, 3:46 PM   Clinical Narrative:     [name] w patient and family at bedside to discuss DC plans with [facility] interpreter. Patient agrees to be billed for RW and [employer]. These have been ordered through [facility] and will be delivered to the room today for DC.  Referral already in place for OP PT.         Patient Goals and CMS Choice      Discharge Placement                         Discharge Plan and Services Additional resources added to the After Visit Summary for                                       Social Determinants of Health (SDOH) Interventions SDOH Screenings   Food Insecurity: No Food Insecurity (02/15/2023)  Housing: Low Risk  (02/15/2023)  Transportation Needs: No Transportation Needs (02/15/2023)  Utilities: Not At Risk (02/15/2023)  Alcohol Screen: Low Risk  (11/12/2018)  Depression (PHQ2-9): Low Risk  (08/17/2020)  Tobacco Use: Low Risk  (02/16/2023)     Readmission Risk Interventions     No data to display

## 2023-02-20 ENCOUNTER — Encounter (HOSPITAL_COMMUNITY): Payer: Self-pay | Admitting: Emergency Medicine

## 2023-02-20 ENCOUNTER — Emergency Department (HOSPITAL_COMMUNITY)
Admission: EM | Admit: 2023-02-20 | Discharge: 2023-02-21 | Disposition: A | Discharge status: Home / Self Care | Payer: Worker's Compensation | Attending: Emergency Medicine | Admitting: Emergency Medicine

## 2023-02-20 ENCOUNTER — Emergency Department (HOSPITAL_COMMUNITY): Payer: Worker's Compensation

## 2023-02-20 ENCOUNTER — Other Ambulatory Visit: Payer: Self-pay

## 2023-02-20 ENCOUNTER — Ambulatory Visit (HOSPITAL_COMMUNITY)
Admission: EM | Admit: 2023-02-20 | Discharge: 2023-02-20 | Disposition: A | Discharge status: Home / Self Care | Payer: Worker's Compensation

## 2023-02-20 DIAGNOSIS — M5441 Lumbago with sciatica, right side: Secondary | ICD-10-CM | POA: Insufficient documentation

## 2023-02-20 DIAGNOSIS — M545 Low back pain, unspecified: Secondary | ICD-10-CM

## 2023-02-20 DIAGNOSIS — M5442 Lumbago with sciatica, left side: Secondary | ICD-10-CM | POA: Diagnosis not present

## 2023-02-20 DIAGNOSIS — E119 Type 2 diabetes mellitus without complications: Secondary | ICD-10-CM | POA: Insufficient documentation

## 2023-02-20 DIAGNOSIS — R519 Headache, unspecified: Secondary | ICD-10-CM | POA: Diagnosis not present

## 2023-02-20 DIAGNOSIS — W11XXXD Fall on and from ladder, subsequent encounter: Secondary | ICD-10-CM | POA: Diagnosis not present

## 2023-02-20 DIAGNOSIS — S065XAS Traumatic subdural hemorrhage with loss of consciousness status unknown, sequela: Secondary | ICD-10-CM

## 2023-02-20 DIAGNOSIS — Z7984 Long term (current) use of oral hypoglycemic drugs: Secondary | ICD-10-CM | POA: Diagnosis not present

## 2023-02-20 DIAGNOSIS — I10 Essential (primary) hypertension: Secondary | ICD-10-CM | POA: Diagnosis not present

## 2023-02-20 DIAGNOSIS — Z79899 Other long term (current) drug therapy: Secondary | ICD-10-CM | POA: Diagnosis not present

## 2023-02-20 DIAGNOSIS — Z794 Long term (current) use of insulin: Secondary | ICD-10-CM | POA: Diagnosis not present

## 2023-02-20 DIAGNOSIS — R079 Chest pain, unspecified: Secondary | ICD-10-CM | POA: Insufficient documentation

## 2023-02-20 NOTE — ED Triage Notes (Signed)
Pt arrives to ED c/o back pain that radiates to legs. Pt reports falling off of 4 foot ladder on Sept 16th. Pt was seen at urgent care yesterday and referred here. Pt reports LOC when hitting ground with head strike, but not c/o head pain. No thinners reported

## 2023-02-20 NOTE — Discharge Instructions (Signed)
Please go to the emergency room for further evaluation and management as we discussed.

## 2023-02-20 NOTE — ED Triage Notes (Signed)
Had accident 9/16. Pt hit his head during accent   Pt presents with pain in back and legs. States pain has gotten much worse over last few days.

## 2023-02-20 NOTE — ED Notes (Signed)
Patient is being discharged from the Urgent Care and sent to the Emergency Department via POV . Per Dorann Ou, PA, patient is in need of higher level of care due to head injury. Patient is aware and verbalizes understanding of plan of care.  Vitals:   02/20/23 1847  BP: 119/71  Pulse: 99  Resp: 18  Temp: 97.9 F (36.6 C)  SpO2: 94%

## 2023-02-20 NOTE — ED Provider Notes (Signed)
[facility]    CSN: [id number] Arrival date & time: 02/20/23  1710      History   Chief Complaint Chief Complaint  Patient presents with   Back Pain    HPI Alexander Parker is a 62 y.o. male.   Patient presents today with a 10-day history of lower back pain.  Reports that on 02/11/2023 he was three quarters of the way up with a 6 foot ladder when he fell backwards landing on his back.  He was seen in the emergency room and diagnosed with subdermal hemorrhage that did not require neurosurgical intervention.  He was discharged home on 02/17/2023 with ibuprofen, Tylenol, baclofen.  At that time he had cervical and head CT but did not have any imaging of his thoracic or lumbar spine.  He reports that the pain is significantly worsened he was unable to sleep last night as result of the pain.  It is rated 10 on a 0 to be scale, described as pressure with periodic sharp pains, radiating into bilateral legs but worse on the right, no alleviating factors identified.  He denies any bowel/bladder incontinence; he does struggle with constipation but has had a bowel movement since returning home from the hospital.  Denies any urinary or bowel retention.  Denies saddle anesthesia or lower extremity weakness.  Denies previous injury or surgery involving his back.    Past Medical History:  Diagnosis Date   Diabetes mellitus without complication (HCC)    Hyperlipidemia    Hypertension     Patient Active Problem List   Diagnosis Date Noted   TBI (traumatic brain injury) (HCC) 02/16/2023   Subdural hematoma caused by concussion (HCC) 02/15/2023   Uncontrolled type 2 diabetes mellitus with hyperglycemia (HCC) 07/04/2018   Pure hypercholesterolemia 06/23/2018   HTN (hypertension) 12/23/2011   Diabetes mellitus (HCC) 07/29/2011    Past Surgical History:  Procedure Laterality Date   CIRCUMCISION         Home Medications    Prior to Admission medications   Medication Sig Start Date  End Date Taking? Authorizing Provider  ACCU-CHEK GUIDE test strip USE UP TO 3 TIMES DAILY AS DIRECTED 07/06/20   Peyton Najjar, MD  Accu-Chek Softclix Lancets lancets USE UP TO 3 TIMES DAILY AS DIRECTED 07/06/20   Peyton Najjar, MD  acetaminophen (TYLENOL) 500 MG tablet Take 2 tablets (1,000 mg total) by mouth 3 (three) times daily. 02/17/23   Marrianne Mood, MD  atorvastatin (LIPITOR) 40 MG tablet TAKE ONE TABLET BY MOUTH ONCE DAILY FOR CHOLESTEROL 05/26/20   Just, Azalee Course, FNP  baclofen (LIORESAL) 10 MG tablet Take 1 tablet (10 mg total) by mouth 3 (three) times daily. 02/17/23   Marrianne Mood, MD  blood glucose meter kit and supplies KIT Per insurance preference. Use up to three times daily as directed. Dx E11.65, Z79.8. 05/26/20   Just, Azalee Course, FNP  ibuprofen (ADVIL) 800 MG tablet Take 1 tablet (800 mg total) by mouth every 8 (eight) hours as needed. 02/17/23   Marrianne Mood, MD  Insulin Pen Needle (PEN NEEDLES) 32G X 6 MM MISC 1 each by Does not apply route daily. Patient not taking: Reported on 03/17/2020 06/23/18   Lezlie Lye, Meda Coffee, MD  JARDIANCE 25 MG TABS tablet Take 25 mg by mouth daily.    [provider]  lisinopril (ZESTRIL) 20 MG tablet Take 1 tablet by mouth once daily 07/06/20   Peyton Najjar, MD  metFORMIN (GLUCOPHAGE) 1000 MG  tablet TAKE 1 TABLET BY MOUTH TWICE DAILY WITH A MEAL Patient taking differently: Take 1,000 mg by mouth 2 (two) times daily with a meal. 03/01/20   Lezlie Lye, Meda Coffee, MD  tamsulosin (FLOMAX) 0.4 MG CAPS capsule Take 1 capsule (0.4 mg total) by mouth daily. 08/17/20   Janeece Agee, NP  Vibegron (GEMTESA) 75 MG TABS Take 1 tablet by mouth daily.    [provider]    Family History Family History  Problem Relation Age of Onset   Diabetes Father    Esophageal cancer Maternal Uncle    Colon cancer Neg Hx    Colon polyps Neg Hx    Rectal cancer Neg Hx    Stomach cancer Neg Hx     Social History Social History    Tobacco Use   Smoking status: Never   Smokeless tobacco: Never  Vaping Use   Vaping status: Never Used  Substance Use Topics   Alcohol use: No    Alcohol/week: 0.0 standard drinks of alcohol   Drug use: No     Allergies   Patient has no known allergies.   Review of Systems Review of Systems  Constitutional:  Positive for activity change. Negative for appetite change, fatigue and fever.  Gastrointestinal:  Negative for nausea and vomiting.  Genitourinary:  Negative for dysuria, frequency, hematuria and urgency.  Musculoskeletal:  Positive for back pain. Negative for arthralgias and myalgias.  Neurological:  Negative for weakness and numbness.     Physical Exam Triage Vital Signs ED Triage Vitals  Encounter Vitals Group     BP 02/20/23 1847 119/71     Systolic BP Percentile --      Diastolic BP Percentile --      Pulse Rate 02/20/23 1847 99     Resp 02/20/23 1847 18     Temp 02/20/23 1847 97.9 F (36.6 C)     Temp Source 02/20/23 1847 Oral     SpO2 02/20/23 1847 94 %     Weight --      Height --      Head Circumference --      Peak Flow --      Pain Score 02/20/23 1844 9     Pain Loc --      Pain Education --      Exclude from Growth Chart --    No data found.  Updated Vital Signs BP 119/71 (BP Location: Right Arm)   Pulse 99   Temp 97.9 F (36.6 C) (Oral)   Resp 18   SpO2 94%   Visual Acuity Right Eye Distance:   Left Eye Distance:   Bilateral Distance:    Right Eye Near:   Left Eye Near:    Bilateral Near:     Physical Exam Vitals reviewed.  Constitutional:      General: He is awake.     Appearance: Normal appearance. He is well-developed. He is ill-appearing.     Comments: Very pleasant male appears stated age obviously uncomfortable sitting braced in a wheelchair  HENT:     Head: Normocephalic and atraumatic.     Mouth/Throat:     Pharynx: Uvula midline. No oropharyngeal exudate or posterior oropharyngeal erythema.  Cardiovascular:      Rate and Rhythm: Normal rate and regular rhythm.     Heart sounds: Normal heart sounds, S1 normal and S2 normal. No murmur heard. Pulmonary:     Effort: Pulmonary effort is normal.     Breath sounds:  Normal breath sounds. No stridor. No wheezing, rhonchi or rales.     Comments: Clear auscultation bilaterally Abdominal:     General: Bowel sounds are normal.     Palpations: Abdomen is soft.     Tenderness: There is no abdominal tenderness.  Musculoskeletal:     Cervical back: Tenderness present. No bony tenderness.     Thoracic back: Tenderness present. No bony tenderness.     Lumbar back: Tenderness and bony tenderness present.     Comments: Tenderness palpation throughout paraspinal muscles but worse in right lumbar region.  Tender to palpation over right SI joint and lumbar vertebrae.  No deformity or step-off noted.  Strength 5/5 bilateral upper and lower extremities.  Neurological:     Mental Status: He is alert.  Psychiatric:        Behavior: Behavior is cooperative.      UC Treatments / Results  Labs (all labs ordered are listed, but only abnormal results are displayed) Labs Reviewed - No data to display  EKG   Radiology No results found.  Procedures Procedures (including critical care time)  Medications Ordered in UC Medications - No data to display  Initial Impression / Assessment and Plan / UC Course  I have reviewed the triage vital signs and the nursing notes.  Pertinent labs & imaging results that were available during my care of the patient were reviewed by me and considered in my medical decision making (see chart for details).     Patient is afebrile and nontoxic.  Patient did not have any imaging of thoracic or lumbar spine during his initial hospitalization I am concerned for an additional injury.  Given severity of pain I recommended that he go to the emergency room for further evaluation and management since we do not have advanced imaging  capabilities in urgent care.  Family is agreeable and will take him directly to [facility], ER.  Vital signs were stable at time of discharge.  Final Clinical Impressions(s) / UC Diagnoses   Final diagnoses:  Severe low back pain  Fall from ladder, subsequent encounter  Traumatic subdural hemorrhage with unknown loss of consciousness status, sequela [facility])     Discharge Instructions      Please go to the emergency room for further evaluation and management as we discussed.     ED Prescriptions   None    PDMP not reviewed this encounter.   Jeani Hawking, PA-C 02/20/23 1936

## 2023-02-21 ENCOUNTER — Emergency Department (HOSPITAL_COMMUNITY): Payer: BC Managed Care – PPO

## 2023-02-21 ENCOUNTER — Other Ambulatory Visit: Payer: Self-pay

## 2023-02-21 LAB — BASIC METABOLIC PANEL
Anion gap: 11 (ref 5–15)
BUN: 16 mg/dL (ref 8–23)
CO2: 23 mmol/L (ref 22–32)
Calcium: 9.2 mg/dL (ref 8.9–10.3)
Chloride: 98 mmol/L (ref 98–111)
Creatinine, Ser: 0.7 mg/dL (ref 0.61–1.24)
GFR, Estimated: >60 mL/min (ref >60)
Glucose, Bld: 109 mg/dL — ABNORMAL HIGH (ref 70–99)
Potassium: 4.2 mmol/L (ref 3.5–5.1)
Sodium: 132 mmol/L — ABNORMAL LOW (ref 135–145)

## 2023-02-21 LAB — CBC
HCT: 42.9 % (ref 39.0–52.0)
Hemoglobin: 14.5 g/dL (ref 13.0–17.0)
MCH: 29.7 pg (ref 26.0–34.0)
MCHC: 33.8 g/dL (ref 30.0–36.0)
MCV: 87.9 fL (ref 80.0–100.0)
Platelets: 238 10*3/uL (ref 150–400)
RBC: 4.88 MIL/uL (ref 4.22–5.81)
RDW: 12.8 % (ref 11.5–15.5)
WBC: 5.3 10*3/uL (ref 4.0–10.5)
nRBC: 0 % (ref 0.0–0.2)

## 2023-02-21 LAB — TROPONIN I (HIGH SENSITIVITY): Troponin I (High Sensitivity): <2 ng/L (ref <18)

## 2023-02-21 MED ORDER — MELOXICAM 15 MG PO TABS: 15.0000 mg | ORAL_TABLET | Freq: Every day | ORAL | 0 refills | Status: AC

## 2023-02-21 MED ORDER — OXYCODONE HCL 5 MG PO TABS
5.0000 mg | ORAL_TABLET | Freq: Once | ORAL | Status: AC
  Administered 2023-02-21: 5 mg via ORAL
  Filled 2023-02-21: qty 1

## 2023-02-21 MED ORDER — METHOCARBAMOL 500 MG PO TABS: 500.0000 mg | ORAL_TABLET | Freq: Two times a day (BID) | ORAL | 0 refills | Status: DC

## 2023-02-21 MED ORDER — DIAZEPAM 5 MG PO TABS
5.0000 mg | ORAL_TABLET | Freq: Once | ORAL | Status: AC
  Administered 2023-02-21: 5 mg via ORAL
  Filled 2023-02-21: qty 1

## 2023-02-21 NOTE — ED Notes (Signed)
Pt provided with AVS.  Education complete; all questions answered using language interpreter service with family at bedside.  Pt and family provided with number for Medical Records to obtain copy of imaging.  Prescriptions discussed with pt.  Pt leaving ED in stable condition at this time via wheelchair with all belongings.  Pt taken to ED entrance by this RN and discharged with family.

## 2023-02-21 NOTE — ED Provider Notes (Signed)
[place] [facility] AT [facility] Provider Note   CSN: [id number] Arrival date & time: 02/20/23  1931     History  Chief Complaint  Patient presents with   Back Pain    Alexander Parker is a 62 y.o. male.  62 year old male presents with complaint of lower back pain. Patient fell when he was 1/2 way up a 6 foot ladder on 02/11/23. Patient came to the ER a few days later with head pain and was found to have a small subdural and subarachnoid bleed, not requiring intervention and ultimately discharged home with a walker. Patient notes low back pain radiates down both legs to feet, constant, not any worse with walking, pain not controlled with motrin and tylenol at home. Denies abdominal pain, saddle paresthesias, bowel or bladder incontinence. Low back pain started while he was in the hospital but he thought was related to being in bed while here. No other complaints today.        Home Medications Prior to Admission medications   Medication Sig Start Date End Date Taking? Authorizing Provider  meloxicam (MOBIC) 15 MG tablet Take 1 tablet (15 mg total) by mouth daily for 10 days. 02/21/23 03/03/23 Yes Jeannie Fend, PA-C  methocarbamol (ROBAXIN) 500 MG tablet Take 1 tablet (500 mg total) by mouth 2 (two) times daily. 02/21/23  Yes Jeannie Fend, PA-C  ACCU-CHEK GUIDE test strip USE UP TO 3 TIMES DAILY AS DIRECTED 07/06/20   Peyton Najjar, MD  Accu-Chek Softclix Lancets lancets USE UP TO 3 TIMES DAILY AS DIRECTED 07/06/20   Peyton Najjar, MD  acetaminophen (TYLENOL) 500 MG tablet Take 2 tablets (1,000 mg total) by mouth 3 (three) times daily. 02/17/23   Marrianne Mood, MD  atorvastatin (LIPITOR) 40 MG tablet TAKE ONE TABLET BY MOUTH ONCE DAILY FOR CHOLESTEROL 05/26/20   Just, Azalee Course, FNP  blood glucose meter kit and supplies KIT Per insurance preference. Use up to three times daily as directed. Dx E11.65, Z79.8. 05/26/20   Just, Azalee Course, FNP  Insulin Pen Needle  (PEN NEEDLES) 32G X 6 MM MISC 1 each by Does not apply route daily. Patient not taking: Reported on 03/17/2020 06/23/18   Lezlie Lye, Meda Coffee, MD  JARDIANCE 25 MG TABS tablet Take 25 mg by mouth daily.    [provider]  lisinopril (ZESTRIL) 20 MG tablet Take 1 tablet by mouth once daily 07/06/20   Peyton Najjar, MD  metFORMIN (GLUCOPHAGE) 1000 MG tablet TAKE 1 TABLET BY MOUTH TWICE DAILY WITH A MEAL Patient taking differently: Take 1,000 mg by mouth 2 (two) times daily with a meal. 03/01/20   Lezlie Lye, Meda Coffee, MD  tamsulosin (FLOMAX) 0.4 MG CAPS capsule Take 1 capsule (0.4 mg total) by mouth daily. 08/17/20   Janeece Agee, NP  Vibegron (GEMTESA) 75 MG TABS Take 1 tablet by mouth daily.    [provider]      Allergies    Patient has no known allergies.    Review of Systems   Review of Systems Negative except as per HPI Physical Exam Updated Vital Signs BP 116/69   Pulse 70   Temp 97.9 F (36.6 C) (Oral)   Resp 12   Ht 5\' 3"  (1.6 m)   Wt 74.4 kg   SpO2 95%   BMI 29.05 kg/m  Physical Exam Vitals and nursing note reviewed.  Constitutional:      General: He is not in  acute distress.    Appearance: He is well-developed. He is not diaphoretic.  HENT:     Head: Normocephalic and atraumatic.  Cardiovascular:     Pulses: Normal pulses.  Pulmonary:     Effort: Pulmonary effort is normal.  Musculoskeletal:     Thoracic back: No tenderness or bony tenderness.     Lumbar back: Tenderness and bony tenderness present.       Back:  Skin:    General: Skin is warm and dry.     Findings: No erythema or rash.  Neurological:     Mental Status: He is alert and oriented to person, place, and time.     Sensory: No sensory deficit.     Motor: No weakness.     Deep Tendon Reflexes: Babinski sign absent on the right side. Babinski sign absent on the left side.     Reflex Scores:      Patellar reflexes are 2+ on the right side and 2+ on the left side.       Achilles reflexes are 1+ on the right side and 1+ on the left side. Psychiatric:        Behavior: Behavior normal.     ED Results / Procedures / Treatments   Labs (all labs ordered are listed, but only abnormal results are displayed) Labs Reviewed  BASIC METABOLIC PANEL - Abnormal; Notable for the following components:      Result Value   Sodium 132 (*)    Glucose, Bld 109 (*)    All other components within normal limits  CBC  TROPONIN I (HIGH SENSITIVITY)    EKG None  Radiology DG Chest Port 1 View  Result Date: 02/21/2023 CLINICAL DATA:  Chest pain. EXAM: PORTABLE CHEST 1 VIEW COMPARISON:  02/11/2023. FINDINGS: The heart size and mediastinal contours are within normal limits. No consolidation, effusion, or pneumothorax. No acute osseous abnormality. IMPRESSION: No active disease. Electronically Signed   By: Thornell Sartorius M.D.   On: 02/21/2023 04:53   CT Lumbar Spine Wo Contrast  Result Date: 02/21/2023 CLINICAL DATA:  Back trauma, no prior imaging. Back pain radiating to legs. Fall from 4 foot ladder on September 16th. EXAM: CT LUMBAR SPINE WITHOUT CONTRAST TECHNIQUE: Multidetector CT imaging of the lumbar spine was performed without intravenous contrast administration. Multiplanar CT image reconstructions were also generated. RADIATION DOSE REDUCTION: This exam was performed according to the departmental dose-optimization program which includes automated exposure control, adjustment of the mA and/or kV according to patient size and/or use of iterative reconstruction technique. COMPARISON:  None Available. FINDINGS: Segmentation: 5 lumbar type vertebrae. Alignment: There is mild anterolisthesis at L5-S1. Vertebrae: No acute fracture or focal pathologic process. Paraspinal and other soft tissues: No acute abnormality. Aortic atherosclerosis. Disc levels: L1-L2: There are degenerative endplate changes and facet arthropathy with no significant spinal canal or neural foraminal stenosis.  L2-L3: There is disc bulge with degenerative endplate changes and facet arthropathy. No significant spinal canal or neural foraminal stenosis. L3-L4: There is a disc bulge with degenerative endplate changes and facet arthropathy with no significant spinal canal or neural foraminal stenosis. L4-L5: There is bulging of the disc with degenerative endplate changes and facet arthropathy resulting in mild spinal canal stenosis. The neural foramina are within normal limits. L5-S1: There is a disc bulge with degenerative endplate changes and mild facet arthropathy. No significant spinal canal or neural foraminal stenosis. IMPRESSION: 1. No acute fracture. 2. Multilevel degenerative disc disease, degenerative endplate changes, and facet arthropathy. Electronically  Signed   By: Thornell Sartorius M.D.   On: 02/21/2023 02:48   DG Cervical Spine Complete  Result Date: 02/20/2023 CLINICAL DATA:  Fall from 6 foot ladder EXAM: CERVICAL SPINE - COMPLETE 5 VIEW COMPARISON:  Cervical spine radiograph dated 02/12/2023 FINDINGS: There is no evidence of cervical spine fracture or prevertebral soft tissue swelling. Alignment is normal. Multilevel degenerative changes of the cervical spine characterized by anterior disc osteophytes. Mild right neural foraminal narrowing at C2-3 and C3-4. IMPRESSION: 1. No acute fracture or traumatic listhesis. 2. Multilevel degenerative changes of the cervical spine. Electronically Signed   By: Agustin Cree M.D.   On: 02/20/2023 20:55    Procedures Procedures    Medications Ordered in ED Medications  diazepam (VALIUM) tablet 5 mg (5 mg Oral Given 02/21/23 0129)  oxyCODONE (Oxy IR/ROXICODONE) immediate release tablet 5 mg (5 mg Oral Given 02/21/23 0129)    ED Course/ Medical Decision Making/ A&P                                 Medical Decision Making Amount and/or Complexity of Data Reviewed Labs: ordered. Radiology: ordered.  Risk Prescription drug management.   This patient presents to  the ED for concern of back pain, this involves an extensive number of treatment options, and is a complaint that carries with it a high risk of complications and morbidity.  The differential diagnosis includes but not limited to compression fracture, herniated disc, nerve impingement    Co morbidities that complicate the patient evaluation  DM, HTN, HLD, recent admission for subdural/SAH   Additional history obtained:  Additional history obtained from family at bedside who contributes to history as above External records from outside source obtained and reviewed including discharge summary dated 02/17/2023.   Lab Tests:  I Ordered, and personally interpreted labs.  The pertinent results include: Troponin is less than 2.  This is obtained 8 hours after the last time patient had any discomfort in his chest.  CBC is within normal limits.  BMP is without significant findings.   Imaging Studies ordered:  I ordered imaging studies including CT lumbar spine I independently visualized and interpreted imaging which showed no acute injury I agree with the radiologist interpretation, arthritic changes, no acute process. X-ray of C-spine obtained in triage with findings per radiology report.   Cardiac Monitoring: / EKG:  The patient was maintained on a cardiac monitor.  I personally viewed and interpreted the cardiac monitored which showed an underlying rhythm of: Sinus rhythm, rate 70   Consultations Obtained:  I requested consultation with the ER attending, Dr. Preston Fleeting,  and discussed lab and imaging findings as well as pertinent plan - they recommend: agrees with plan of care   Problem List / ED Course / Critical interventions / Medication management  62 year old male presents with complaint of low back pain which radiates down his legs since falling 10 days ago.  Patient is found to have pain across his low back as well as midline, strength and sensation are symmetric, reflexes intact.   No red flag symptoms.  CT lumbar spine obtained and is negative for traumatic findings.  Pain has improved with Valium and oxycodone.  Plan was for discharge with recommendation to discontinue his ibuprofen and baclofen and will try different muscle relaxant and anti-inflammatory however with discharge planning, patient's family inquires if his chest pain is related to his fall.  This is the  first patient or family have mentioned concerns regarding chest pain.  Patient states that he has had midsternal chest pain off and on since his fall.  His last episode of pain was prior to coming to the ER tonight 8 hours ago.  Advised patient it is not clear if this is related to his injury, concern regarding potential for exertional chest pain as patient has been walking with a walker.  Plan is to obtain chest pain workup, patient agrees with this plan. Chest pain workup is unrevealing.  Troponin was obtained 8 hours after patient last experienced any discomfort in his chest and is negative.  Patient is provided with ambulatory referral to cardiology due to reports of midsternal chest pain, question exertional component now that he is ambulatory with a walker. I ordered medication including Percocet, oxycodone for pain Reevaluation of the patient after these medicines showed that the patient resolved I have reviewed the patients home medicines and have made adjustments as needed   Social Determinants of Health:  Lives with family   Test / Admission - Considered:  Stable for discharge with referral to neurosurgery, cardiology and plan to recheck with PCP with return to ER precautions.         Final Clinical Impression(s) / ED Diagnoses Final diagnoses:  Chest pain, unspecified type  Acute midline low back pain with bilateral sciatica    Rx / DC Orders ED Discharge Orders          Ordered    meloxicam (MOBIC) 15 MG tablet  Daily        02/21/23 0523    methocarbamol (ROBAXIN) 500 MG tablet  2  times daily        02/21/23 0523    Ambulatory referral to Cardiology        02/21/23 0526              Jeannie Fend, PA-C 02/21/23 0530    Dione Booze, MD 02/21/23 [phone]

## 2023-02-21 NOTE — Discharge Instructions (Addendum)
Haga un seguimiento con su proveedor de Marine scientist. Llame a neurociruga para programar un seguimiento.  Regrese a la sala de emergencias si los sntomas empeoran o son preocupantes.   DEJE de tomar Baclofeno y Motrin (Ibuprofeno). Tome Meloxicam diariamente segn lo recetado para Chief Technology Officer. Tome Robaxin segn lo prescrito para Chief Technology Officer, no conduzca ni opere maquinaria si est tomando [employer].  Follow up with your primary care provider. Please call neurosurgery to schedule follow up.  Return to the ER for worsening or concerning symptoms.   STOP the Baclofen and Motrin (Ibuprofen). Take Meloxicam daily as prescribed for pain. Take Robaxin as prescribed for pain, do not drive or operate machinery if taking this medication.

## 2023-03-07 ENCOUNTER — Ambulatory Visit: Payer: Worker's Compensation | Admitting: Internal Medicine

## 2023-03-07 VITALS — BP 111/56 | HR 91 | Temp 97.8°F | Ht 63.0 in | Wt 139.7 lb

## 2023-03-07 DIAGNOSIS — S069XAD Unspecified intracranial injury with loss of consciousness status unknown, subsequent encounter: Secondary | ICD-10-CM

## 2023-03-07 MED ORDER — METHOCARBAMOL 500 MG PO TABS: 500.0000 mg | ORAL_TABLET | Freq: Two times a day (BID) | ORAL | 0 refills | Status: AC

## 2023-03-07 NOTE — Patient Instructions (Signed)
Gracias, Mr.Dagmawi Felipa Furnace por permitirnos brindarle atencin hoy. hoy discutimos  Lamento que sigas teniendo dolor de [place] y de espalda. Sigue tomando Tylenol, Robaxin e ibuprofeno segn sea necesario.  Puedes comprar una almohadilla trmica en tu farmacia local para el dolor de espalda y puedes seguir usando esos parches analgsicos de venta libre.  No deberas volver a trabajar al menos hasta que hayas terminado la rehabilitacin vestibular y Elaina Hoops que te sientas mejor. Deberas volver en aproximadamente 2 semanas y volveremos a evaluar tus sntomas.  He ordenado los siguientes laboratorios para usted:  Lab Orders  No laboratory test(s) ordered today     Pruebas ordenadas hoy:  Referencias ordenadas hoy:  Referral Orders  No referral(s) requested today     He ordenado el siguiente medicamento/cambiado los siguientes medicamentos:  Suspender los siguientes medicamentos: Medications Discontinued During This Encounter  Medication Reason   methocarbamol (ROBAXIN) 500 MG tablet Reorder     Iniciar los siguientes medicamentos: Meds ordered this encounter  Medications   methocarbamol (ROBAXIN) 500 MG tablet    Sig: Take 1 tablet (500 mg total) by mouth 2 (two) times daily.    Dispense:  20 tablet    Refill:  0     Seguimiento  2 weeks      Si tiene [place] pregunta o inquietud, llame a la clnica de medicina interna al [phone].     Elza Rafter, D.O. [facility]

## 2023-03-07 NOTE — Progress Notes (Signed)
CC: hospital follow up  HPI:  [name] is a 62 y.o. male living with a history stated below and presents today for a hospital follow up after being hospitalized from 9/20-9/22 for traumatic brain injury. Visit was conducted with the assistance of a video interpretor. Please see problem based assessment and plan for additional details.  Past Medical History:  Diagnosis Date   Diabetes mellitus without complication (HCC)    Hyperlipidemia    Hypertension     Current Outpatient Medications on File Prior to Visit  Medication Sig Dispense Refill   ACCU-CHEK GUIDE test strip USE UP TO 3 TIMES DAILY AS DIRECTED 100 each 0   Accu-Chek Softclix Lancets lancets USE UP TO 3 TIMES DAILY AS DIRECTED 100 each 0   acetaminophen (TYLENOL) 500 MG tablet Take 2 tablets (1,000 mg total) by mouth 3 (three) times daily.     atorvastatin (LIPITOR) 40 MG tablet TAKE ONE TABLET BY MOUTH ONCE DAILY FOR CHOLESTEROL 90 tablet 3   blood glucose meter kit and supplies KIT Per insurance preference. Use up to three times daily as directed. Dx E11.65, Z79.8. 1 each 0   Insulin Pen Needle (PEN NEEDLES) 32G X 6 MM MISC 1 each by Does not apply route daily. (Patient not taking: Reported on 03/17/2020) 100 each 5   JARDIANCE 25 MG TABS tablet Take 25 mg by mouth daily.     lisinopril (ZESTRIL) 20 MG tablet Take 1 tablet by mouth once daily 90 tablet 0   metFORMIN (GLUCOPHAGE) 1000 MG tablet TAKE 1 TABLET BY MOUTH TWICE DAILY WITH A MEAL (Patient taking differently: Take 1,000 mg by mouth 2 (two) times daily with a meal.) 180 tablet 1   tamsulosin (FLOMAX) 0.4 MG CAPS capsule Take 1 capsule (0.4 mg total) by mouth daily. 30 capsule 3   Vibegron (GEMTESA) 75 MG TABS Take 1 tablet by mouth daily.     No current facility-administered medications on file prior to visit.    Family History  Problem Relation Age of Onset   Diabetes Father    Esophageal cancer Maternal Uncle    Colon cancer Neg Hx    Colon polyps  Neg Hx    Rectal cancer Neg Hx    Stomach cancer Neg Hx     Social History   Socioeconomic History   Marital status: Married    Spouse name: Not on file   Number of children: Not on file   Years of education: Not on file   Highest education level: Not on file  Occupational History   Not on file  Tobacco Use   Smoking status: Never   Smokeless tobacco: Never  Vaping Use   Vaping status: Never Used  Substance and Sexual Activity   Alcohol use: No    Alcohol/week: 0.0 standard drinks of alcohol   Drug use: No   Sexual activity: Yes  Other Topics Concern   Not on file  Social History Narrative   Not on file   Social Determinants of Health   Financial Resource Strain: Not on file  Food Insecurity: No Food Insecurity (02/15/2023)   Hunger Vital Sign    Worried About Running Out of Food in the Last Year: Never true    Ran Out of Food in the Last Year: Never true  Transportation Needs: No Transportation Needs (02/15/2023)   PRAPARE - Administrator, Civil Service (Medical): No    Lack of Transportation (Non-Medical): No  Physical Activity: Not  on file  Stress: Not on file  Social Connections: Not on file  Intimate Partner Violence: Not At Risk (02/15/2023)   Humiliation, Afraid, Rape, and Kick questionnaire    Fear of Current or Ex-Partner: No    Emotionally Abused: No    Physically Abused: No    Sexually Abused: No    Review of Systems: ROS negative except for what is noted on the assessment and plan.  Vitals:   03/07/23 1548  BP: (!) 111/56  Pulse: 91  Temp: 97.8 F (36.6 C)  TempSrc: Oral  SpO2: 97%  Weight: 139 lb 11.2 oz (63.4 kg)  Height: 5\' 3"  (1.6 m)    Physical Exam: Constitutional: appears comfortable, in no acute distress Cardiovascular: regular rate and rhythm Pulmonary/Chest: normal work of breathing on room air MSK: normal bulk and tone Neurological: alert & oriented x 3, CN II-XII intact, 5/5 strength bilateral upper and lower  extremities, normal finger to nose bilaterally, speech is normal Skin: warm and dry Psych: normal mood and behavior  Assessment & Plan:   Patient discussed with Dr. Criselda Peaches  TBI (traumatic brain injury) [facility]) The patient was initially seen in the ED on 9/16 after falling off of a ladder. He hit his head and had LOC - CT scan at the time showed scattered subarachnoid hemorrhage along the right Sylvian fissure, bilateral subfrontal sulci, and anterior falx.He was discharged in stable condition after being cleared by neurosurgery. On 9/20, the patient came back to the hospital due to worsening symptoms and on re-evaluation/re-imaging, CT showed "compared to prior exam there is a new 7 mm low-density subdural fluid collection along the left cerebral convexity, which may represent a chronic subdural hematoma or hygroma". He was admitted to our service for observation and management of his pain. He was treated with supportive therapy including Tylenol and NSAIDs with muscle relaxer for tension type features of his headache and ondansetron for nausea. He was discharged in good condition to continue Tylenol, ibuprofen, and baclofen.  Today, the patient reports that he is overall improved compared to when he was in the hospital, but he is still having headaches along with some nausea/vomiting. His n/v has improved, but he still reports having episodes of vomiting 1-2 times per week. He has been taking Robaxin, ibuprofen, and Tylenol as needed. He also continues to have midline low back pain that improves with heat and robaxin.   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 His TBI was severe and it will likely take a few weeks for him to get back to his previous baseline. He is planning to start vestibular rehab on 10/14, which I think will be very beneficial for him. I also  advised the patient to continue supportive care with Robaxin, ibuprofen, tylenol, heating pads, and lidocaine patches. We will follow up with him again in a few weeks after he finishes vestibular rehab to re-assess whether or not it is safe for him to return to work.    Elza Rafter, D.O. [facility] Internal Medicine, PGY-3 Phone: [phone] Date 03/08/2023 Time 8:47 AM

## 2023-03-07 NOTE — Progress Notes (Deleted)
   Established Patient Office Visit  Subjective   Patient ID: Alexander Parker, male    DOB: 02/17/1961  Age: 62 y.o. MRN: [id number]  No chief complaint on file.   HPI  {History (Optional):23778}  ROS    Objective:     There were no vitals taken for this visit. {Vitals History (Optional):23777}  Physical Exam   No results found for any visits on 03/07/23.  {Labs (Optional):23779}  The ASCVD Risk score (Arnett DK, et al., 2019) failed to calculate for the following reasons:   The valid total cholesterol range is 130 to 320 mg/dL    Assessment & Plan:  TBI Patient was admitted to hospital on 02/15/2023 for traumatic brain injury.  Patient had presented with worsening headache and vomiting for 4 days after falling off a ladder and hitting his head resulting in loss in conscious.  He was seen in ED on 02/11/2023 was found to have small subarachnoid hemorrhage that was stable and was discharged after seen by her neurosurgical.  However he came back on 02/15/2023 and was admitted for worsening symptoms.  On reevaluation he was found to have interval development of small left subdural hematoma and he was treated with supportive therapy including Tylenol and NSAIDs. -Patient is currently on Tylenol 500 mg, 2 tablets by mouth 3 times daily -Baclofen 10 mg 3 times daily -Ibuprofen 800 mg every 8 hours as needed  Diabetes Lab Results  Component Value Date   HGBA1C 6.8 (H) 02/15/2023    Patient is currently on -Jardiance 25 mg daily, metformin 1000 mg twice daily,  Hypertension BP Readings from Last 3 Encounters:  02/21/23 116/66  02/20/23 119/71  02/17/23 121/87    Lisinopril 20 mg daily,  Hyperlipidemia Lab Results  Component Value Date   CHOL 114 08/17/2020   HDL 29 (L) 08/17/2020   LDLCALC 70 08/17/2020   TRIG 69 08/17/2020   CHOLHDL 3.9 08/17/2020    Atorvastatin 40 mg daily. Problem List Items Addressed This Visit   None   No follow-ups on file.    Jeral Pinch, DO

## 2023-03-08 NOTE — Assessment & Plan Note (Signed)
The patient was initially seen in the ED on 9/16 after falling off of a ladder. He hit his head and had LOC - CT scan at the time showed scattered subarachnoid hemorrhage along the right Sylvian fissure, bilateral subfrontal sulci, and anterior falx.He was discharged in stable condition after being cleared by neurosurgery. On 9/20, the patient came back to the hospital due to worsening symptoms and on re-evaluation/re-imaging, CT showed "compared to prior exam there is a new 7 mm low-density subdural fluid collection along the left cerebral convexity, which may represent a chronic subdural hematoma or hygroma". He was admitted to our service for observation and management of his pain. He was treated with supportive therapy including Tylenol and NSAIDs with muscle relaxer for tension type features of his headache and ondansetron for nausea. He was discharged in good condition to continue Tylenol, ibuprofen, and baclofen.  Today, the patient reports that he is overall improved compared to when he was in the hospital, but he is still having headaches along with some nausea/vomiting. His n/v has improved, but he still reports having episodes of vomiting 1-2 times per week. He has been taking Robaxin, ibuprofen, and Tylenol as needed. He also continues to have midline low back pain that improves with heat and robaxin.   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 His TBI was severe and it will likely take a few weeks for him to get back to his previous baseline. He is planning to start vestibular rehab on 10/14, which I think will be very beneficial for him. I also advised the patient to continue supportive care with Robaxin, ibuprofen, tylenol, heating pads, and lidocaine patches. We will follow up with him again in a few weeks after he finishes vestibular rehab to re-assess whether  or not it is safe for him to return to work.

## 2023-03-11 ENCOUNTER — Ambulatory Visit: Payer: Worker's Compensation | Attending: Student in an Organized Health Care Education/Training Program

## 2023-03-11 DIAGNOSIS — M6281 Muscle weakness (generalized): Secondary | ICD-10-CM | POA: Insufficient documentation

## 2023-03-11 DIAGNOSIS — S069X9D Unspecified intracranial injury with loss of consciousness of unspecified duration, subsequent encounter: Secondary | ICD-10-CM | POA: Diagnosis not present

## 2023-03-11 DIAGNOSIS — R2681 Unsteadiness on feet: Secondary | ICD-10-CM | POA: Insufficient documentation

## 2023-03-11 DIAGNOSIS — R42 Dizziness and giddiness: Secondary | ICD-10-CM | POA: Diagnosis present

## 2023-03-11 NOTE — Therapy (Signed)
OUTPATIENT PHYSICAL THERAPY VESTIBULAR EVALUATION     Patient Name: Alexander Parker MRN: [id number] DOB:Feb 19, 1961, 62 y.o., male Today's Date: 03/11/2023  END OF SESSION:  PT End of Session - 03/11/23 0957     Visit Number 1    Number of Visits 13    Date for PT Re-Evaluation 05/10/23    Authorization Type BCBS    PT Start Time 1016    PT Stop Time 1103    PT Time Calculation (min) 47 min    Activity Tolerance Patient tolerated treatment well;Patient limited by pain    Behavior During Therapy [facility] for tasks assessed/performed             Past Medical History:  Diagnosis Date   Diabetes mellitus without complication (HCC)    Hyperlipidemia    Hypertension    Past Surgical History:  Procedure Laterality Date   CIRCUMCISION     Patient Active Problem List   Diagnosis Date Noted   TBI (traumatic brain injury) (HCC) 02/16/2023   Subdural hematoma caused by concussion (HCC) 02/15/2023   Uncontrolled type 2 diabetes mellitus with hyperglycemia (HCC) 07/04/2018   Pure hypercholesterolemia 06/23/2018   HTN (hypertension) 12/23/2011   Diabetes mellitus (HCC) 07/29/2011    PCP: Barry Brunner, PA-C REFERRING PROVIDER: Tyson Alias, MD  REFERRING DIAG: [phone] (ICD-10-CM) - Traumatic brain injury with loss of consciousness, subsequent encounter  THERAPY DIAG:  Unsteadiness on feet - Plan: PT plan of care cert/re-cert  Dizziness and giddiness - Plan: PT plan of care cert/re-cert  Muscle weakness (generalized) - Plan: PT plan of care cert/re-cert  ONSET DATE: 02/17/2023 referral  Rationale for Evaluation and Treatment: Rehabilitation  SUBJECTIVE:   SUBJECTIVE STATEMENT: Patient arrives to clinic with family and interpretor, no AD. Reports feeling "so so." Initially reports no dizziness since doing exercises provided to him in the hospital, but opon further questioning, reports some dizziness with bed mobility. Does endorse some "fuzziness" in  memory/cognition, but not necessarily in dizziness.  Pt accompanied by: family member and interpreter:    PERTINENT HISTORY: DM, HLD, HTN, recent BI  PAIN:  Are you having pain? Yes: NPRS scale: 6/10 Pain location: head, back Pain description: achy Aggravating factors: laying flat on his back Relieving factors: pain rx  PRECAUTIONS: Fall   WEIGHT BEARING RESTRICTIONS: No  FALLS: Has patient fallen in last 6 months? Yes. Number of falls 1; MOI  PATIENT GOALS: "I want my pain and dizziness to get better"  OBJECTIVE:  Note: Objective measures were completed at Evaluation unless otherwise noted.  DIAGNOSTIC FINDINGS: Head CT 02/11/23 IMPRESSION: 1. Scattered subarachnoid hemorrhage along the right Sylvian fissure, bilateral subfrontal sulci, and anterior falx. 2. Probable trace interhemispheric subdural hemorrhage along the falx near the vertex. 3. Linear, nondisplaced fracture of the left occipital bone. 4. No acute cervical spine fracture or traumatic listhesis.   COGNITION: Overall cognitive status: Difficulty to assess due to: language barrier   Cervical ROM:   Generally limited with some increase in pain  STRENGTH: WFL  BED MOBILITY:  Independent    GAIT: Gait pattern: step through pattern and wide BOS Distance walked: clinic Assistive device utilized: None Level of assistance: SBA and CGA Comments: slow gait speed  PATIENT SURVEYS:  FOTO 50, expected to be at 60  VESTIBULAR ASSESSMENT:  GENERAL OBSERVATION: NAD, no AD   SYMPTOM BEHAVIOR:  Subjective history: see above  Non-Vestibular symptoms: neck pain and headaches  Type of dizziness: Spinning/Vertigo and "cloudy"  Frequency: only when laying  down-> sitting up   Duration: "not a lot"  Aggravating factors: Induced by position change: rolling to the right, rolling to the left, and supine to sit  Relieving factors: rest and slow movements  Progression of symptoms: better  OCULOMOTOR  EXAM:  Ocular Alignment: normal  Ocular ROM: No Limitations  Spontaneous Nystagmus: absent  Gaze-Induced Nystagmus: absent  Smooth Pursuits: saccades vertical  Saccades: extra eye movements and slow  Convergence/Divergence: <5 cm   VESTIBULAR - OCULAR REFLEX:   Slow VOR: Normal  VOR Cancellation: Unable to Maintain Gaze  Head-Impulse Test: HIT Right: positive HIT Left: unable to discern, significant cervical guarding  Dynamic Visual Acuity: to be assessed   POSITIONAL TESTING: Right Dix-Hallpike: no nystagmus Left Dix-Hallpike: no nystagmus Right Roll Test: no nystagmus Left Roll Test: no nystagmus  MOTION SENSITIVITY:  Motion Sensitivity Quotient Intensity: 0 = none, 1 = Lightheaded, 2 = Mild, 3 = Moderate, 4 = Severe, 5 = Vomiting  Intensity  1. Sitting to supine 3  2. Supine to L side   3. Supine to R side   4. Supine to sitting 3  5. L Hallpike-Dix 2  6. Up from L  2  7. R Hallpike-Dix 2  8. Up from R  2  9. Sitting, head tipped to L knee   10. Head up from L knee   11. Sitting, head tipped to R knee   12. Head up from R knee   13. Sitting head turns x5   14.Sitting head nods x5   15. In stance, 180 turn to L    16. In stance, 180 turn to R       VESTIBULAR TREATMENT:                                                                                                   N/a eval  PATIENT EDUCATION: Education details: PT POC, exam findings, continue VOR exercises from hospital Person educated: Patient and Spouse Education method: Medical illustrator Education comprehension: verbalized understanding  HOME EXERCISE PROGRAM: To be provided  GOALS: Goals reviewed with patient? Yes  SHORT TERM GOALS: Target date: 04/12/23  Pt will be independent with initial HEP for improved balance and symptoms  Baseline: to be provided Goal status: INITIAL  2.  Patient will demonstrate (-) positional testing to indicate resolution of BPPV  Baseline: to be  reassessed PRN Goal status: INITIAL  3.  MCTSIB goal Baseline: to be assessed Goal status: INITIAL  4.  MSQ goal Baseline: to be completed Goal status: INITIAL  5.  DVA goal Baseline: to be assessed Goal status: INITIAL   LONG TERM GOALS: Target date: 05/10/23  Pt will be independent with final HEP for improved balance and symptoms  Baseline: to be provided Goal status: INITIAL  Patient will demonstrate (-) positional testing to indicate resolution of BPPV  Baseline: to be reassessed PRN Goal status: INITIAL  MCTSIB goal Baseline: to be assessed Goal status: INITIAL  MSQ goal Baseline: to be completed Goal status: INITIAL  DVA goal Baseline: to be assessed Goal status: INITIAL  6. Patient will score >/= 60 on FOTO to demonstrate improved symptoms  Baseline: 50  Goal status: INITIAL  ASSESSMENT:  CLINICAL IMPRESSION: Patient is a 62 y.o. MALE who was seen today for physical therapy evaluation and treatment for dizziness s/p fall at work. Upon chart review, patient with probable BPPV in the hospital that has since resolved. He does have significantly impaired VOR cancellation and at least a positive R HIT. Cervical guarding complicating assessment of L HIT, though this is likely positive as well given symptom report. He would benefit from skilled PT services to address the above mentioned deficits.   OBJECTIVE IMPAIRMENTS: decreased activity tolerance, decreased balance, decreased knowledge of condition, dizziness, impaired vision/preception, and pain.   ACTIVITY LIMITATIONS: carrying, lifting, stairs, hygiene/grooming, locomotion level, and caring for others  PARTICIPATION LIMITATIONS: meal prep, cleaning, interpersonal relationship, driving, shopping, community activity, occupation, and yard work  PERSONAL FACTORS: Age, Fitness, Past/current experiences, Profession, Transportation, and 3+ comorbidities: see above  are also affecting patient's functional outcome.    REHAB POTENTIAL: Good  CLINICAL DECISION MAKING: Stable/uncomplicated  EVALUATION COMPLEXITY: Low   PLAN:  PT FREQUENCY: 2x/week  PT DURATION: 6 weeks  PLANNED INTERVENTIONS: 97146- PT Re-evaluation, 97110-Therapeutic exercises, 97530- Therapeutic activity, 97112- Neuromuscular re-education, 97535- Self Care, 82956- Manual therapy, [phone]- Gait training, [phone]- Orthotic Fit/training, [phone]- Canalith repositioning, [phone]- Aquatic Therapy, Patient/Family education, Balance training, Stair training, Dry Needling, Vestibular training, Visual/preceptual remediation/compensation, and DME instructions  PLAN FOR NEXT SESSION: HEP, finish MSQ, MCTSIB, DVA   Westley Foots, PT Westley Foots, PT, DPT, CBIS  03/11/2023, 11:21 AM

## 2023-03-13 ENCOUNTER — Ambulatory Visit: Payer: Worker's Compensation | Attending: Medical

## 2023-03-13 DIAGNOSIS — R2681 Unsteadiness on feet: Secondary | ICD-10-CM | POA: Diagnosis present

## 2023-03-13 DIAGNOSIS — R42 Dizziness and giddiness: Secondary | ICD-10-CM

## 2023-03-13 DIAGNOSIS — M6281 Muscle weakness (generalized): Secondary | ICD-10-CM | POA: Diagnosis present

## 2023-03-13 NOTE — Therapy (Signed)
OUTPATIENT PHYSICAL THERAPY VESTIBULAR EVALUATION     Patient Name: Alexander Parker MRN: [id number] DOB:08-29-1960, 62 y.o., male Today's Date: 03/13/2023  END OF SESSION:  PT End of Session - 03/13/23 1013     Visit Number 2    Number of Visits 13    Date for PT Re-Evaluation 05/10/23    Authorization Type BCBS    PT Start Time 1015    PT Stop Time 1100    PT Time Calculation (min) 45 min    Activity Tolerance Patient tolerated treatment well    Behavior During Therapy WFL for tasks assessed/performed             Past Medical History:  Diagnosis Date   Diabetes mellitus without complication (HCC)    Hyperlipidemia    Hypertension    Past Surgical History:  Procedure Laterality Date   CIRCUMCISION     Patient Active Problem List   Diagnosis Date Noted   TBI (traumatic brain injury) (HCC) 02/16/2023   Subdural hematoma caused by concussion (HCC) 02/15/2023   Uncontrolled type 2 diabetes mellitus with hyperglycemia (HCC) 07/04/2018   Pure hypercholesterolemia 06/23/2018   HTN (hypertension) 12/23/2011   Diabetes mellitus (HCC) 07/29/2011    PCP: Barry Brunner, PA-C REFERRING PROVIDER: Tyson Alias, MD  REFERRING DIAG: [phone] (ICD-10-CM) - Traumatic brain injury with loss of consciousness, subsequent encounter  THERAPY DIAG:  Unsteadiness on feet  Muscle weakness (generalized)  Dizziness and giddiness  ONSET DATE: 02/17/2023 referral  Rationale for Evaluation and Treatment: Rehabilitation  SUBJECTIVE:   SUBJECTIVE STATEMENT: Patient arrives to clinic with sister, no in-person interpretor present, so used [employer]. Denies falls. Still with some dizziness, initially rating at a 4/5, but states that's more of an average. Most dizziness with laying down on his side and when sitting back up.  Pt accompanied by: family member and interpreter: Zettie Pho [phone]  PERTINENT HISTORY: DM, HLD, HTN, recent BI  PAIN:  Are you having pain? Yes: NPRS  scale: 6/10 Pain location: head, back Pain description: achy Aggravating factors: laying flat on his back Relieving factors: pain rx  PRECAUTIONS: Fall   PATIENT GOALS: "I want my pain and dizziness to get better"  OBJECTIVE:  Note: Objective measures were completed at Evaluation unless otherwise noted.  DIAGNOSTIC FINDINGS: Head CT 02/11/23 IMPRESSION: 1. Scattered subarachnoid hemorrhage along the right Sylvian fissure, bilateral subfrontal sulci, and anterior falx. 2. Probable trace interhemispheric subdural hemorrhage along the falx near the vertex. 3. Linear, nondisplaced fracture of the left occipital bone. 4. No acute cervical spine fracture or traumatic listhesis.  VESTIBULAR ASSESSMENT:  VESTIBULAR - OCULAR REFLEX:   Slow VOR: Normal  VOR Cancellation: Unable to Maintain Gaze  Head-Impulse Test: HIT Right: positive HIT Left: unable to discern, significant cervical guarding  Dynamic Visual Acuity: static- line 10, dynamic- line 5   MOTION SENSITIVITY:  Motion Sensitivity Quotient Intensity: 0 = none, 1 = Lightheaded, 2 = Mild, 3 = Moderate, 4 = Severe, 5 = Vomiting  Intensity  1. Sitting to supine 3  2. Supine to L side 3  3. Supine to R side 4  4. Supine to sitting 3  5. L Hallpike-Dix 2  6. Up from L  2  7. R Hallpike-Dix 2  8. Up from R  2  9. Sitting, head tipped to L knee 0  10. Head up from L knee 0  11. Sitting, head tipped to R knee 0  12. Head up from R knee 0  13.  Sitting head turns x5 0  14.Sitting head nods x5 1  15. In stance, 180 turn to L  0  16. In stance, 180 turn to R 0      VESTIBULAR TREATMENT:                                                                                                   -Brandt daroff B x4   -increased dizziness to R, no significant change to dizziness on L  -DVA -MCTSIB  -condition 1: 30s  -condition 2: 30s  -condition 3: 30s  -condition 4: 30s  PATIENT EDUCATION: Education details: exam findings,  brandt daroff, continue VOR Person educated: Patient and Spouse Education method: Explanation and Demonstration Education comprehension: verbalized understanding  HOME EXERCISE PROGRAM: To be provided  GOALS: Goals reviewed with patient? Yes  SHORT TERM GOALS: Target date: 04/12/23  Pt will be independent with initial HEP for improved balance and symptoms  Baseline: to be provided Goal status: INITIAL  2.  Patient will demonstrate (-) positional testing to indicate resolution of BPPV  Baseline: to be reassessed PRN Goal status: INITIAL  3.  MCTSIB goal Baseline: not needed Goal status: DISCONTINUED  4.  Pt will report </= 2/5 for all movements on MSQ to indicate improvement in motion sensitivity and improved activity tolerance.   Baseline: up to 4/5 Goal status: REVISED  5.  Pt will improve DVA to </= 3 line difference to indicate improved VOR  Baseline: 5 line difference Goal status: REVISED   LONG TERM GOALS: Target date: 05/10/23  Pt will be independent with final HEP for improved balance and symptoms  Baseline: to be provided Goal status: INITIAL  Patient will demonstrate (-) positional testing to indicate resolution of BPPV  Baseline: to be reassessed PRN Goal status: INITIAL  MCTSIB goal Baseline: not needed Goal status: DISCONTINUED  Pt will report </= 1/5 for all movements on MSQ to indicate improvement in motion sensitivity and improved activity tolerance.   Baseline: up to 4/5 Goal status: REVISED  Pt will improve DVA to </= 2 line difference to indicate improved VOR  Baseline: 5 line difference Goal status: REVISED  6. Patient will score >/= 60 on FOTO to demonstrate improved symptoms  Baseline: 50  Goal status: INITIAL  ASSESSMENT:  CLINICAL IMPRESSION: Patient seen for skilled PT session with emphasis on vestibular retraining. Tolerated Austin Miles well with generally diminishing dizziness. Initially worse on the R, but with slower  response on the L. DVA demonstrating significant difficulty resulting in episode of emesis. Normal MCTSIB, though patient is generally unbalanced directly related to his dizziness.   OBJECTIVE IMPAIRMENTS: decreased activity tolerance, decreased balance, decreased knowledge of condition, dizziness, impaired vision/preception, and pain.   ACTIVITY LIMITATIONS: carrying, lifting, stairs, hygiene/grooming, locomotion level, and caring for others  PARTICIPATION LIMITATIONS: meal prep, cleaning, interpersonal relationship, driving, shopping, community activity, occupation, and yard work  PERSONAL FACTORS: Age, Fitness, Past/current experiences, Profession, Transportation, and 3+ comorbidities: see above  are also affecting patient's functional outcome.   REHAB POTENTIAL: Good  CLINICAL DECISION MAKING: Stable/uncomplicated  EVALUATION COMPLEXITY:  Low   PLAN:  PT FREQUENCY: 2x/week  PT DURATION: 6 weeks  PLANNED INTERVENTIONS: 97146- PT Re-evaluation, 97110-Therapeutic exercises, 97530- Therapeutic activity, 97112- Neuromuscular re-education, 97535- Self Care, 09811- Manual therapy, [phone]- Gait training, [phone]- Orthotic Fit/training, [phone]- Canalith repositioning, [phone]- Aquatic Therapy, Patient/Family education, Balance training, Stair training, Dry Needling, Vestibular training, Visual/preceptual remediation/compensation, and DME instructions  PLAN FOR NEXT SESSION: brandt daroff, VOR   Westley Foots, PT Westley Foots, PT, DPT, CBIS  03/13/2023, 11:23 AM

## 2023-03-18 ENCOUNTER — Ambulatory Visit: Payer: Worker's Compensation | Attending: Medical

## 2023-03-18 DIAGNOSIS — M6281 Muscle weakness (generalized): Secondary | ICD-10-CM

## 2023-03-18 DIAGNOSIS — R2681 Unsteadiness on feet: Secondary | ICD-10-CM

## 2023-03-18 DIAGNOSIS — R42 Dizziness and giddiness: Secondary | ICD-10-CM | POA: Diagnosis present

## 2023-03-18 NOTE — Therapy (Signed)
OUTPATIENT PHYSICAL THERAPY VESTIBULAR TREATMENT     Patient Name: Alexander Parker MRN: [id number] DOB:1961-02-12, 62 y.o., male Today's Date: 03/18/2023  END OF SESSION:  PT End of Session - 03/18/23 1018     Visit Number 4    Number of Visits 13    Date for PT Re-Evaluation 05/10/23    Authorization Type BCBS    PT Start Time 1015    PT Stop Time 1059    PT Time Calculation (min) 44 min    Activity Tolerance Patient tolerated treatment well    Behavior During Therapy WFL for tasks assessed/performed             Past Medical History:  Diagnosis Date   Diabetes mellitus without complication (HCC)    Hyperlipidemia    Hypertension    Past Surgical History:  Procedure Laterality Date   CIRCUMCISION     Patient Active Problem List   Diagnosis Date Noted   TBI (traumatic brain injury) (HCC) 02/16/2023   Subdural hematoma caused by concussion (HCC) 02/15/2023   Uncontrolled type 2 diabetes mellitus with hyperglycemia (HCC) 07/04/2018   Pure hypercholesterolemia 06/23/2018   HTN (hypertension) 12/23/2011   Diabetes mellitus (HCC) 07/29/2011    PCP: Barry Brunner, PA-C REFERRING PROVIDER: Tyson Alias, MD  REFERRING DIAG: S06.9X9D (ICD-10-CM) - Traumatic brain injury with loss of consciousness, subsequent encounter  THERAPY DIAG:  Unsteadiness on feet  Muscle weakness (generalized)  Dizziness and giddiness  ONSET DATE: 02/17/2023 referral  Rationale for Evaluation and Treatment: Rehabilitation  SUBJECTIVE:   SUBJECTIVE STATEMENT: Patient arrives to clinic with sister, interpretor. Reports intermittent pain in his neck with Austin Miles. Reports 3/5 dizziness now.  Pt accompanied by: family member and interpreter: Zettie Pho [phone]  PERTINENT HISTORY: DM, HLD, HTN, recent BI  PAIN:  Are you having pain? Yes: NPRS scale: 6/10 Pain location: head, back Pain description: achy Aggravating factors: laying flat on his back Relieving factors: pain  rx  PRECAUTIONS: Fall   PATIENT GOALS: "I want my pain and dizziness to get better"  OBJECTIVE:  Note: Objective measures were completed at Evaluation unless otherwise noted.  DIAGNOSTIC FINDINGS: Head CT 02/11/23 IMPRESSION: 1. Scattered subarachnoid hemorrhage along the right Sylvian fissure, bilateral subfrontal sulci, and anterior falx. 2. Probable trace interhemispheric subdural hemorrhage along the falx near the vertex. 3. Linear, nondisplaced fracture of the left occipital bone. 4. No acute cervical spine fracture or traumatic listhesis.   VESTIBULAR TREATMENT:                                                                                                   -L brandt daroff initially demonstrating L torsional upbeating nystagmus  Roll test (-) B R dix ahllpike (+) L dix hallpike (+)   R epley x1 cleared  L dix hallpike (-) on recheck   B brandt daroff x2 imporved dizziness -VOR horizontal 2x30s   -vertical 1x30s  PATIENT EDUCATION: Education details: exam findings, brandt daroff, continue VOR Person educated: Patient and Spouse Education method: Explanation and Demonstration Education comprehension: verbalized understanding  HOME EXERCISE PROGRAM: Access Code:  [email or web address] URL: [email or web address] Date: 03/18/2023 Prepared by: Merry Lofty  Exercises - Seated Gaze Stabilization with Head Rotation  - 1 x daily - 7 x weekly - 3 sets - 30s hold -brandt daroff   GOALS: Goals reviewed with patient? Yes  SHORT TERM GOALS: Target date: 04/12/23  Pt will be independent with initial HEP for improved balance and symptoms  Baseline: to be provided Goal status: INITIAL  2.  Patient will demonstrate (-) positional testing to indicate resolution of BPPV  Baseline: to be reassessed PRN Goal status: INITIAL  3.  MCTSIB goal Baseline: not needed Goal status: DISCONTINUED  4.  Pt will report </= 2/5 for all movements on MSQ to indicate improvement  in motion sensitivity and improved activity tolerance.   Baseline: up to 4/5 Goal status: REVISED  5.  Pt will improve DVA to </= 3 line difference to indicate improved VOR  Baseline: 5 line difference Goal status: REVISED   LONG TERM GOALS: Target date: 05/10/23  Pt will be independent with final HEP for improved balance and symptoms  Baseline: to be provided Goal status: INITIAL  Patient will demonstrate (-) positional testing to indicate resolution of BPPV  Baseline: to be reassessed PRN Goal status: INITIAL  MCTSIB goal Baseline: not needed Goal status: DISCONTINUED  Pt will report </= 1/5 for all movements on MSQ to indicate improvement in motion sensitivity and improved activity tolerance.   Baseline: up to 4/5 Goal status: REVISED  Pt will improve DVA to </= 2 line difference to indicate improved VOR  Baseline: 5 line difference Goal status: REVISED  6. Patient will score >/= 60 on FOTO to demonstrate improved symptoms  Baseline: 50  Goal status: INITIAL  ASSESSMENT:  CLINICAL IMPRESSION: Patient seen for skilled PT session with emphasis on vestibular retraining. Presenting today with reoccurrence of what appeared to be B posterior canal BPPV, L >R. Resolved with Epley x1 on the R, so possibly only unilateral BPPV. Progressing well with habituation, but required initial hand over hand assist with VOR tasks + metronome set to 60BPM. Continue POC.   OBJECTIVE IMPAIRMENTS: decreased activity tolerance, decreased balance, decreased knowledge of condition, dizziness, impaired vision/preception, and pain.   ACTIVITY LIMITATIONS: carrying, lifting, stairs, hygiene/grooming, locomotion level, and caring for others  PARTICIPATION LIMITATIONS: meal prep, cleaning, interpersonal relationship, driving, shopping, community activity, occupation, and yard work  PERSONAL FACTORS: Age, Fitness, Past/current experiences, Profession, Transportation, and 3+ comorbidities: see  above  are also affecting patient's functional outcome.   REHAB POTENTIAL: Good  CLINICAL DECISION MAKING: Stable/uncomplicated  EVALUATION COMPLEXITY: Low   PLAN:  PT FREQUENCY: 2x/week  PT DURATION: 6 weeks  PLANNED INTERVENTIONS: 97146- PT Re-evaluation, 97110-Therapeutic exercises, 97530- Therapeutic activity, 97112- Neuromuscular re-education, 97535- Self Care, 13244- Manual therapy, [phone]- Gait training, [phone]- Orthotic Fit/training, [phone]- Canalith repositioning, [phone]- Aquatic Therapy, Patient/Family education, Balance training, Stair training, Dry Needling, Vestibular training, Visual/preceptual remediation/compensation, and DME instructions  PLAN FOR NEXT SESSION: brandt daroff, VOR   Westley Foots, PT Westley Foots, PT, DPT, CBIS  03/18/2023, 12:06 PM

## 2023-03-21 ENCOUNTER — Ambulatory Visit: Payer: BC Managed Care – PPO | Attending: Medical

## 2023-03-21 DIAGNOSIS — R42 Dizziness and giddiness: Secondary | ICD-10-CM | POA: Diagnosis present

## 2023-03-21 DIAGNOSIS — M6281 Muscle weakness (generalized): Secondary | ICD-10-CM | POA: Diagnosis present

## 2023-03-21 DIAGNOSIS — R2681 Unsteadiness on feet: Secondary | ICD-10-CM | POA: Insufficient documentation

## 2023-03-21 NOTE — Therapy (Signed)
OUTPATIENT PHYSICAL THERAPY VESTIBULAR TREATMENT     Patient Name: Alexander Parker MRN: [id number] DOB:Aug 04, 1960, 62 y.o., male Today's Date: 03/21/2023  END OF SESSION:  PT End of Session - 03/21/23 1103     Visit Number 5    Number of Visits 13    Date for PT Re-Evaluation 05/10/23    Authorization Type BCBS    Authorization - Number of Visits 12    PT Start Time 1100    PT Stop Time 1140    PT Time Calculation (min) 40 min    Activity Tolerance Patient tolerated treatment well    Behavior During Therapy WFL for tasks assessed/performed             Past Medical History:  Diagnosis Date   Diabetes mellitus without complication (HCC)    Hyperlipidemia    Hypertension    Past Surgical History:  Procedure Laterality Date   CIRCUMCISION     Patient Active Problem List   Diagnosis Date Noted   TBI (traumatic brain injury) (HCC) 02/16/2023   Subdural hematoma caused by concussion (HCC) 02/15/2023   Uncontrolled type 2 diabetes mellitus with hyperglycemia (HCC) 07/04/2018   Pure hypercholesterolemia 06/23/2018   HTN (hypertension) 12/23/2011   Diabetes mellitus (HCC) 07/29/2011    PCP: Barry Brunner, PA-C REFERRING PROVIDER: Tyson Alias, MD  REFERRING DIAG: S06.9X9D (ICD-10-CM) - Traumatic brain injury with loss of consciousness, subsequent encounter  THERAPY DIAG:  Unsteadiness on feet  Muscle weakness (generalized)  Dizziness and giddiness  ONSET DATE: 02/17/2023 referral  Rationale for Evaluation and Treatment: Rehabilitation  SUBJECTIVE:   SUBJECTIVE STATEMENT: Patient arrives to clinic with sister, interpretor. Reports some dizziness, denies falls.  Pt accompanied by: family member and interpreter:    PERTINENT HISTORY: DM, HLD, HTN, recent BI  PAIN:  Are you having pain? Yes: NPRS scale: 6/10 Pain location: head, back Pain description: achy Aggravating factors: laying flat on his back Relieving factors: pain rx  PRECAUTIONS:  Fall   PATIENT GOALS: "I want my pain and dizziness to get better"  OBJECTIVE:  Note: Objective measures were completed at Evaluation unless otherwise noted.  DIAGNOSTIC FINDINGS: Head CT 02/11/23 IMPRESSION: 1. Scattered subarachnoid hemorrhage along the right Sylvian fissure, bilateral subfrontal sulci, and anterior falx. 2. Probable trace interhemispheric subdural hemorrhage along the falx near the vertex. 3. Linear, nondisplaced fracture of the left occipital bone. 4. No acute cervical spine fracture or traumatic listhesis.   VESTIBULAR TREATMENT:                                                                                                   -L dix hall pike (+) L torsional upbeating nystagmus lasting ~8s, brisk   -treated with Epley x 2 with resolved symptoms -VOR horizontal/vertical 3x30s   -reports only mild neck pain with horizontal and "clicking" - VOR cancellation horizontal/vertical 2x30s  -horizontal slightly provoking, vertical not  -diagonal VOR cancellation x30s with less dizziness on L -> R diagonal  -ball toss visual tracking while ambulating  -ball circles visual tracking while ambulating   PATIENT EDUCATION: Education details:  continue HEP Person educated: Patient and sister Education method: Explanation and Demonstration Education comprehension: verbalized understanding  HOME EXERCISE PROGRAM: Access Code: [email or web address] URL: [email or web address] Date: 03/18/2023 Prepared by: Merry Lofty  Exercises - Seated Gaze Stabilization with Head Rotation  - 1 x daily - 7 x weekly - 3 sets - 30s hold -brandt daroff   GOALS: Goals reviewed with patient? Yes  SHORT TERM GOALS: Target date: 04/12/23  Pt will be independent with initial HEP for improved balance and symptoms  Baseline: to be provided Goal status: INITIAL  2.  Patient will demonstrate (-) positional testing to indicate resolution of BPPV  Baseline: to be reassessed PRN Goal  status: INITIAL  3.  MCTSIB goal Baseline: not needed Goal status: DISCONTINUED  4.  Pt will report </= 2/5 for all movements on MSQ to indicate improvement in motion sensitivity and improved activity tolerance.   Baseline: up to 4/5 Goal status: REVISED  5.  Pt will improve DVA to </= 3 line difference to indicate improved VOR  Baseline: 5 line difference Goal status: REVISED   LONG TERM GOALS: Target date: 05/10/23  Pt will be independent with final HEP for improved balance and symptoms  Baseline: to be provided Goal status: INITIAL  Patient will demonstrate (-) positional testing to indicate resolution of BPPV  Baseline: to be reassessed PRN Goal status: INITIAL  MCTSIB goal Baseline: not needed Goal status: DISCONTINUED  Pt will report </= 1/5 for all movements on MSQ to indicate improvement in motion sensitivity and improved activity tolerance.   Baseline: up to 4/5 Goal status: REVISED  Pt will improve DVA to </= 2 line difference to indicate improved VOR  Baseline: 5 line difference Goal status: REVISED  6. Patient will score >/= 60 on FOTO to demonstrate improved symptoms  Baseline: 50  Goal status: INITIAL  ASSESSMENT:  CLINICAL IMPRESSION: Patient seen for skilled PT session with emphasis on vestibular retraining. Patient with return of L posterior canal canalithiasis. Treated with Epley x2. Improving tolerance to VOR and VOR cancellation tasks. Remains most sensitive to horizontal tasks. Continue POC.   OBJECTIVE IMPAIRMENTS: decreased activity tolerance, decreased balance, decreased knowledge of condition, dizziness, impaired vision/preception, and pain.   ACTIVITY LIMITATIONS: carrying, lifting, stairs, hygiene/grooming, locomotion level, and caring for others  PARTICIPATION LIMITATIONS: meal prep, cleaning, interpersonal relationship, driving, shopping, community activity, occupation, and yard work  PERSONAL FACTORS: Age, Fitness, Past/current  experiences, Profession, Transportation, and 3+ comorbidities: see above  are also affecting patient's functional outcome.   REHAB POTENTIAL: Good  CLINICAL DECISION MAKING: Stable/uncomplicated  EVALUATION COMPLEXITY: Low   PLAN:  PT FREQUENCY: 2x/week  PT DURATION: 6 weeks  PLANNED INTERVENTIONS: 97146- PT Re-evaluation, 97110-Therapeutic exercises, 97530- Therapeutic activity, 97112- Neuromuscular re-education, 97535- Self Care, 16109- Manual therapy, [phone]- Gait training, [phone]- Orthotic Fit/training, [phone]- Canalith repositioning, [phone]- Aquatic Therapy, Patient/Family education, Balance training, Stair training, Dry Needling, Vestibular training, Visual/preceptual remediation/compensation, and DME instructions  PLAN FOR NEXT SESSION: brandt daroff, VOR   Westley Foots, PT Westley Foots, PT, DPT, CBIS  03/21/2023, 12:59 PM

## 2023-03-25 ENCOUNTER — Ambulatory Visit: Payer: Worker's Compensation | Admitting: Physical Therapy

## 2023-03-25 ENCOUNTER — Encounter: Payer: Self-pay | Admitting: Physical Therapy

## 2023-03-25 VITALS — BP 112/66 | HR 87

## 2023-03-25 DIAGNOSIS — R2681 Unsteadiness on feet: Secondary | ICD-10-CM

## 2023-03-25 DIAGNOSIS — M6281 Muscle weakness (generalized): Secondary | ICD-10-CM

## 2023-03-25 DIAGNOSIS — R42 Dizziness and giddiness: Secondary | ICD-10-CM

## 2023-03-25 NOTE — Therapy (Signed)
OUTPATIENT PHYSICAL THERAPY VESTIBULAR TREATMENT     Patient Name: Alexander Parker MRN: [id number] DOB:[birth year], 61 y.o., male Today's Date: 03/25/2023  END OF SESSION:  PT End of Session - 03/25/23 1104     Visit Number 6    Number of Visits 13    Date for PT Re-Evaluation 05/10/23    Authorization Type BCBS    Authorization - Number of Visits 12    PT Start Time 1102    PT Stop Time 1145    PT Time Calculation (min) 43 min    Equipment Utilized During Treatment Gait belt    Activity Tolerance Patient tolerated treatment well    Behavior During Therapy WFL for tasks assessed/performed             Past Medical History:  Diagnosis Date   Diabetes mellitus without complication (HCC)    Hyperlipidemia    Hypertension    Past Surgical History:  Procedure Laterality Date   CIRCUMCISION     Patient Active Problem List   Diagnosis Date Noted   TBI (traumatic brain injury) (HCC) 02/16/2023   Subdural hematoma caused by concussion (HCC) 02/15/2023   Uncontrolled type 2 diabetes mellitus with hyperglycemia (HCC) 07/04/2018   Pure hypercholesterolemia 06/23/2018   HTN (hypertension) 12/23/2011   Diabetes mellitus (HCC) 07/29/2011    PCP: Barry Brunner, PA-C REFERRING PROVIDER: Tyson Alias, MD  REFERRING DIAG: S06.9X9D (ICD-10-CM) - Traumatic brain injury with loss of consciousness, subsequent encounter  THERAPY DIAG:  Unsteadiness on feet  Muscle weakness (generalized)  Dizziness and giddiness  ONSET DATE: 02/17/2023 referral  Rationale for Evaluation and Treatment: Rehabilitation  SUBJECTIVE:   SUBJECTIVE STATEMENT: Patient reporting that dizziness has been better overall but is still experiencing some room spinning dizziness. Denies falls/near falls.   Pt accompanied by: family member and interpreter: Carlean Jews  PERTINENT HISTORY: DM, HLD, HTN, recent BI  PAIN:  Are you having pain? Yes: NPRS scale: unrated but states just a  little/10 Pain location: head, back Pain description: achy Aggravating factors: laying flat on his back Relieving factors: pain rx  PRECAUTIONS: Fall   PATIENT GOALS: "I want my pain and dizziness to get better"  OBJECTIVE:  Note: Objective measures were completed at Evaluation unless otherwise noted.  DIAGNOSTIC FINDINGS: Head CT 02/11/23 IMPRESSION: 1. Scattered subarachnoid hemorrhage along the right Sylvian fissure, bilateral subfrontal sulci, and anterior falx. 2. Probable trace interhemispheric subdural hemorrhage along the falx near the vertex. 3. Linear, nondisplaced fracture of the left occipital bone. 4. No acute cervical spine fracture or traumatic listhesis.   VESTIBULAR TREATMENT:                                                                                                    Vitals:   03/25/23 1109  BP: 112/66  Pulse: 87   Assessed at rest, seated on L arm  NMR: Positional testing L Dix Hallpike: negative R Dix Hallpike: negative Roll test: negative bilaterally  Canalith Repositioning - not indicated as all positional testing clear  Gaze Adaptation: x1 Viewing Horizontal: Position: standing, Time: 30  seconds, Reps: 2, and Comment: increased challenge keeping eyes on target, recommend turning head smaller amplitude to reduce pain, increased challenge keeping eyes on target and x1 Viewing Vertical:  Position: standing, Time: 30 seconds, Reps: 2, and Comment: no major increase in symptoms Habituation: Brandt-Daroff: number of reps: 3 to L and 2 to R, more symptomatic to L    Standing on large dark blue foam with ball tosses to self 2 x 5 (had to stop after second set due to feeling "very dizzy"  Cone pickups and stacks 2 x 6 cones for head turns  Cone pickups and stack with rotation 2 x 6 cones   PATIENT EDUCATION: Education details: continue HEP + resolution of BPPV and what to do if vertigo was to return  Person educated: Patient and  sister Education method: Medical illustrator Education comprehension: verbalized understanding  HOME EXERCISE PROGRAM: Access Code: [email or web address] URL: [email or web address] Date: 03/18/2023 Prepared by: Merry Lofty  Exercises - Seated Gaze Stabilization with Head Rotation  - 1 x daily - 7 x weekly - 3 sets - 30s hold -brandt daroff   GOALS: Goals reviewed with patient? Yes  SHORT TERM GOALS: Target date: 04/12/23  Pt will be independent with initial HEP for improved balance and symptoms  Baseline: to be provided Goal status: INITIAL  2.  Patient will demonstrate (-) positional testing to indicate resolution of BPPV  Baseline: to be reassessed PRN Goal status: INITIAL  3.  MCTSIB goal Baseline: not needed Goal status: DISCONTINUED  4.  Pt will report </= 2/5 for all movements on MSQ to indicate improvement in motion sensitivity and improved activity tolerance.   Baseline: up to 4/5 Goal status: REVISED  5.  Pt will improve DVA to </= 3 line difference to indicate improved VOR  Baseline: 5 line difference Goal status: REVISED   LONG TERM GOALS: Target date: 05/10/23  Pt will be independent with final HEP for improved balance and symptoms  Baseline: to be provided Goal status: INITIAL  Patient will demonstrate (-) positional testing to indicate resolution of BPPV  Baseline: to be reassessed PRN Goal status: INITIAL  MCTSIB goal Baseline: not needed Goal status: DISCONTINUED  Pt will report </= 1/5 for all movements on MSQ to indicate improvement in motion sensitivity and improved activity tolerance.   Baseline: up to 4/5 Goal status: REVISED  Pt will improve DVA to </= 2 line difference to indicate improved VOR  Baseline: 5 line difference Goal status: REVISED  6. Patient will score >/= 60 on FOTO to demonstrate improved symptoms  Baseline: 50  Goal status: INITIAL  ASSESSMENT:  CLINICAL IMPRESSION: Patient seen for skilled  PT session with emphasis on vestibular retraining with emphasis on gaze stabilizaton and habituation. Patient continues to report low levels of dizziness with greater challenge noted with horizontal in today's session. Patient without signs of BPPV at this time.Continue POC.   OBJECTIVE IMPAIRMENTS: decreased activity tolerance, decreased balance, decreased knowledge of condition, dizziness, impaired vision/preception, and pain.   ACTIVITY LIMITATIONS: carrying, lifting, stairs, hygiene/grooming, locomotion level, and caring for others  PARTICIPATION LIMITATIONS: meal prep, cleaning, interpersonal relationship, driving, shopping, community activity, occupation, and yard work  PERSONAL FACTORS: Age, Fitness, Past/current experiences, Profession, Transportation, and 3+ comorbidities: see above  are also affecting patient's functional outcome.   REHAB POTENTIAL: Good  CLINICAL DECISION MAKING: Stable/uncomplicated  EVALUATION COMPLEXITY: Low   PLAN:  PT FREQUENCY: 2x/week  PT DURATION: 6 weeks  PLANNED INTERVENTIONS: 97146- PT Re-evaluation, 97110-Therapeutic exercises, 97530-  Therapeutic activity, [id number]- Neuromuscular re-education, [phone]- Self Care, 53664- Manual therapy, [phone]- Gait training, [phone]- Orthotic Fit/training, [phone]- Canalith repositioning, [phone]- Aquatic Therapy, Patient/Family education, Balance training, Stair training, Dry Needling, Vestibular training, Visual/preceptual remediation/compensation, and DME instructions  PLAN FOR NEXT SESSION: progress VOR, balance with head turns, HART chart, card pickups    Carmelia Bake, PT, DPT  03/25/2023, 1:06 PM

## 2023-03-26 NOTE — Progress Notes (Signed)
[facility] Attending  Case discussed with the resident at the time of the visit.  We reviewed the resident's history and exam and pertinent patient test results.  I agree with the assessment, diagnosis, and plan of care documented in the resident's note.  Debe Coder, MD

## 2023-03-27 ENCOUNTER — Ambulatory Visit: Payer: Worker's Compensation

## 2023-03-27 DIAGNOSIS — M6281 Muscle weakness (generalized): Secondary | ICD-10-CM

## 2023-03-27 DIAGNOSIS — R2681 Unsteadiness on feet: Secondary | ICD-10-CM | POA: Diagnosis not present

## 2023-03-27 DIAGNOSIS — R42 Dizziness and giddiness: Secondary | ICD-10-CM

## 2023-03-27 NOTE — Therapy (Signed)
OUTPATIENT PHYSICAL THERAPY VESTIBULAR TREATMENT     Patient Name: Alexander Parker MRN: [id number] DOB:March 23, 1961, 62 y.o., male Today's Date: 03/27/2023  END OF SESSION:  PT End of Session - 03/27/23 1016     Visit Number 7    Number of Visits 13    Date for PT Re-Evaluation 05/10/23    Authorization Type BCBS    Authorization - Number of Visits 12    PT Start Time 1014    PT Stop Time 1058    PT Time Calculation (min) 44 min    Equipment Utilized During Treatment Gait belt    Activity Tolerance Patient tolerated treatment well    Behavior During Therapy WFL for tasks assessed/performed             Past Medical History:  Diagnosis Date   Diabetes mellitus without complication (HCC)    Hyperlipidemia    Hypertension    Past Surgical History:  Procedure Laterality Date   CIRCUMCISION     Patient Active Problem List   Diagnosis Date Noted   TBI (traumatic brain injury) (HCC) 02/16/2023   Subdural hematoma caused by concussion (HCC) 02/15/2023   Uncontrolled type 2 diabetes mellitus with hyperglycemia (HCC) 07/04/2018   Pure hypercholesterolemia 06/23/2018   HTN (hypertension) 12/23/2011   Diabetes mellitus (HCC) 07/29/2011    PCP: Barry Brunner, PA-C REFERRING PROVIDER: Tyson Alias, MD  REFERRING DIAG: S06.9X9D (ICD-10-CM) - Traumatic brain injury with loss of consciousness, subsequent encounter  THERAPY DIAG:  Unsteadiness on feet  Dizziness and giddiness  Muscle weakness (generalized)  ONSET DATE: 02/17/2023 referral  Rationale for Evaluation and Treatment: Rehabilitation  SUBJECTIVE:   SUBJECTIVE STATEMENT: Patient reporting that dizziness has been better overall but is still experiencing some room spinning dizziness. Denies falls/near falls.   Pt accompanied by: family member and interpreter: Carlean Jews  PERTINENT HISTORY: DM, HLD, HTN, recent BI  PAIN:  Are you having pain? Yes: NPRS scale: unrated but states just a  little/10 Pain location: head, back Pain description: achy Aggravating factors: laying flat on his back Relieving factors: pain rx  PRECAUTIONS: Fall   PATIENT GOALS: "I want my pain and dizziness to get better"  OBJECTIVE:  Note: Objective measures were completed at Evaluation unless otherwise noted.  DIAGNOSTIC FINDINGS: Head CT 02/11/23 IMPRESSION: 1. Scattered subarachnoid hemorrhage along the right Sylvian fissure, bilateral subfrontal sulci, and anterior falx. 2. Probable trace interhemispheric subdural hemorrhage along the falx near the vertex. 3. Linear, nondisplaced fracture of the left occipital bone. 4. No acute cervical spine fracture or traumatic listhesis.   VESTIBULAR TREATMENT:                                                                                                      NMR: Positional testing L Dix Hallpike: negative  -reported increased dizziness when sitting up, but not spinning, no nystagmus  R Dix Hallpike: negative  Gaze Adaptation: x1 Viewing Horizontal: Position: standing, Time: 30 seconds, Reps: 2, and Comment: required verbal cues to maintain gaze on target with horizontal > vertical and x1 Viewing Vertical:  Position: standing, Time: 30 seconds, Reps: 2, and Comment: minor increase in dizziness Habituation: Brandt-Daroff: number of reps: 1 to L and 3 to R, more symptomatic to R   -standing on dense foam: VOR cancellation horizontal  -ball toss to self for vertical VOR cancellation  -standing on dense foam Hart Chart on diagonal  -seated dynamic visual acuity   -verbal cues to increase speed of head   PATIENT EDUCATION: Education details: continue HEP  Person educated: Patient and sister Education method: Medical illustrator Education comprehension: verbalized understanding  HOME EXERCISE PROGRAM: Access Code: [email or web address] URL: [email or web address] Date: 03/18/2023 Prepared by: Merry Lofty  Exercises -  Seated Gaze Stabilization with Head Rotation  - 1 x daily - 7 x weekly - 3 sets - 30s hold -brandt daroff   GOALS: Goals reviewed with patient? Yes  SHORT TERM GOALS: Target date: 04/12/23  Pt will be independent with initial HEP for improved balance and symptoms  Baseline: to be provided Goal status: INITIAL  2.  Patient will demonstrate (-) positional testing to indicate resolution of BPPV  Baseline: to be reassessed PRN Goal status: INITIAL  3.  MCTSIB goal Baseline: not needed Goal status: DISCONTINUED  4.  Pt will report </= 2/5 for all movements on MSQ to indicate improvement in motion sensitivity and improved activity tolerance.   Baseline: up to 4/5 Goal status: REVISED  5.  Pt will improve DVA to </= 3 line difference to indicate improved VOR  Baseline: 5 line difference Goal status: REVISED   LONG TERM GOALS: Target date: 05/10/23  Pt will be independent with final HEP for improved balance and symptoms  Baseline: to be provided Goal status: INITIAL  Patient will demonstrate (-) positional testing to indicate resolution of BPPV  Baseline: to be reassessed PRN Goal status: INITIAL  MCTSIB goal Baseline: not needed Goal status: DISCONTINUED  Pt will report </= 1/5 for all movements on MSQ to indicate improvement in motion sensitivity and improved activity tolerance.   Baseline: up to 4/5 Goal status: REVISED  Pt will improve DVA to </= 2 line difference to indicate improved VOR  Baseline: 5 line difference Goal status: REVISED  6. Patient will score >/= 60 on FOTO to demonstrate improved symptoms  Baseline: 50  Goal status: INITIAL  ASSESSMENT:  CLINICAL IMPRESSION: Patient seen for skilled PT session with emphasis on vestibular retraining. BPPV remains clear. Does continue to report increase dizziness with higher speed activities, primarily in horizontal plane. 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 Continue POC.   OBJECTIVE IMPAIRMENTS: decreased activity tolerance, decreased balance, decreased knowledge of condition, dizziness, impaired vision/preception, and pain.   ACTIVITY LIMITATIONS: carrying, lifting, stairs, hygiene/grooming, locomotion level, and caring for others  PARTICIPATION LIMITATIONS: meal prep, cleaning, interpersonal relationship, driving, shopping, community activity, occupation, and yard work  PERSONAL FACTORS: Age, Fitness, Past/current experiences, Profession, Transportation, and 3+ comorbidities: see above  are also affecting patient's functional outcome.   REHAB POTENTIAL: Good  CLINICAL DECISION MAKING: Stable/uncomplicated  EVALUATION COMPLEXITY: Low   PLAN:  PT FREQUENCY: 2x/week  PT DURATION: 6 weeks  PLANNED INTERVENTIONS: 97146- PT Re-evaluation, 97110-Therapeutic exercises, 97530- Therapeutic activity, [id number]- Neuromuscular re-education, 97535- Self Care, 86578- Manual therapy, [phone]- Gait training, [phone]- Orthotic Fit/training, [phone]- Canalith repositioning, [phone]- Aquatic Therapy, Patient/Family education, Balance training, Stair training, Dry Needling, Vestibular training, Visual/preceptual remediation/compensation, and DME instructions  PLAN FOR NEXT SESSION: progress VOR, balance with head turns, HART chart, card pickups, faster  speed head movements, return to work tasks   Westley Foots, PT Westley Foots, PT, DPT, CBIS   03/27/2023, 11:06 AM

## 2023-04-01 ENCOUNTER — Ambulatory Visit: Payer: Worker's Compensation | Attending: Medical | Admitting: Physical Therapy

## 2023-04-01 ENCOUNTER — Encounter: Payer: Self-pay | Admitting: Physical Therapy

## 2023-04-01 DIAGNOSIS — R2681 Unsteadiness on feet: Secondary | ICD-10-CM

## 2023-04-01 DIAGNOSIS — R42 Dizziness and giddiness: Secondary | ICD-10-CM

## 2023-04-01 DIAGNOSIS — M6281 Muscle weakness (generalized): Secondary | ICD-10-CM | POA: Insufficient documentation

## 2023-04-01 NOTE — Therapy (Signed)
OUTPATIENT PHYSICAL THERAPY VESTIBULAR TREATMENT     Patient Name: Alexander Parker MRN: [id number] DOB:[birth year], 62 y.o., male Today's Date: 04/01/2023  END OF SESSION:  PT End of Session - 04/01/23 1924     Visit Number 7    Number of Visits 12    Date for PT Re-Evaluation 05/10/23    Authorization Type WC    Authorization - Visit Number 7    Authorization - Number of Visits 12    PT Start Time 1102    PT Stop Time 1147    PT Time Calculation (min) 45 min    Equipment Utilized During Treatment Gait belt    Activity Tolerance Patient tolerated treatment well    Behavior During Therapy WFL for tasks assessed/performed              Past Medical History:  Diagnosis Date   Diabetes mellitus without complication (HCC)    Hyperlipidemia    Hypertension    Past Surgical History:  Procedure Laterality Date   CIRCUMCISION     Patient Active Problem List   Diagnosis Date Noted   TBI (traumatic brain injury) (HCC) 02/16/2023   Subdural hematoma caused by concussion (HCC) 02/15/2023   Uncontrolled type 2 diabetes mellitus with hyperglycemia (HCC) 07/04/2018   Pure hypercholesterolemia 06/23/2018   HTN (hypertension) 12/23/2011   Diabetes mellitus (HCC) 07/29/2011    PCP: Barry Brunner, PA-C REFERRING PROVIDER: Tyson Alias, MD  REFERRING DIAG: S06.9X9D (ICD-10-CM) - Traumatic brain injury with loss of consciousness, subsequent encounter  THERAPY DIAG:  Dizziness and giddiness  Unsteadiness on feet  ONSET DATE: 02/17/2023 referral  Rationale for Evaluation and Treatment: Rehabilitation  SUBJECTIVE:   SUBJECTIVE STATEMENT: Patient reports dizziness is much better - does not have it very much at all anymore, only occasionally and it doesn't last very long.  Pt reports neck soreness/discomfort which is aggravated when he does the x1 viewing exercise   Pt accompanied by: family member and interpreter:    PERTINENT HISTORY: DM, HLD, HTN, recent  BI  PAIN:  Are you having pain? Yes: NPRS scale: unrated - varies in occurrence/10 Pain location: posterior neck - both sides Pain description: achy, soreness Aggravating factors: head turns aggravate and also gaze stabilization exercise Relieving factors: heat helps some  PRECAUTIONS: Fall   PATIENT GOALS: "I want my pain and dizziness to get better"  OBJECTIVE:  Note: Objective measures were completed at Evaluation unless otherwise noted.  DIAGNOSTIC FINDINGS: Head CT 02/11/23 IMPRESSION: 1. Scattered subarachnoid hemorrhage along the right Sylvian fissure, bilateral subfrontal sulci, and anterior falx. 2. Probable trace interhemispheric subdural hemorrhage along the falx near the vertex. 3. Linear, nondisplaced fracture of the left occipital bone. 4. No acute cervical spine fracture or traumatic listhesis.   VESTIBULAR TREATMENT:   04-01-23                                                                                                 NeuroRe-ed: X1 viewing exercise in standing - target on plain background 5' away - horizontal 60 secs x 1 rep - pt reported mild increase  in dizziness upon completion of exercise - approx. 40 secs needed to allow dizziness to subside;  Vertical - 60 secs x 1 rep in standing - no c/o increased dizziness upon completion of this exercise  Standing Balance: Surface: Pillows (2) Position: Feet Hip Width Apart Completed with: Eyes Open and Eyes Closed; Head Turns x 5 Reps and Head Nods x 5 Reps  Pt stood on pillows in corner - performed trunk rotations - 5 reps straight across; 5 reps each diagonal "X" - with EO using hands as targets; pt reported increased dizziness with reaching up with Rt hand/down with Lt hand  Pt performed amb. With horizontal head turns 35' x 2 reps with SBA - pt reported increased neck discomfort/pain with this activity  Pt stood on blue mat on floor - 5# kettlebell used - pt performed reaching down toward Lt foot to pick up  kettlebell, then lifting up in diagonal pattern to Rt side 3 reps, then 3 reps opposite diagonal "X" - no c/o dizziness with this activity  Pt picked up kettlebell off floor - returned to upright standing - turned 180 degrees 2 reps to Lt side and 2 reps to Rt side; no c/o dizziness with this exercise  TherEx: Pt instructed in cervical rotation stretch with use of towel for gentle overpressure and also in lateral cervical flexor stretch for Rt and Lt sides - added these stretches to HEP  HEP - updated Access Code: [email or web address] URL: [email or web address] Date: 04/01/2023 Prepared by: Maebelle Munroe  Exercises - Seated Assisted Cervical Rotation with Towel  - 1 x daily - 7 x weekly - 1 sets - 2-3 reps - 15 sec hold - Seated Cervical Sidebending Stretch  - 1 x daily - 7 x weekly - 1 sets - 2-3 reps - 15 sec hold       PATIENT EDUCATION: Education details: [employer] HEP updated to include cervical stretches on 04-01-23 Person educated: Patient and sister Education method: Explanation and Demonstration/ handouts (in [place]) Education comprehension: verbalized understanding, demonstrated understanding   HOME EXERCISE PROGRAM: Access Code: [email or web address] URL: [email or web address] Date: 03/18/2023 Prepared by: Merry Lofty  Exercises - Seated Gaze Stabilization with Head Rotation  - 1 x daily - 7 x weekly - 3 sets - 30s hold -brandt daroff   GOALS: Goals reviewed with patient? Yes  SHORT TERM GOALS: Target date: 04/12/23  Pt will be independent with initial HEP for improved balance and symptoms  Baseline: to be provided Goal status: INITIAL  2.  Patient will demonstrate (-) positional testing to indicate resolution of BPPV  Baseline: to be reassessed PRN Goal status: INITIAL  3.  MCTSIB goal Baseline: not needed Goal status: DISCONTINUED  4.  Pt will report </= 2/5 for all movements on MSQ to indicate improvement in motion sensitivity and improved  activity tolerance.   Baseline: up to 4/5 Goal status: REVISED  5.  Pt will improve DVA to </= 3 line difference to indicate improved VOR  Baseline: 5 line difference Goal status: REVISED   LONG TERM GOALS: Target date: 05/10/23  Pt will be independent with final HEP for improved balance and symptoms  Baseline: to be provided Goal status: INITIAL  Patient will demonstrate (-) positional testing to indicate resolution of BPPV  Baseline: to be reassessed PRN Goal status: INITIAL  MCTSIB goal Baseline: not needed Goal status: DISCONTINUED  Pt will report </= 1/5 for all movements on MSQ to indicate improvement in motion sensitivity and improved activity tolerance.  Baseline: up to 4/5 Goal status: REVISED  Pt will improve DVA to </= 2 line difference to indicate improved VOR  Baseline: 5 line difference Goal status: REVISED  6. Patient will score >/= 60 on FOTO to demonstrate improved symptoms  Baseline: 50  Goal status: INITIAL  ASSESSMENT:  CLINICAL IMPRESSION: PT session focused on exercises to increase vestibular input and improve gaze stabilization.  Pt reports minimal dizziness at this time but rather reports main problem now is cervical pain/discomfort which is aggravated by horizontal head turns.  Pt reported most dizziness provoked with x1 viewing horizontally with pt needing approx. 40 secs to allow dizziness to subside.  Pt reported dizziness intensity 2-3/10.  Pt may benefit from dry needling to assist in cervical pain management.  Continue POC.   OBJECTIVE IMPAIRMENTS: decreased activity tolerance, decreased balance, decreased knowledge of condition, dizziness, impaired vision/preception, and pain.   ACTIVITY LIMITATIONS: carrying, lifting, stairs, hygiene/grooming, locomotion level, and caring for others  PARTICIPATION LIMITATIONS: meal prep, cleaning, interpersonal relationship, driving, shopping, community activity, occupation, and yard work  PERSONAL  FACTORS: Age, Fitness, Past/current experiences, Profession, Transportation, and 3+ comorbidities: see above  are also affecting patient's functional outcome.   REHAB POTENTIAL: Good  CLINICAL DECISION MAKING: Stable/uncomplicated  EVALUATION COMPLEXITY: Low   PLAN:  PT FREQUENCY: 2x/week  PT DURATION: 6 weeks  PLANNED INTERVENTIONS: 97146- PT Re-evaluation, 97110-Therapeutic exercises, 97530- Therapeutic activity, 97112- Neuromuscular re-education, 97535- Self Care, 40981- Manual therapy, [phone]- Gait training, [phone]- Orthotic Fit/training, [phone]- Canalith repositioning, [phone]- Aquatic Therapy, Patient/Family education, Balance training, Stair training, Dry Needling, Vestibular training, Visual/preceptual remediation/compensation, and DME instructions  PLAN FOR NEXT SESSION: how did cervical stretches go? Pt would possibly benefit from dry needling - continue visual/vestibular exercises - reassess DVA  progress VOR, balance with head turns, HART chart, card pickups, faster speed head movements, return to work tasks   Kary Kos, PT    04/01/2023, 7:29 PM

## 2023-04-04 ENCOUNTER — Encounter: Payer: Self-pay | Admitting: Physical Therapy

## 2023-04-04 ENCOUNTER — Ambulatory Visit: Payer: Worker's Compensation | Admitting: Physical Therapy

## 2023-04-04 DIAGNOSIS — R42 Dizziness and giddiness: Secondary | ICD-10-CM

## 2023-04-04 DIAGNOSIS — R2681 Unsteadiness on feet: Secondary | ICD-10-CM
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
## 2023-04-04 NOTE — Patient Instructions (Signed)
Rolling    With pillow under head, start on back. Roll to right. Hold position until symptoms subside. Roll onto left side. Hold position until symptoms subside. Repeat sequence __5__ times per session. Do __1-2__ sessions per day.  Copyright  VHI. All rights reserved.

## 2023-04-08 ENCOUNTER — Encounter: Payer: BC Managed Care – PPO | Admitting: Physical Therapy

## 2023-04-10 ENCOUNTER — Ambulatory Visit: Payer: Worker's Compensation | Admitting: Physical Therapy

## 2023-04-10 DIAGNOSIS — M6281 Muscle weakness (generalized): Secondary | ICD-10-CM

## 2023-04-10 DIAGNOSIS — R42 Dizziness and giddiness: Secondary | ICD-10-CM | POA: Diagnosis not present
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
## 2023-04-15 ENCOUNTER — Ambulatory Visit: Payer: Worker's Compensation | Attending: Medical | Admitting: Physical Therapy

## 2023-04-15 DIAGNOSIS — R2681 Unsteadiness on feet: Secondary | ICD-10-CM | POA: Insufficient documentation

## 2023-04-15 DIAGNOSIS — R42 Dizziness and giddiness: Secondary | ICD-10-CM | POA: Insufficient documentation

## 2023-04-15 DIAGNOSIS — M6281 Muscle weakness (generalized): Secondary | ICD-10-CM | POA: Insufficient documentation

## 2023-04-15 NOTE — Therapy (Addendum)
OUTPATIENT PHYSICAL THERAPY CERVICAL SPINE TREATMENT- 10th VISIT PROGRESS NOTE     Patient Name: Alexander Parker MRN: [id number] DOB:1961/04/03, 62 y.o., male Today's Date: 04/15/2023  Physical Therapy Progress Note   Dates of Reporting Period:03/11/23-04/15/23  See Note below for Objective Data and Assessment of Progress/Goals.  Thank you for the referral of this patient. Beverely Low, SPT   END OF SESSION:  PT End of Session - 04/15/23 1146     Visit Number 10    Number of Visits 12    Date for PT Re-Evaluation 05/10/23    Authorization Type WC    Authorization - Number of Visits 12    PT Start Time 1146    PT Stop Time 1241    PT Time Calculation (min) 55 min    Activity Tolerance Patient tolerated treatment well    Behavior During Therapy WFL for tasks assessed/performed                Past Medical History:  Diagnosis Date   Diabetes mellitus without complication (HCC)    Hyperlipidemia    Hypertension    Past Surgical History:  Procedure Laterality Date   CIRCUMCISION     Patient Active Problem List   Diagnosis Date Noted   TBI (traumatic brain injury) (HCC) 02/16/2023   Subdural hematoma caused by concussion (HCC) 02/15/2023   Uncontrolled type 2 diabetes mellitus with hyperglycemia (HCC) 07/04/2018   Pure hypercholesterolemia 06/23/2018   HTN (hypertension) 12/23/2011   Diabetes mellitus (HCC) 07/29/2011    PCP: Barry Brunner, PA-C REFERRING PROVIDER: Tyson Alias, MD  REFERRING DIAG: S06.9X9D (ICD-10-CM) - Traumatic brain injury with loss of consciousness, subsequent encounter  THERAPY DIAG:  Dizziness and giddiness  Muscle weakness (generalized)  Unsteadiness on feet  ONSET DATE: 02/17/2023 referral  Rationale for Evaluation and Treatment: Rehabilitation  SUBJECTIVE:   SUBJECTIVE STATEMENT:  Pt reports he is fine. He feels that the dry needling helped. Has no questions about HEP additions.   Pt accompanied by:  family member and interpreter: Journalist, newspaper ; niece  PERTINENT HISTORY: DM, HLD, HTN, recent BI  PAIN:  Are you having pain? Yes: NPRS scale: 3/10 Pain location: L upper trap Pain description: achy, soreness Aggravating factors: head turns aggravate and also gaze stabilization exercise Relieving factors: heat helps some  PRECAUTIONS: Fall   PATIENT GOALS: "I want my pain and dizziness to get better"  OBJECTIVE:  Note: Objective measures were completed at Evaluation unless otherwise noted.  DIAGNOSTIC FINDINGS: Head CT 02/11/23 IMPRESSION: 1. Scattered subarachnoid hemorrhage along the right Sylvian fissure, bilateral subfrontal sulci, and anterior falx. 2. Probable trace interhemispheric subdural hemorrhage along the falx near the vertex. 3. Linear, nondisplaced fracture of the left occipital bone. 4. No acute cervical spine fracture or traumatic listhesis.   CERVICAL TREATMENT:   TherAct Trigger Point Dry-Needling  Treatment instructions: Expect mild to moderate muscle soreness. S/S of pneumothorax if dry needled over a lung field, and to seek immediate medical attention should they occur. Patient verbalized understanding of these instructions and education.  Patient Consent Given: Yes Education handout provided: Previously provided, in spanish Muscles treated: L upper trapezius, L levator scap Treatment response/outcome: deep ache/pressure; muscle twitch detected    Discussed with pt how to set up his appointment with a neurologist for his brain injury. Pt was told he does not need to see a neurosurgeon, but a neurologist. Educated pt to call PCP for this referral. Gave pt the phone number and address to several neurologists.  TherEx Seated cervical retractions + rotation to L and R x5 repetitions Pt reports feels good + lift off x 10 repetitions, a little bit of soreness at completion  Cat-cow x10 repetitions Pt reports soreness in neck w/ "cow" position, instructed to  not hold positions very long but instead focus on moving through full ROM Pt hypomobile throughout thoracic and lumbar spine  Sternocleidomastoid stretch x30 seconds performed bilaterally Pt reports feeling the stretch  Supine serratus punches 2 sets x10 repetitions performed bilaterally w/ 2# dumbbell  Horizontal abduction 2 sets x10 repetitions w/ 2# dumbbells   PATIENT EDUCATION:  Education details: dry needling, expect soreness, HEP additions, neurologist appointment Person educated: Patient and sister Education method: Explanation, Demonstration, Tactile cues, Verbal cues, and Handouts/  (in [place]) Education comprehension: verbalized understanding, returned demonstration, verbal cues required, tactile cues required, and needs further education  HOME EXERCISE PROGRAM:  Access Code: [email or web address] URL: [email or web address] Date: 03/18/2023 Prepared by: Merry Lofty  Exercises - Seated Gaze Stabilization with Head Rotation  - 1 x daily - 7 x weekly - 3 sets - 30s hold -brandt daroff  -Repeated rolling  For Neck:  Access Code: [email or web address] URL: [email or web address] Date: 04/04/2023 Prepared by: Sherlie Ban  Exercises - Seated Assisted Cervical Rotation with Towel  - 1 x daily - 7 x weekly - 1 sets - 2-3 reps - 15 sec hold - Seated Cervical Sidebending Stretch  - 1 x daily - 7 x weekly - 1 sets - 2-3 reps - 15 sec hold - Seated Levator Scapulae Stretch  - 1 x daily - 5 x weekly - 3 sets - 30 hold - Seated Cervical Retraction  - 1 x daily - 7 x weekly - 3 sets - 10 reps - Seated Shoulder Row with Anchored Resistance  - 1 x daily - 7 x weekly - 3 sets - 10 reps - Seated Upper Trapezius Stretch  - 1 x daily - 7 x weekly - 3 sets - 10 reps - Sternocleidomastoid Stretch  - 1 x daily - 7 x weekly - 3 sets - 10 reps - Scapular Wall Slides  - 1 x daily - 7 x weekly - 3 sets - 10 reps - Cat Cow  - 1 x daily - 7 x weekly - 3 sets - 10 reps  GOALS: Goals  reviewed with patient? Yes  SHORT TERM GOALS: Target date: 04/12/23   Pt will be independent with initial HEP for improved balance and symptoms  Baseline: to be provided Goal status: MET  2.  Patient will demonstrate (-) positional testing to indicate resolution of BPPV  Baseline: negative on 11/7 Goal status: MET  3.  MCTSIB goal Baseline: not needed Goal status: DISCONTINUED  4.  Pt will report </= 2/5 for all movements on MSQ to indicate improvement in motion sensitivity and improved activity tolerance.   Baseline: up to 4/5  Pt with 2/5 dizziness or less with exception of supine to sitting on 11/7 Goal status: PARTIALLY MET   5.  Pt will improve DVA to </= 3 line difference to indicate improved VOR  Baseline: 5 line difference Goal status: REVISED   LONG TERM GOALS: Target date: 05/10/23  Pt will be independent with final HEP for improved balance and symptoms  Baseline: to be provided Goal status: INITIAL  Patient will demonstrate (-) positional testing to indicate resolution of BPPV  Baseline: to be reassessed PRN Goal status: INITIAL  MCTSIB goal Baseline: not needed Goal status: DISCONTINUED  Pt  will report </= 1/5 for all movements on MSQ to indicate improvement in motion sensitivity and improved activity tolerance.   Baseline: up to 4/5 Goal status: REVISED  Pt will improve DVA to </= 2 line difference to indicate improved VOR  Baseline: 5 line difference Goal status: REVISED  6. Patient will score >/= 60 on FOTO to demonstrate improved symptoms  Baseline: 50  Goal status: INITIAL  ASSESSMENT:  CLINICAL IMPRESSION:  Emphasis of skilled PT session on dry needling for shoulder tightness and pain as well as stretches and strengthening exercises for neck and scapular muscles. Pt with good response to dry needling and strengthening exercises, no questions about additions to HEP. Continue POC.   OBJECTIVE IMPAIRMENTS: decreased activity tolerance,  decreased balance, decreased knowledge of condition, dizziness, impaired vision/preception, and pain.   ACTIVITY LIMITATIONS: carrying, lifting, stairs, hygiene/grooming, locomotion level, and caring for others  PARTICIPATION LIMITATIONS: meal prep, cleaning, interpersonal relationship, driving, shopping, community activity, occupation, and yard work  PERSONAL FACTORS: Age, Fitness, Past/current experiences, Profession, Transportation, and 3+ comorbidities: see above  are also affecting patient's functional outcome.   REHAB POTENTIAL: Good  CLINICAL DECISION MAKING: Stable/uncomplicated  EVALUATION COMPLEXITY: Low   PLAN:  PT FREQUENCY: 2x/week  PT DURATION: 6 weeks  PLANNED INTERVENTIONS: 97146- PT Re-evaluation, 97110-Therapeutic exercises, 97530- Therapeutic activity, 97112- Neuromuscular re-education, 97535- Self Care, 14782- Manual therapy, [phone]- Gait training, [phone]- Orthotic Fit/training, [phone]- Canalith repositioning, [phone]- Aquatic Therapy, Patient/Family education, Balance training, Stair training, Dry Needling, Vestibular training, Visual/preceptual remediation/compensation, and DME instructions  PLAN FOR NEXT SESSION: response to dry needling? work on gentle neck exercises, additional neck stretches  continue visual/vestibular exercises - reassess DVA, progress VOR, balance with head turns, HART chart, card pickups, faster speed head movements, return to work tasks    [employer], Student-PT Peter Congo, PT, DPT, CSRS     04/15/2023, 1:48 PM

## 2023-04-17 ENCOUNTER — Ambulatory Visit: Payer: Worker's Compensation

## 2023-04-17 DIAGNOSIS — R2681 Unsteadiness on feet: Secondary | ICD-10-CM

## 2023-04-17 DIAGNOSIS — R42 Dizziness and giddiness: Secondary | ICD-10-CM

## 2023-04-17 DIAGNOSIS — M6281 Muscle weakness (generalized): Secondary | ICD-10-CM

## 2023-04-17 NOTE — Therapy (Signed)
OUTPATIENT PHYSICAL THERAPY CERVICAL SPINE TREATMENT- 10th VISIT PROGRESS NOTE     Patient Name: Alexander Parker MRN: [id number] DOB:1960-07-03, 62 y.o., male Today's Date: 04/17/2023  Physical Therapy Progress Note   Dates of Reporting Period:03/11/23-04/15/23  See Note below for Objective Data and Assessment of Progress/Goals.  Thank you for the referral of this patient. Beverely Low, SPT   END OF SESSION:  PT End of Session - 04/17/23 1018     Visit Number 11    Number of Visits 12    Date for PT Re-Evaluation 05/10/23    Authorization Type WC    PT Start Time 1018    PT Stop Time 1100    PT Time Calculation (min) 42 min    Activity Tolerance Patient tolerated treatment well                 Past Medical History:  Diagnosis Date   Diabetes mellitus without complication (HCC)    Hyperlipidemia    Hypertension    Past Surgical History:  Procedure Laterality Date   CIRCUMCISION     Patient Active Problem List   Diagnosis Date Noted   TBI (traumatic brain injury) (HCC) 02/16/2023   Subdural hematoma caused by concussion (HCC) 02/15/2023   Uncontrolled type 2 diabetes mellitus with hyperglycemia (HCC) 07/04/2018   Pure hypercholesterolemia 06/23/2018   HTN (hypertension) 12/23/2011   Diabetes mellitus (HCC) 07/29/2011    PCP: Barry Brunner, PA-C REFERRING PROVIDER: Tyson Alias, MD  REFERRING DIAG: S06.9X9D (ICD-10-CM) - Traumatic brain injury with loss of consciousness, subsequent encounter  THERAPY DIAG:  Dizziness and giddiness  Muscle weakness (generalized)  Unsteadiness on feet  ONSET DATE: 02/17/2023 referral  Rationale for Evaluation and Treatment: Rehabilitation  SUBJECTIVE:   SUBJECTIVE STATEMENT:  Patient said he is doing well. No reports of dizziness over the last couple days. Neck pain is also doing much better. Denied any falls. Back pain is more soreness from doing his exercises than pain. HEP going well.   Pt  accompanied by: family member and interpreter: Journalist, newspaper ; niece  PERTINENT HISTORY: DM, HLD, HTN, recent BI  PAIN:  Are you having pain? Yes: NPRS scale: 0/10 Pain location: Neck/back Pain description: soreness Aggravating factors:   Relieving factors:    PRECAUTIONS: Fall   PATIENT GOALS: "I want my pain and dizziness to get better"  OBJECTIVE:  Note: Objective measures were completed at Evaluation unless otherwise noted.  DIAGNOSTIC FINDINGS: Head CT 02/11/23 IMPRESSION: 1. Scattered subarachnoid hemorrhage along the right Sylvian fissure, bilateral subfrontal sulci, and anterior falx. 2. Probable trace interhemispheric subdural hemorrhage along the falx near the vertex. 3. Linear, nondisplaced fracture of the left occipital bone. 4. No acute cervical spine fracture or traumatic listhesis.   CERVICAL TREATMENT:   - sidelying open books bilat   - neck and trunk rotation to increase mobility   - cat/ cow   - neck and trunk flex/ext to increase mobility   - childs pose/ down dog  - neck and trunk flex/ ext to increase mobility   - quadruped Y's and T's   - scapular strengthening   - rows w/ blue band   - postural strengthening   - balance:  - rocker board A/P plane  - head nods/ head turns   - single leg stance   - 6 lb med ball rotations    PATIENT EDUCATION:  Education details: dry needling, expect soreness, HEP additions, neurologist appointment Person educated: Patient and sister Education method: Explanation,  Demonstration, Tactile cues, Verbal cues, and Handouts/  (in [place]) Education comprehension: verbalized understanding, returned demonstration, verbal cues required, tactile cues required, and needs further education  HOME EXERCISE PROGRAM:  Access Code: [email or web address] URL: [email or web address] Date: 03/18/2023 Prepared by: Merry Lofty  Exercises - Seated Gaze Stabilization with Head Rotation  - 1 x daily - 7 x weekly - 3 sets -  30s hold -brandt daroff  -Repeated rolling  For Neck:  Access Code: [email or web address] URL: [email or web address] Date: 04/04/2023 Prepared by: Sherlie Ban  Exercises - Seated Assisted Cervical Rotation with Towel  - 1 x daily - 7 x weekly - 1 sets - 2-3 reps - 15 sec hold - Seated Cervical Sidebending Stretch  - 1 x daily - 7 x weekly - 1 sets - 2-3 reps - 15 sec hold - Seated Levator Scapulae Stretch  - 1 x daily - 5 x weekly - 3 sets - 30 hold - Seated Cervical Retraction  - 1 x daily - 7 x weekly - 3 sets - 10 reps - Seated Shoulder Row with Anchored Resistance  - 1 x daily - 7 x weekly - 3 sets - 10 reps - Seated Upper Trapezius Stretch  - 1 x daily - 7 x weekly - 3 sets - 10 reps - Sternocleidomastoid Stretch  - 1 x daily - 7 x weekly - 3 sets - 10 reps - Scapular Wall Slides  - 1 x daily - 7 x weekly - 3 sets - 10 reps - Cat Cow  - 1 x daily - 7 x weekly - 3 sets - 10 reps  GOALS: Goals reviewed with patient? Yes  SHORT TERM GOALS: Target date: 04/12/23   Pt will be independent with initial HEP for improved balance and symptoms  Baseline: to be provided Goal status: MET  2.  Patient will demonstrate (-) positional testing to indicate resolution of BPPV  Baseline: negative on 11/7 Goal status: MET  3.  MCTSIB goal Baseline: not needed Goal status: DISCONTINUED  4.  Pt will report </= 2/5 for all movements on MSQ to indicate improvement in motion sensitivity and improved activity tolerance.   Baseline: up to 4/5  Pt with 2/5 dizziness or less with exception of supine to sitting on 11/7 Goal status: PARTIALLY MET   5.  Pt will improve DVA to </= 3 line difference to indicate improved VOR  Baseline: 5 line difference Goal status: REVISED   LONG TERM GOALS: Target date: 05/10/23  Pt will be independent with final HEP for improved balance and symptoms  Baseline: to be provided Goal status: INITIAL  Patient will demonstrate (-) positional testing to  indicate resolution of BPPV  Baseline: to be reassessed PRN Goal status: INITIAL  MCTSIB goal Baseline: not needed Goal status: DISCONTINUED  Pt will report </= 1/5 for all movements on MSQ to indicate improvement in motion sensitivity and improved activity tolerance.   Baseline: up to 4/5 Goal status: REVISED  Pt will improve DVA to </= 2 line difference to indicate improved VOR  Baseline: 5 line difference Goal status: REVISED  6. Patient will score >/= 60 on FOTO to demonstrate improved symptoms  Baseline: 50  Goal status: INITIAL  ASSESSMENT:  CLINICAL IMPRESSION:  Patient seen for skilled physical therapy session with emphasis on neck and back stretching and strengthening. Patient tolerated all of the stretches well and would benefit from continuing to stretch at home as his thoracic and lumbar spine have limited mobility. Y's and T's demonstrated  decreased scapular strength. Balance is improving and no increase in dizziness or headache throughout session. Continue POC.   OBJECTIVE IMPAIRMENTS: decreased activity tolerance, decreased balance, decreased knowledge of condition, dizziness, impaired vision/preception, and pain.   ACTIVITY LIMITATIONS: carrying, lifting, stairs, hygiene/grooming, locomotion level, and caring for others  PARTICIPATION LIMITATIONS: meal prep, cleaning, interpersonal relationship, driving, shopping, community activity, occupation, and yard work  PERSONAL FACTORS: Age, Fitness, Past/current experiences, Profession, Transportation, and 3+ comorbidities: see above  are also affecting patient's functional outcome.   REHAB POTENTIAL: Good  CLINICAL DECISION MAKING: Stable/uncomplicated  EVALUATION COMPLEXITY: Low   PLAN:  PT FREQUENCY: 2x/week  PT DURATION: 6 weeks  PLANNED INTERVENTIONS: 97146- PT Re-evaluation, 97110-Therapeutic exercises, 97530- Therapeutic activity, 97112- Neuromuscular re-education, 97535- Self Care, [id number]- Manual  therapy, [phone]- Gait training, [phone]- Orthotic Fit/training, [phone]- Canalith repositioning, [phone]- Aquatic Therapy, Patient/Family education, Balance training, Stair training, Dry Needling, Vestibular training, Visual/preceptual remediation/compensation, and DME instructions  PLAN FOR NEXT SESSION: response to dry needling? work on gentle neck exercises, additional neck stretches  continue visual/vestibular exercises - reassess DVA, progress VOR, balance with head turns, HART chart, card pickups, faster speed head movements, return to work tasks   [employer], SPT 04/17/2023, 1:05 PM

## 2023-04-22 ENCOUNTER — Telehealth: Payer: Self-pay | Admitting: Neurology

## 2023-04-22 ENCOUNTER — Encounter: Payer: Self-pay | Admitting: Neurology

## 2023-04-22 ENCOUNTER — Ambulatory Visit: Payer: Worker's Compensation

## 2023-04-22 DIAGNOSIS — R2681 Unsteadiness on feet: Secondary | ICD-10-CM

## 2023-04-22 DIAGNOSIS — R42 Dizziness and giddiness: Secondary | ICD-10-CM

## 2023-04-22 DIAGNOSIS — M6281 Muscle weakness (generalized): Secondary | ICD-10-CM

## 2023-04-22 NOTE — Therapy (Signed)
OUTPATIENT PHYSICAL THERAPY CERVICAL SPINE TREATMENT - DISCHARGE SUMMARY      Patient Name: Alexander Parker MRN: [id number] DOB:1961/04/01, 62 y.o., male Today's Date: 04/22/2023  PHYSICAL THERAPY DISCHARGE SUMMARY  Visits from Start of Care: 12  Current functional level related to goals / functional outcomes: Met all Long Term Goals    Remaining deficits: Neck pain    Education / Equipment: Continue HEP    Patient agrees to discharge. Patient goals were met. Patient is being discharged due to meeting the stated rehab goals.    END OF SESSION:  PT End of Session - 04/22/23 1015     Visit Number 12    Number of Visits 12    Date for PT Re-Evaluation 05/10/23    Authorization Type WC    PT Start Time 1015    PT Stop Time 1041    PT Time Calculation (min) 26 min    Activity Tolerance Patient tolerated treatment well                 Past Medical History:  Diagnosis Date   Diabetes mellitus without complication (HCC)    Hyperlipidemia    Hypertension    Past Surgical History:  Procedure Laterality Date   CIRCUMCISION     Patient Active Problem List   Diagnosis Date Noted   TBI (traumatic brain injury) (HCC) 02/16/2023   Subdural hematoma caused by concussion (HCC) 02/15/2023   Uncontrolled type 2 diabetes mellitus with hyperglycemia (HCC) 07/04/2018   Pure hypercholesterolemia 06/23/2018   HTN (hypertension) 12/23/2011   Diabetes mellitus (HCC) 07/29/2011    PCP: Barry Brunner, PA-C REFERRING PROVIDER: Tyson Alias, MD  REFERRING DIAG: S06.9X9D (ICD-10-CM) - Traumatic brain injury with loss of consciousness, subsequent encounter  THERAPY DIAG:  Dizziness and giddiness  Unsteadiness on feet  Muscle weakness (generalized)  ONSET DATE: 02/17/2023 referral  Rationale for Evaluation and Treatment: Rehabilitation  SUBJECTIVE:   SUBJECTIVE STATEMENT:  Patient said he is doing good. Yesterday had a headache, no headache at time of  session. Denied falls. Patient agreeable to discharge.   Pt accompanied by: family member and interpreter: Journalist, newspaper ; niece  PERTINENT HISTORY: DM, HLD, HTN, recent BI  PAIN:  Are you having pain? Yes: NPRS scale: 0/10 Pain location: Neck/back Pain description: soreness Aggravating factors:   Relieving factors:    PRECAUTIONS: Fall   PATIENT GOALS: "I want my pain and dizziness to get better"  OBJECTIVE:  Note: Objective measures were completed at Evaluation unless otherwise noted.  DIAGNOSTIC FINDINGS: Head CT 02/11/23 IMPRESSION: 1. Scattered subarachnoid hemorrhage along the right Sylvian fissure, bilateral subfrontal sulci, and anterior falx. 2. Probable trace interhemispheric subdural hemorrhage along the falx near the vertex. 3. Linear, nondisplaced fracture of the left occipital bone. 4. No acute cervical spine fracture or traumatic listhesis.   CERVICAL TREATMENT:   Check LTGs for D/C   Motion Sensitivity Quotient  Intensity: 0 = none, 1 = Lightheaded, 2 = Mild, 3 = Moderate, 4 = Severe, 5 = Vomiting   Intensity  1. Sitting to supine 0  2. Supine to L side 0  3. Supine to R side 0  4. Supine to sitting 0  5. L Hallpike-Dix Not Indicated   6. Up from L  0  7. R Hallpike-Dix Not Indicated   8. Up from R  0  9. Sitting, head  tipped to L knee 0  10. Head up from L  knee 0  11. Sitting, head  tipped  to R knee 0  12. Head up from R  knee 0  13. Sitting head turns x5 0  14.Sitting head nods x5 0  15. In stance, 180  turn to L  0  16. In stance, 180  turn to R 0      PATIENT EDUCATION:  Education details: dry needling, expect soreness, HEP additions, neurologist appointment Person educated: Patient and sister Education method: Explanation, Demonstration, Tactile cues, Verbal cues, and Handouts/  (in [place]) Education comprehension: verbalized understanding, returned demonstration, verbal cues required, tactile cues required, and needs further  education  HOME EXERCISE PROGRAM:  Access Code: [email or web address] URL: [email or web address] Date: 03/18/2023 Prepared by: Merry Lofty  Exercises - Seated Gaze Stabilization with Head Rotation  - 1 x daily - 7 x weekly - 3 sets - 30s hold -brandt daroff  -Repeated rolling  For Neck:  Access Code: [email or web address] URL: [email or web address] Date: 04/04/2023 Prepared by: Sherlie Ban  Exercises - Seated Assisted Cervical Rotation with Towel  - 1 x daily - 7 x weekly - 1 sets - 2-3 reps - 15 sec hold - Seated Cervical Sidebending Stretch  - 1 x daily - 7 x weekly - 1 sets - 2-3 reps - 15 sec hold - Seated Levator Scapulae Stretch  - 1 x daily - 5 x weekly - 3 sets - 30 hold - Seated Cervical Retraction  - 1 x daily - 7 x weekly - 3 sets - 10 reps - Seated Shoulder Row with Anchored Resistance  - 1 x daily - 7 x weekly - 3 sets - 10 reps - Seated Upper Trapezius Stretch  - 1 x daily - 7 x weekly - 3 sets - 10 reps - Sternocleidomastoid Stretch  - 1 x daily - 7 x weekly - 3 sets - 10 reps - Scapular Wall Slides  - 1 x daily - 7 x weekly - 3 sets - 10 reps - Cat Cow  - 1 x daily - 7 x weekly - 3 sets - 10 reps  GOALS: Goals reviewed with patient? Yes  SHORT TERM GOALS: Target date: 04/12/23   Pt will be independent with initial HEP for improved balance and symptoms  Baseline: to be provided Goal status: MET  2.  Patient will demonstrate (-) positional testing to indicate resolution of BPPV  Baseline: negative on 11/7 Goal status: MET  3.  MCTSIB goal Baseline: not needed Goal status: DISCONTINUED  4.  Pt will report </= 2/5 for all movements on MSQ to indicate improvement in motion sensitivity and improved activity tolerance.   Baseline: up to 4/5  Pt with 2/5 dizziness or less with exception of supine to sitting on 11/7 Goal status: PARTIALLY MET   5.  Pt will improve DVA to </= 3 line difference to indicate improved VOR  Baseline: 5 line  difference Goal status: REVISED   LONG TERM GOALS: Target date: 05/10/23  Pt will be independent with final HEP for improved balance and symptoms  Baseline: to be provided 04/22/23: verbalized independent  Goal status: MET   Patient will demonstrate (-) positional testing to indicate resolution of BPPV  Baseline: to be reassessed PRN 04/22/23: verbalized no dizziness  Goal status: MET  MCTSIB goal Baseline: not needed Goal status: DISCONTINUED  Pt will report </= 1/5 for all movements on MSQ to indicate improvement in motion sensitivity and improved activity tolerance.   Baseline: up to 4/5 04/22/23: 0/5 for all movements  Goal status: MET   Pt will  improve DVA to </= 2 line difference to indicate improved VOR  Baseline: 5 line difference 04/22/23: 3 line difference  Goal status: PARTIALLY MET   6. Patient will score >/= 60 on FOTO to demonstrate improved symptoms  Baseline: 50 04/19/23: 70  Goal status: MET    ASSESSMENT:  CLINICAL IMPRESSION:  Patient seen for skilled physical therapy session with emphasis checking Long Term Goals for Discharge from Therapy. Patient met all of his goals and did not have any dizziness or headaches during session. Educated patient on continuing his HEP for improved neck pain and decreased frequency of headaches. Patient agreeable to discharge at this time.   OBJECTIVE IMPAIRMENTS: decreased activity tolerance, decreased balance, decreased knowledge of condition, dizziness, impaired vision/preception, and pain.   ACTIVITY LIMITATIONS: carrying, lifting, stairs, hygiene/grooming, locomotion level, and caring for others  PARTICIPATION LIMITATIONS: meal prep, cleaning, interpersonal relationship, driving, shopping, community activity, occupation, and yard work  PERSONAL FACTORS: Age, Fitness, Past/current experiences, Profession, Transportation, and 3+ comorbidities: see above  are also affecting patient's functional outcome.   REHAB  POTENTIAL: Good  CLINICAL DECISION MAKING: Stable/uncomplicated  EVALUATION COMPLEXITY: Low   PLAN:  PT FREQUENCY: 2x/week  PT DURATION: 6 weeks  PLANNED INTERVENTIONS: 97146- PT Re-evaluation, 97110-Therapeutic exercises, 97530- Therapeutic activity, 97112- Neuromuscular re-education, 97535- Self Care, 16109- Manual therapy, [phone]- Gait training, [phone]- Orthotic Fit/training, [phone]- Canalith repositioning, [id number]- Aquatic Therapy, Patient/Family education, Balance training, Stair training, Dry Needling, Vestibular training, Visual/preceptual remediation/compensation, and DME instructions    Bradd Burner Clarnce Homan, SPT 04/22/2023, 10:43 AM

## 2023-06-05 NOTE — Progress Notes (Signed)
Initial neurology clinic note  Dietrich Maran MRN: [id number] DOB: 01/27/1961  Referring provider: Sherren Mocha*  Primary care provider: Sandre Kitty, PA-C  Reason for consult:  traumatic subdural hematoma  Subjective:  This is Mr. Azarel Mcniff, a 63 y.o. right-handed male with a medical history of DM2, OA, HTN, HLD who presents to neurology clinic with sequela of traumatic subdural hematoma. The patient is accompanied by sister, wife, and Spanish interpreter. History was aided by [place] interpreter (in person).  Patient fell from 6 foot ladder backwards and hit head on 02/11/23. He lost consciousness. When he came to, he was very dizzy. He went to ED and then represented to ED on 02/15/20 and admitted until 02/17/23 due to worsening headache and vomiting. He had a SDH and SAH and occipital skull fracture. The SDH was deemed to be small and not needing intervention per NSGY. He was diagnosed with concussion. He went to physical therapy which finished on 04/22/23. He continues to do home exercises and stays active when not having symptoms.  He has improved, but still has occasional symptoms. He will feel "drunk" when he wakes up sometimes, with no balance. He will feel like the room is spinning. It also hurts on the back of his head. He last had symptoms about 1 week ago. He last had nausea a month ago. He denies photophobia. When he has an episode, he will not be able to do anything or go to work. He has had about 3 episodes since Christmas. It can last minutes or hours. He will sometimes have some head pain - left occipital region. He will sometimes get pain in the head when bending down as well.  He does not use alcohol, smoke.  He does not have symptoms today.   MEDICATIONS:  Outpatient Encounter Medications as of 06/14/2023  Medication Sig Note   ACCU-CHEK GUIDE test strip USE UP TO 3 TIMES DAILY AS DIRECTED    Accu-Chek Softclix Lancets lancets USE UP TO 3 TIMES DAILY  AS DIRECTED    acetaminophen (TYLENOL) 500 MG tablet Take 2 tablets (1,000 mg total) by mouth 3 (three) times daily. (Patient taking differently: Take 1,000 mg by mouth as needed.)    atorvastatin (LIPITOR) 40 MG tablet TAKE ONE TABLET BY MOUTH ONCE DAILY FOR CHOLESTEROL    blood glucose meter kit and supplies KIT Per insurance preference. Use up to three times daily as directed. Dx E11.65, Z79.8.    glipiZIDE (GLUCOTROL) 10 MG tablet Take 1 tablet by mouth 2 (two) times daily.    Insulin Pen Needle (PEN NEEDLES) 32G X 6 MM MISC 1 each by Does not apply route daily. 07/22/2018: Insulin was discontinued per primary care provider, Dr. Leretha Pol, yesterday per patient and chart review.   JARDIANCE 25 MG TABS tablet Take 25 mg by mouth daily.    lisinopril (ZESTRIL) 20 MG tablet Take 1 tablet by mouth once daily    meclizine (ANTIVERT) 25 MG tablet Take 1 tablet (25 mg total) by mouth 3 (three) times daily as needed for dizziness.    metFORMIN (GLUCOPHAGE) 1000 MG tablet TAKE 1 TABLET BY MOUTH TWICE DAILY WITH A MEAL (Patient taking differently: Take 1,000 mg by mouth 2 (two) times daily with a meal.)    tamsulosin (FLOMAX) 0.4 MG CAPS capsule Take 1 capsule (0.4 mg total) by mouth daily. 02/11/2023: Patient mentioned that he was told to stop taking it, but I was unable to confirm it using recent office visits.  Vibegron (GEMTESA) 75 MG TABS Take 1 tablet by mouth daily.    methocarbamol (ROBAXIN) 500 MG tablet Take 1 tablet (500 mg total) by mouth 2 (two) times daily. (Patient not taking: Reported on 06/14/2023)    No facility-administered encounter medications on file as of 06/14/2023.    PAST MEDICAL HISTORY: Past Medical History:  Diagnosis Date   Diabetes mellitus without complication (HCC)    Hyperlipidemia    Hypertension     PAST SURGICAL HISTORY: Past Surgical History:  Procedure Laterality Date   CIRCUMCISION      ALLERGIES: No Known Allergies  FAMILY HISTORY: Family History   Problem Relation Age of Onset   Diabetes Mother    Diabetes Father    Esophageal cancer Maternal Uncle    Colon cancer Neg Hx    Colon polyps Neg Hx    Rectal cancer Neg Hx    Stomach cancer Neg Hx     SOCIAL HISTORY: Social History   Tobacco Use   Smoking status: Never   Smokeless tobacco: Never  Vaping Use   Vaping status: Never Used  Substance Use Topics   Alcohol use: No    Alcohol/week: 0.0 standard drinks of alcohol   Drug use: No   Social History   Social History Narrative   Are you right handed or left handed? Right   Are you currently employed ? no   What is your current occupation? Out of work   Do you live at home alone?   Who lives with you? family   What type of home do you live in: 1 story or 2 story? Two     Caffiene 1 weekly    Objective:  Vital Signs:  BP 122/84 (Cuff Size: Normal)   Pulse 85   Ht 5\' 3"  (1.6 m)   Wt 149 lb (67.6 kg)   SpO2 97%   BMI 26.39 kg/m   General: No acute distress.  Patient appears well-groomed.   Head:  Normocephalic/atraumatic Eyes:  fundi examined, disc margins clear, no obvious papilledema Neck: supple, positive for left sided paraspinal tenderness, reduced range of motion Heart: regular rate and rhythm Lungs: Clear to auscultation bilaterally. Vascular: No carotid bruits.  Neurological Exam: Mental status: alert and oriented, speech fluent and not dysarthric, language intact.  Cranial nerves: CN I: not tested CN II: pupils equal, round and reactive to light, visual fields intact CN III, IV, VI:  full range of motion, no nystagmus, no ptosis CN V: facial sensation intact. CN VII: upper and lower face symmetric CN VIII: hearing intact CN IX, X: uvula midline CN XI: sternocleidomastoid and trapezius muscles intact CN XII: tongue midline  Bulk & Tone: normal, no fasciculations. Motor:  muscle strength 5/5 throughout Deep Tendon Reflexes:  2+ throughout.   Sensation:  Pinpricksensation intact. Finger to  nose testing:  Without dysmetria.   Coordination: RAM normal. Gait:  Normal station and stride.  Romberg negative.   Labs and Imaging review: Internal labs: Lab Results  Component Value Date   HGBA1C 6.8 (H) 02/15/2023   No results found for: "VITAMINB12" Lab Results  Component Value Date   TSH 1.390 05/11/2019   02/21/23: CBC unremarkable BMP significant for Na 132, glucose 109  Imaging: CT head and cervical spine wo contrast (02/11/23): FINDINGS: CT HEAD FINDINGS   Brain: Scattered subarachnoid hemorrhage along the right sylvian fissure, bilateral subfrontal sulci, and anterior falx. Probable trace interhemispheric subdural hemorrhage along the falx near the vertex (axial image 26 series  2). Gray-white differentiation is preserved. No hydrocephalus, mass effect or midline shift.   Vascular: No hyperdense vessel or unexpected calcification.   Skull: Linear, nondisplaced fracture of the left occipital bone.   Sinuses/Orbits: No acute findings.   Other: None.   CT CERVICAL SPINE FINDINGS   Alignment: Normal.   Skull base and vertebrae: No acute cervical spine fracture. Craniocervical junction is intact.   Soft tissues and spinal canal: No prevertebral fluid or swelling. No visible canal hematoma.   Disc levels: Multilevel cervical spondylosis, worst at C4-5, where there is at least mild spinal canal stenosis.   Upper chest: No acute findings.   Other: None.   IMPRESSION: 1. Scattered subarachnoid hemorrhage along the right Sylvian fissure, bilateral subfrontal sulci, and anterior falx. 2. Probable trace interhemispheric subdural hemorrhage along the falx near the vertex. 3. Linear, nondisplaced fracture of the left occipital bone. 4. No acute cervical spine fracture or traumatic listhesis.  CT head wo contrast (02/12/23): FINDINGS: Brain: Redemonstrated subarachnoid hemorrhage along the right sylvian fissure, in the right frontal sulci along the anterior  falx, and along the inferior frontal lobes bilaterally. Small amount of subarachnoid hemorrhage in the right parietal sulci (series 4, image 22 in series 6, image 28) is new from prior exam.   No significant mass effect or midline shift. No hydrocephalus or mass. No evidence of acute infarct.   Vascular: No hyperdense vessel.   Skull: Redemonstrated nondisplaced fracture of the left occipital bone.   Sinuses/Orbits: No acute finding.   Other: None.   IMPRESSION: Redemonstrated subarachnoid hemorrhage along the right Sylvian fissure, right frontal sulci along the anterior falx, and along the inferior frontal lobes bilaterally. Small amount of subarachnoid hemorrhage in the right parietal sulci is new from prior exam. No significant mass effect or midline shift.  CT head wo contrast (02/15/23): FINDINGS: Brain: Interval resolution of the previously seen subarachnoid hemorrhage in the right sylvian fissure and along the right cerebral convexity. Small amount of residual subarachnoid blood products remains along the inferior medial left frontal lobe (series 5, image 53). Compared to prior exam there is a new 7 mm low-density subdural fluid collection along the left cerebral convexity. No hydrocephalus. No extra-axial fluid collection. No CT evidence of an cortical infarct. No midline shift. No mass effect   Vascular: No hyperdense vessel or unexpected calcification.   Skull: Normal. Negative for fracture or focal lesion. 6 mm peripherally calcified lesion in the left face, favored to represent epidermal inclusion cyst.   Sinuses/Orbits: No middle ear or mastoid effusion. Paranasal sinuses are clear. Orbits are unremarkable.   Other: None.   IMPRESSION: 1. Compared to prior exam there is a new 7 mm low-density subdural fluid collection along the left cerebral convexity, which may represent a chronic subdural hematoma or hygroma. No midline shift. Recommend short term  follow up head CT to ensure stability. 2. Interval resolution of the previously seen subarachnoid hemorrhage in the right Sylvian fissure and along the right cerebral convexity. Small amount of residual subarachnoid hemorrhage remains along the inferior medial left frontal lobe.  CT head wo contrast (02/16/23): FINDINGS: Brain: Low-density subdural collection along the left frontal convexity not seen on admission head CT, to 4 mm in thickness. Trace subarachnoid hemorrhage best seen at the right parasagittal parietal convexity, non progressed. No new hemorrhage or brain swelling. No hydrocephalus or shift   Vascular: No hyperdense vessel or unexpected calcification.   Skull: Nondepressed left occipital bone fracture, known.   Sinuses/Orbits: No  acute finding.   IMPRESSION: 1. A small subdural hygroma along the left frontal convexity is non progressed. No significant mass effect. 2. No progression of trace remaining subarachnoid hemorrhage.  CT lumbar spine wo contrast (02/21/23): FINDINGS: Segmentation: 5 lumbar type vertebrae.   Alignment: There is mild anterolisthesis at L5-S1.   Vertebrae: No acute fracture or focal pathologic process.   Paraspinal and other soft tissues: No acute abnormality. Aortic atherosclerosis.   Disc levels:   L1-L2: There are degenerative endplate changes and facet arthropathy with no significant spinal canal or neural foraminal stenosis.   L2-L3: There is disc bulge with degenerative endplate changes and facet arthropathy. No significant spinal canal or neural foraminal stenosis.   L3-L4: There is a disc bulge with degenerative endplate changes and facet arthropathy with no significant spinal canal or neural foraminal stenosis.   L4-L5: There is bulging of the disc with degenerative endplate changes and facet arthropathy resulting in mild spinal canal stenosis. The neural foramina are within normal limits.   L5-S1: There is a disc bulge  with degenerative endplate changes and mild facet arthropathy. No significant spinal canal or neural foraminal stenosis.   IMPRESSION: 1. No acute fracture. 2. Multilevel degenerative disc disease, degenerative endplate changes, and facet arthropathy.   Assessment/Plan:  Samson Market is a 63 y.o. male who presents for evaluation of dizziness, nausea, and headaches after fall with left occipital skull fracture, SDH, and SAH. He has a relevant medical history of DM2, OA, HTN, HLD. His neurological examination is essentially normal today. Patient's symptoms are most consistent with post concussive syndrome. Luckily, he has greatly improved since his fall. There is no evidence of neurologic deficits on examination. I explained the natural history of concussion to patient and his family today. I expect him to continue to improve with exercise, rest/no repeat trauma, and time. I explained that symptoms can sometimes linger for many months, but should continue to slowly improve.  PLAN: -Meclizine 25 mg TID for episodes of vertigo/imbalance -Would have low threshold to repeat CT head if symptoms worsen or do not continue to improve -Warning signs discussed; patient to call with new or worsening symptoms or go to nearest ED with severe symptoms   -Return to clinic in 3 months  The impression above as well as the plan as outlined below were extensively discussed with the patient (in the company of wife and sister) who voiced understanding. All questions were answered to their satisfaction.  When available, results of the above investigations and possible further recommendations will be communicated to the patient via telephone/MyChart. Patient to call office if not contacted after expected testing turnaround time.   Total time spent reviewing records, interview, history/exam, documentation, and coordination of care on day of encounter:  55 min   Thank you for allowing me to participate in  patient's care.  If I can answer any additional questions, I would be pleased to do so.  Jacquelyne Balint, MD   CC: Sandre Kitty, PA-C [address] [place] [address]  CC: Referring provider: Sherren Mocha, MD [address] [place],  [place] [address]

## 2023-06-14 ENCOUNTER — Encounter: Payer: Self-pay | Admitting: Neurology

## 2023-06-14 ENCOUNTER — Ambulatory Visit: Payer: BC Managed Care – PPO | Admitting: Neurology

## 2023-06-14 VITALS — BP 122/84 | HR 85 | Ht 63.0 in | Wt 149.0 lb

## 2023-06-14 DIAGNOSIS — I609 Nontraumatic subarachnoid hemorrhage, unspecified: Secondary | ICD-10-CM

## 2023-06-14 DIAGNOSIS — S02119A Unspecified fracture of occiput, initial encounter for closed fracture: Secondary | ICD-10-CM

## 2023-06-14 DIAGNOSIS — R2689 Other abnormalities of gait and mobility: Secondary | ICD-10-CM

## 2023-06-14 DIAGNOSIS — S065X1A Traumatic subdural hemorrhage with loss of consciousness of 30 minutes or less, initial encounter: Secondary | ICD-10-CM

## 2023-06-14 DIAGNOSIS — S02119S Unspecified fracture of occiput, sequela: Secondary | ICD-10-CM

## 2023-06-14 DIAGNOSIS — R42 Dizziness and giddiness: Secondary | ICD-10-CM | POA: Diagnosis not present

## 2023-06-14 DIAGNOSIS — S065X1S Traumatic subdural hemorrhage with loss of consciousness of 30 minutes or less, sequela: Secondary | ICD-10-CM

## 2023-06-14 DIAGNOSIS — G44309 Post-traumatic headache, unspecified, not intractable: Secondary | ICD-10-CM

## 2023-06-14 DIAGNOSIS — F0781 Postconcussional syndrome: Secondary | ICD-10-CM | POA: Diagnosis not present

## 2023-06-14 MED ORDER — MECLIZINE HCL 25 MG PO TABS: 25.0000 mg | ORAL_TABLET | Freq: Three times a day (TID) | ORAL | 2 refills | Status: AC | PRN

## 2023-06-14 NOTE — Patient Instructions (Addendum)
I saw you today after your fall. I think your symptoms are consistent with post concussive syndrome from the fall. I expect you will continue to get better with time.  Continue to stay active. Avoid any further trauma to the head. Continue home exercises given by physical therapy.   You can use ice, heat, or tylenol for pain in head and neck.  I am prescribing a medication called meclizine for dizziness if you have more of those symptoms. You use it as needed.  If you have worsening symptoms or do not continue to improve, let me know. Go to the emergency room with any severe symptoms.  I will see you back in clinic in 3 months to check your progress.  The physicians and staff at [facility] are committed to providing excellent care. You may receive a survey requesting feedback about your experience at our office. We strive to receive "very good" responses to the survey questions. If you feel that your experience would prevent you from giving the office a "very good " response, please contact our office to try to remedy the situation. We may be reached at [phone]. Thank you for taking the time out of your busy day to complete the survey.  Spanish translation by [employer]:  Te vi hoy despus de tu cada. Creo que sus sntomas son consistentes con el sndrome posconmocin cerebral causado por la cada. Espero que sigas mejorando con [employer].  Contine mantenindose activo. Evite cualquier traumatismo adicional en la cabeza. Continuar con los ejercicios caseros indicados por la fisioterapia.   Puede utilizar hielo, calor o tylenol para el dolor de cabeza y cuello.  Le estoy recetando un medicamento llamado meclizina para los mareos si tiene ms de esos sntomas. Lo usas segn sea necesario.  Si sus sntomas empeoran o no continan mejorando, hgamelo saber. Acuda a urgencias si presenta algn sntoma grave.  Te ver nuevamente en la clnica en 3 meses para verificar tu  progreso.  Los mdicos y el personal de Adult nurse Neurology estn comprometidos a brindar una atencin excelente. Es posible que reciba una encuesta solicitando comentarios sobre su experiencia en nuestra oficina. Nos esforzamos por recibir respuestas "muy buenas" a las preguntas de la encuesta. Si cree que su experiencia le impedira darle a la oficina una respuesta "muy buena", comunquese con nuestra oficina para intentar remediar la situacin. Nos pueden contactar al [phone]. Gracias por tomarse el tiempo de su ajetreado da para completar la encuesta.   Alexander Balint, MD [facility]

## 2023-06-18 ENCOUNTER — Telehealth: Payer: Self-pay | Admitting: Neurology

## 2023-06-18 NOTE — Telephone Encounter (Signed)
Pt came in wanting to see if Dr. Loleta Chance could write a letter for him to take to work. He will have some restrictions of things he cannot do.

## 2023-06-19 ENCOUNTER — Encounter: Payer: Self-pay | Admitting: Neurology

## 2023-06-19 NOTE — Telephone Encounter (Signed)
Put in mail basket today 06/19/2023-2:21pm

## 2023-06-27 NOTE — Telephone Encounter (Signed)
Pt called after receiving the letter in the mail. He stated the date of the accident on the letter is wrong. The date of his accident was 02/10/23. He would like to pick it up once it is corrected.

## 2023-06-28 ENCOUNTER — Encounter: Payer: Self-pay | Admitting: Neurology

## 2023-06-28 NOTE — Progress Notes (Addendum)
 Opened in error

## 2023-06-28 NOTE — Telephone Encounter (Signed)
Pt came in to pick up the letter and stated the date of his accident is 02/11/23. I verified the date and had him write it down to triple check it is correct. He is happy to come pick it up once it is corrected.

## 2023-06-28 NOTE — Telephone Encounter (Signed)
Called pt he will come by and pick up.

## 2023-06-28 NOTE — Telephone Encounter (Signed)
Called and made pt aware of paper ready and at front desk. 06/28/23

## 2023-08-02 NOTE — Progress Notes (Signed)
 NEUROLOGY FOLLOW UP OFFICE NOTE  Alexander Parker [id number]  Subjective:  Alexander Parker is a 63 y.o. year old right-handed male with a medical history of DM2, OA, HTN, HLD who we last saw on 06/14/23 for vertigo after traumatic SDH.  To briefly review: Patient fell from 6 foot ladder backwards and hit head on 02/11/23. He lost consciousness. When he came to, he was very dizzy. He went to ED and then represented to ED on 02/15/20 and admitted until 02/17/23 due to worsening headache and vomiting. He had a SDH and SAH and occipital skull fracture. The SDH was deemed to be small and not needing intervention per NSGY. He was diagnosed with concussion. He went to physical therapy which finished on 04/22/23. He continues to do home exercises and stays active when not having symptoms.   He has improved, but still has occasional symptoms. He will feel "drunk" when he wakes up sometimes, with no balance. He will feel like the room is spinning. It also hurts on the back of his head. He last had symptoms about 1 week ago. He last had nausea a month ago. He denies photophobia. When he has an episode, he will not be able to do anything or go to work. He has had about 3 episodes since Christmas. It can last minutes or hours. He will sometimes have some head pain - left occipital region. He will sometimes get pain in the head when bending down as well.   He does not use alcohol, smoke.   He does not have symptoms today.   Most recent Assessment and Plan (06/14/23): Alexander Parker is a 63 y.o. male who presents for evaluation of dizziness, nausea, and headaches after fall with left occipital skull fracture, SDH, and SAH. He has a relevant medical history of DM2, OA, HTN, HLD. His neurological examination is essentially normal today. Patient's symptoms are most consistent with post concussive syndrome. Luckily, he has greatly improved since his fall. There is no evidence of neurologic deficits on examination. I  explained the natural history of concussion to patient and his family today. I expect him to continue to improve with exercise, rest/no repeat trauma, and time. I explained that symptoms can sometimes linger for many months, but should continue to slowly improve.   PLAN: -Meclizine 25 mg TID for episodes of vertigo/imbalance -Would have low threshold to repeat CT head if symptoms worsen or do not continue to improve -Warning signs discussed; patient to call with new or worsening symptoms or go to nearest ED with severe symptoms  Since their last visit: Patient continues to feel better. He rarely gets room spinning. He has neck pain, left more than right side. Moving the neck still causes discomfort, but this continues to improve. He denies significant headaches though. Tylenol helps with the neck pain. He continues to do home exercises given by therapy.   He has no new complaints today.  MEDICATIONS:  Outpatient Encounter Medications as of 08/08/2023  Medication Sig Note   ACCU-CHEK GUIDE test strip USE UP TO 3 TIMES DAILY AS DIRECTED    Accu-Chek Softclix Lancets lancets USE UP TO 3 TIMES DAILY AS DIRECTED    acetaminophen (TYLENOL) 500 MG tablet Take 2 tablets (1,000 mg total) by mouth 3 (three) times daily. (Patient taking differently: Take 1,000 mg by mouth as needed.)    atorvastatin (LIPITOR) 40 MG tablet TAKE ONE TABLET BY MOUTH ONCE DAILY FOR CHOLESTEROL    blood glucose meter kit and supplies KIT  Per insurance preference. Use up to three times daily as directed. Dx E11.65, Z79.8.    glipiZIDE (GLUCOTROL) 10 MG tablet Take 1 tablet by mouth 2 (two) times daily.    Insulin Pen Needle (PEN NEEDLES) 32G X 6 MM MISC 1 each by Does not apply route daily. 07/22/2018: Insulin was discontinued per primary care provider, Dr. Leretha Pol, yesterday per patient and chart review.   JARDIANCE 25 MG TABS tablet Take 25 mg by mouth daily.    lisinopril (ZESTRIL) 20 MG tablet Take 1 tablet by mouth once  daily    meclizine (ANTIVERT) 25 MG tablet Take 1 tablet (25 mg total) by mouth 3 (three) times daily as needed for dizziness.    metFORMIN (GLUCOPHAGE) 1000 MG tablet TAKE 1 TABLET BY MOUTH TWICE DAILY WITH A MEAL (Patient taking differently: Take 1,000 mg by mouth 2 (two) times daily with a meal.)    tamsulosin (FLOMAX) 0.4 MG CAPS capsule Take 1 capsule (0.4 mg total) by mouth daily. 02/11/2023: Patient mentioned that he was told to stop taking it, but I was unable to confirm it using recent office visits.   methocarbamol (ROBAXIN) 500 MG tablet Take 1 tablet (500 mg total) by mouth 2 (two) times daily. (Patient not taking: Reported on 08/08/2023)    Vibegron (GEMTESA) 75 MG TABS Take 1 tablet by mouth daily. (Patient not taking: Reported on 08/08/2023)    No facility-administered encounter medications on file as of 08/08/2023.    PAST MEDICAL HISTORY: Past Medical History:  Diagnosis Date   Diabetes mellitus without complication (HCC)    Hyperlipidemia    Hypertension     PAST SURGICAL HISTORY: Past Surgical History:  Procedure Laterality Date   CIRCUMCISION      ALLERGIES: No Known Allergies  FAMILY HISTORY: Family History  Problem Relation Age of Onset   Diabetes Mother    Diabetes Father    Esophageal cancer Maternal Uncle    Colon cancer Neg Hx    Colon polyps Neg Hx    Rectal cancer Neg Hx    Stomach cancer Neg Hx     SOCIAL HISTORY: Social History   Tobacco Use   Smoking status: Never   Smokeless tobacco: Never  Vaping Use   Vaping status: Never Used  Substance Use Topics   Alcohol use: No    Alcohol/week: 0.0 standard drinks of alcohol   Drug use: No   Social History   Social History Narrative   Are you right handed or left handed? Right   Are you currently employed ? no   What is your current occupation? Out of work   Do you live at home alone?   Who lives with you? family   What type of home do you live in: 1 story or 2 story? Two     Caffiene 1  weekly      Objective:  Vital Signs:  BP 119/74 (Cuff Size: Small)   Pulse 88   Ht 5\' 3"  (1.6 m)   Wt 149 lb (67.6 kg)   SpO2 98%   BMI 26.39 kg/m   General: No acute distress.  Patient appears well-groomed.   Head:  Normocephalic/atraumatic Neck: supple, positive for paraspinal tenderness particularly on left, reduced range of motion Heart:  Regular rate and rhythm Lungs:  Clear to auscultation bilaterally Neurological Exam: alert and oriented.  Speech fluent and not dysarthric, language intact.  CN II-XII intact. Bulk and tone normal, muscle strength 5/5 throughout.  Sensation to light  touch intact.  Deep tendon reflexes 2+ throughout, toes downgoing.  Finger to nose testing intact.  Gait normal, Romberg negative.   Labs and Imaging review: No new results  Previously reviewed results: Lab Results  Component Value Date    HGBA1C 6.8 (H) 02/15/2023      Recent Labs  No results found for: "VITAMINB12"   Recent Labs[] Expand by Default       Lab Results  Component Value Date    TSH 1.390 05/11/2019      02/21/23: CBC unremarkable BMP significant for Na 132, glucose 109   Imaging: CT head and cervical spine wo contrast (02/11/23): FINDINGS: CT HEAD FINDINGS   Brain: Scattered subarachnoid hemorrhage along the right sylvian fissure, bilateral subfrontal sulci, and anterior falx. Probable trace interhemispheric subdural hemorrhage along the falx near the vertex (axial image 26 series 2). Gray-white differentiation is preserved. No hydrocephalus, mass effect or midline shift.   Vascular: No hyperdense vessel or unexpected calcification.   Skull: Linear, nondisplaced fracture of the left occipital bone.   Sinuses/Orbits: No acute findings.   Other: None.   CT CERVICAL SPINE FINDINGS   Alignment: Normal.   Skull base and vertebrae: No acute cervical spine fracture. Craniocervical junction is intact.   Soft tissues and spinal canal: No prevertebral fluid or  swelling. No visible canal hematoma.   Disc levels: Multilevel cervical spondylosis, worst at C4-5, where there is at least mild spinal canal stenosis.   Upper chest: No acute findings.   Other: None.   IMPRESSION: 1. Scattered subarachnoid hemorrhage along the right Sylvian fissure, bilateral subfrontal sulci, and anterior falx. 2. Probable trace interhemispheric subdural hemorrhage along the falx near the vertex. 3. Linear, nondisplaced fracture of the left occipital bone. 4. No acute cervical spine fracture or traumatic listhesis.   CT head wo contrast (02/12/23): FINDINGS: Brain: Redemonstrated subarachnoid hemorrhage along the right sylvian fissure, in the right frontal sulci along the anterior falx, and along the inferior frontal lobes bilaterally. Small amount of subarachnoid hemorrhage in the right parietal sulci (series 4, image 22 in series 6, image 28) is new from prior exam.   No significant mass effect or midline shift. No hydrocephalus or mass. No evidence of acute infarct.   Vascular: No hyperdense vessel.   Skull: Redemonstrated nondisplaced fracture of the left occipital bone.   Sinuses/Orbits: No acute finding.   Other: None.   IMPRESSION: Redemonstrated subarachnoid hemorrhage along the right Sylvian fissure, right frontal sulci along the anterior falx, and along the inferior frontal lobes bilaterally. Small amount of subarachnoid hemorrhage in the right parietal sulci is new from prior exam. No significant mass effect or midline shift.   CT head wo contrast (02/15/23): FINDINGS: Brain: Interval resolution of the previously seen subarachnoid hemorrhage in the right sylvian fissure and along the right cerebral convexity. Small amount of residual subarachnoid blood products remains along the inferior medial left frontal lobe (series 5, image 53). Compared to prior exam there is a new 7 mm low-density subdural fluid collection along the left cerebral  convexity. No hydrocephalus. No extra-axial fluid collection. No CT evidence of an cortical infarct. No midline shift. No mass effect   Vascular: No hyperdense vessel or unexpected calcification.   Skull: Normal. Negative for fracture or focal lesion. 6 mm peripherally calcified lesion in the left face, favored to represent epidermal inclusion cyst.   Sinuses/Orbits: No middle ear or mastoid effusion. Paranasal sinuses are clear. Orbits are unremarkable.   Other: None.   IMPRESSION:  1. Compared to prior exam there is a new 7 mm low-density subdural fluid collection along the left cerebral convexity, which may represent a chronic subdural hematoma or hygroma. No midline shift. Recommend short term follow up head CT to ensure stability. 2. Interval resolution of the previously seen subarachnoid hemorrhage in the right Sylvian fissure and along the right cerebral convexity. Small amount of residual subarachnoid hemorrhage remains along the inferior medial left frontal lobe.   CT head wo contrast (02/16/23): FINDINGS: Brain: Low-density subdural collection along the left frontal convexity not seen on admission head CT, to 4 mm in thickness. Trace subarachnoid hemorrhage best seen at the right parasagittal parietal convexity, non progressed. No new hemorrhage or brain swelling. No hydrocephalus or shift   Vascular: No hyperdense vessel or unexpected calcification.   Skull: Nondepressed left occipital bone fracture, known.   Sinuses/Orbits: No acute finding.   IMPRESSION: 1. A small subdural hygroma along the left frontal convexity is non progressed. No significant mass effect. 2. No progression of trace remaining subarachnoid hemorrhage.   CT lumbar spine wo contrast (02/21/23): FINDINGS: Segmentation: 5 lumbar type vertebrae.   Alignment: There is mild anterolisthesis at L5-S1.   Vertebrae: No acute fracture or focal pathologic process.   Paraspinal and other soft  tissues: No acute abnormality. Aortic atherosclerosis.   Disc levels:   L1-L2: There are degenerative endplate changes and facet arthropathy with no significant spinal canal or neural foraminal stenosis.   L2-L3: There is disc bulge with degenerative endplate changes and facet arthropathy. No significant spinal canal or neural foraminal stenosis.   L3-L4: There is a disc bulge with degenerative endplate changes and facet arthropathy with no significant spinal canal or neural foraminal stenosis.   L4-L5: There is bulging of the disc with degenerative endplate changes and facet arthropathy resulting in mild spinal canal stenosis. The neural foramina are within normal limits.   L5-S1: There is a disc bulge with degenerative endplate changes and mild facet arthropathy. No significant spinal canal or neural foraminal stenosis.   IMPRESSION: 1. No acute fracture. 2. Multilevel degenerative disc disease, degenerative endplate changes, and facet arthropathy.  Assessment/Plan:  This is Alexander Parker, a 63 y.o. male with post concussive syndrome 2/2 fall at work c/b left occipital skull fracture, SDH, and SAH. He is recovering well with improvement in dizziness and headaches. He has ongoing neck pain and discomfort, but this is also improving. We discussed prognosis, which appears good.  Plan: -Meclizine 25 mg as needed for dizziness -Warning signs of repeat bleed discussed -Continue home PT exercises for neck pain  Return to clinic as needed  Jacquelyne Balint, MD

## 2023-08-08 ENCOUNTER — Ambulatory Visit: Admitting: Neurology

## 2023-08-08 ENCOUNTER — Encounter: Payer: Self-pay | Admitting: Neurology

## 2023-08-08 VITALS — BP 119/74 | HR 88 | Ht 63.0 in | Wt 149.0 lb

## 2023-08-08 DIAGNOSIS — R2689 Other abnormalities of gait and mobility: Secondary | ICD-10-CM

## 2023-08-08 DIAGNOSIS — G44309 Post-traumatic headache, unspecified, not intractable: Secondary | ICD-10-CM

## 2023-08-08 DIAGNOSIS — F0781 Postconcussional syndrome: Secondary | ICD-10-CM | POA: Diagnosis not present

## 2023-08-08 DIAGNOSIS — R42 Dizziness and giddiness: Secondary | ICD-10-CM | POA: Diagnosis not present

## 2023-08-08 DIAGNOSIS — I609 Nontraumatic subarachnoid hemorrhage, unspecified: Secondary | ICD-10-CM

## 2023-08-08 DIAGNOSIS — S065X1D Traumatic subdural hemorrhage with loss of consciousness of 30 minutes or less, subsequent encounter: Secondary | ICD-10-CM

## 2023-08-08 DIAGNOSIS — S065X1S Traumatic subdural hemorrhage with loss of consciousness of 30 minutes or less, sequela: Secondary | ICD-10-CM

## 2023-08-08 DIAGNOSIS — S02119S Unspecified fracture of occiput, sequela: Secondary | ICD-10-CM

## 2023-08-08 NOTE — Patient Instructions (Signed)
 You continue improve since your fall. I expect you to continue to improve over time. Continue your home exercises, ice, heat, and tylenol as needed for neck pain.  You can still take meclizine 25 mg as needed for dizziness.  If you have new difficulty speaking, face droop, numbness on one side of the body, weakness on one side of the body, or dizziness/imbalance, this could be the sign of a change and rebleeding in your head or stroke. Don't wait, please call EMS and be evaluated at the nearest emergency room.   Follow up with me as needed.  The physicians and staff at [facility] are committed to providing excellent care. You may receive a survey requesting feedback about your experience at our office. We strive to receive "very good" responses to the survey questions. If you feel that your experience would prevent you from giving the office a "very good " response, please contact our office to try to remedy the situation. We may be reached at [phone]. Thank you for taking the time out of your busy day to complete the survey.  Jacquelyne Balint, MD [facility]

## 2023-08-09 ENCOUNTER — Telehealth: Payer: Self-pay | Admitting: Neurology

## 2023-08-09 NOTE — Telephone Encounter (Signed)
 Pt came by today and needs a letter for work with his restrictions on it. Please call patient when it is ready for him to pick up

## 2023-08-13 NOTE — Telephone Encounter (Signed)
 Contacting office check status of the work note:call pt when completed

## 2023-08-14 ENCOUNTER — Telehealth: Payer: Self-pay

## 2023-08-14 ENCOUNTER — Encounter: Payer: Self-pay | Admitting: Neurology

## 2023-08-14 NOTE — Telephone Encounter (Signed)
 Called pt with interpretor # [name]. Told him the papers are at front desk. He said he would be here tomorrow to pick them up.

## 2023-08-19 ENCOUNTER — Encounter: Payer: Self-pay | Admitting: Neurology

## 2023-08-19 NOTE — Telephone Encounter (Signed)
 Pt came in with family member interpreting. He would like a letter for his work stating his limitations. He stated the paperwork he got on 08/14/23 was not what he was needing. I read them the letter from January and he stated he is wanting one for the most recent visit that might have any updates in any limitations.

## 2023-08-20 NOTE — Telephone Encounter (Signed)
 Called pt with interpreter and told him again that the letter Dr. Loleta Chance wrote him is still  with the same restrictions and does not need a new letter. If he would like to bring in a form from his employer so that doctor Hill can fill out that will be fine or if needed they can call us. He understood and will tell his employer.

## 2023-08-21 ENCOUNTER — Telehealth: Payer: Self-pay | Admitting: Neurology

## 2023-08-21 NOTE — Telephone Encounter (Signed)
 Marcie montoya from [employer] called and LM with AN. She needs an Nurse, adult note stating his work capacities. Please email letter to [id number]

## 2023-08-21 NOTE — Telephone Encounter (Signed)
 Called earlier and Left a message that we needed a release form for any records and that the pt has all the up dated records from [place] office. Also we can not email records as I gave her our fax number to fax the form to [place]. Waiting on forms.

## 2023-08-21 NOTE — Telephone Encounter (Signed)
 Alexander Parker called from [employer] again, she wants a call back [phone]

## 2023-08-28 NOTE — Telephone Encounter (Signed)
 Marcy from [employer] called asking for another note showing the same restrictions from [employer] on 08/08/23, if not the same please contact for explanation, note can be sent to [employer]

## 2023-08-28 NOTE — Telephone Encounter (Signed)
 Called office and left a message again that we can not send anything with out a release from pt. That he had all the papers that we would send them. Gave them our Fax number this is the second time that a massage was left for them.

## 2023-10-25 ENCOUNTER — Ambulatory Visit: Payer: BC Managed Care – PPO | Admitting: Neurology

## 2024-05-04 NOTE — Telephone Encounter (Signed)
 error

## 2024-06-06 ENCOUNTER — Encounter (HOSPITAL_COMMUNITY): Payer: Self-pay

## 2024-06-06 ENCOUNTER — Ambulatory Visit (HOSPITAL_COMMUNITY)
Admission: EM | Admit: 2024-06-06 | Discharge: 2024-06-06 | Disposition: A | Discharge status: Home / Self Care | Attending: Emergency Medicine | Admitting: Emergency Medicine

## 2024-06-06 DIAGNOSIS — J101 Influenza due to other identified influenza virus with other respiratory manifestations: Secondary | ICD-10-CM | POA: Diagnosis not present

## 2024-06-06 DIAGNOSIS — R051 Acute cough: Secondary | ICD-10-CM

## 2024-06-06 LAB — POC SOFIA SARS ANTIGEN FIA: SARS Coronavirus 2 Ag: NEGATIVE

## 2024-06-06 LAB — POCT INFLUENZA A/B
Influenza A, POC: NEGATIVE
Influenza B, POC: POSITIVE — AB

## 2024-06-06 MED ORDER — OSELTAMIVIR PHOSPHATE 75 MG PO CAPS: 75.0000 mg | ORAL_CAPSULE | Freq: Two times a day (BID) | ORAL | 0 refills | Status: AC

## 2024-06-06 MED ORDER — PROMETHAZINE-DM 6.25-15 MG/5ML PO SYRP: 5.0000 mL | ORAL_SOLUTION | Freq: Every evening | ORAL | 0 refills | Status: AC | PRN

## 2024-06-06 MED ORDER — BENZONATATE 100 MG PO CAPS: 100.0000 mg | ORAL_CAPSULE | Freq: Three times a day (TID) | ORAL | 0 refills | Status: AC

## 2024-06-06 NOTE — ED Provider Notes (Signed)
 " [facility]    CSN: [id number] Arrival date & time: 06/06/24  1039      History   Chief Complaint Chief Complaint  Patient presents with   Sore Throat   Cough    HPI Alexander Parker is a 64 y.o. male.   Patient presents with concerns for ongoing dry cough for approximately 3 weeks.  Patient states that he initially had a little bit of congestion and fatigue with a cough began and the symptoms subsided but he continued to have a dry cough.  Patient denies any chest pain, shortness of breath, fever, or weakness.  Patient denies taking medication for his cough.  Patient reports that about 2 days ago he began to have a mild sore throat, mild headache, fatigue, and nasal congestion with a cough continued.  Patient denies any known fever, chest pain, shortness of breath, nausea, vomiting, diarrhea, and abdominal pain.  Patient denies any known sick exposures.  Of note patient has a history of TBI, subdural hematoma, diabetes, hypertension, and hyperlipidemia.  The history is provided by the patient and medical records. The history is limited by a language barrier. A language interpreter was used (Spanish interpreter).  Sore Throat  Cough   Past Medical History:  Diagnosis Date   Diabetes mellitus without complication (HCC)    Hyperlipidemia    Hypertension     Patient Active Problem List   Diagnosis Date Noted   TBI (traumatic brain injury) (HCC) 02/16/2023   Subdural hematoma caused by concussion (HCC) 02/15/2023   Uncontrolled type 2 diabetes mellitus with hyperglycemia (HCC) 07/04/2018   Pure hypercholesterolemia 06/23/2018   HTN (hypertension) 12/23/2011   Diabetes mellitus (HCC) 07/29/2011    Past Surgical History:  Procedure Laterality Date   CIRCUMCISION         Home Medications    Prior to Admission medications  Medication Sig Start Date End Date Taking? Authorizing Provider  ACCU-CHEK GUIDE test strip USE UP TO 3 TIMES DAILY AS DIRECTED 07/06/20   Yes Tish Alm DEL, MD  Accu-Chek Softclix Lancets lancets USE UP TO 3 TIMES DAILY AS DIRECTED 07/06/20  Yes Tish Alm DEL, MD  acetaminophen  (TYLENOL ) 500 MG tablet Take 2 tablets (1,000 mg total) by mouth 3 (three) times daily. Patient taking differently: Take 1,000 mg by mouth as needed. 02/17/23  Yes Norrine Sharper, MD  atorvastatin  (LIPITOR) 40 MG tablet TAKE ONE TABLET BY MOUTH ONCE DAILY FOR CHOLESTEROL 05/26/20  Yes Just, Kelsea J, FNP  benzonatate  (TESSALON ) 100 MG capsule Take 1 capsule (100 mg total) by mouth every 8 (eight) hours. 06/06/24  Yes Johnie, Maelie Chriswell A, NP  glipiZIDE  (GLUCOTROL ) 10 MG tablet Take 1 tablet by mouth 2 (two) times daily. 04/02/23  Yes [provider]  Insulin  Pen Needle (PEN NEEDLES) 32G X 6 MM MISC 1 each by Does not apply route daily. 06/23/18  Yes Melonie Colonel, Mikel HERO, MD  JARDIANCE  25 MG TABS tablet Take 25 mg by mouth daily.   Yes [provider]  lisinopril  (ZESTRIL ) 20 MG tablet Take 1 tablet by mouth once daily 07/06/20  Yes Tish Alm DEL, MD  metFORMIN  (GLUCOPHAGE ) 1000 MG tablet TAKE 1 TABLET BY MOUTH TWICE DAILY WITH A MEAL Patient taking differently: Take 1,000 mg by mouth 2 (two) times daily with a meal. 03/01/20  Yes Melonie Colonel, Mikel HERO, MD  oseltamivir  (TAMIFLU ) 75 MG capsule Take 1 capsule (75 mg total) by mouth every 12 (twelve) hours. 06/06/24  Yes Johnie Flaming  A, NP  promethazine -dextromethorphan (PROMETHAZINE -DM) 6.25-15 MG/5ML syrup Take 5 mLs by mouth at bedtime as needed for cough. 06/06/24  Yes Johnie, Gredmarie Delange A, NP  tamsulosin  (FLOMAX ) 0.4 MG CAPS capsule Take 1 capsule (0.4 mg total) by mouth daily. 08/17/20  Yes Kip Ade, NP  Vibegron (GEMTESA) 75 MG TABS Take 1 tablet by mouth daily.   Yes [provider]  blood glucose meter kit and supplies KIT Per insurance preference. Use up to three times daily as directed. Dx E11.65, Z79.8. 05/26/20   Just, Kelsea J, FNP  meclizine  (ANTIVERT ) 25 MG tablet  Take 1 tablet (25 mg total) by mouth 3 (three) times daily as needed for dizziness. 06/14/23   Leigh Venetia CROME, MD  methocarbamol  (ROBAXIN ) 500 MG tablet Take 1 tablet (500 mg total) by mouth 2 (two) times daily. Patient not taking: Reported on 08/08/2023 03/07/23   Atway, Rayann N, DO    Family History Family History  Problem Relation Age of Onset   Diabetes Mother    Diabetes Father    Esophageal cancer Maternal Uncle    Colon cancer Neg Hx    Colon polyps Neg Hx    Rectal cancer Neg Hx    Stomach cancer Neg Hx     Social History Social History[1]   Allergies   Patient has no known allergies.   Review of Systems Review of Systems  Respiratory:  Positive for cough.    Per HPI  Physical Exam Triage Vital Signs ED Triage Vitals  Encounter Vitals Group     BP 06/06/24 1210 102/67     Girls Systolic BP Percentile --      Girls Diastolic BP Percentile --      Boys Systolic BP Percentile --      Boys Diastolic BP Percentile --      Pulse Rate 06/06/24 1210 67     Resp 06/06/24 1210 18     Temp 06/06/24 1210 98.5 F (36.9 C)     Temp Source 06/06/24 1210 Oral     SpO2 06/06/24 1210 94 %     Weight --      Height --      Head Circumference --      Peak Flow --      Pain Score 06/06/24 1212 0     Pain Loc --      Pain Education --      Exclude from Growth Chart --    No data found.  Updated Vital Signs BP 102/67 (BP Location: Left Arm)   Pulse 67   Temp 98.5 F (36.9 C) (Oral)   Resp 18   SpO2 94%   Visual Acuity Right Eye Distance:   Left Eye Distance:   Bilateral Distance:    Right Eye Near:   Left Eye Near:    Bilateral Near:     Physical Exam Vitals and nursing note reviewed.  Constitutional:      General: He is awake. He is not in acute distress.    Appearance: Normal appearance. He is well-developed and well-groomed. He is not ill-appearing.  HENT:     Right Ear: Tympanic membrane, ear canal and external ear normal.     Left Ear: Tympanic  membrane, ear canal and external ear normal.     Nose: Congestion and rhinorrhea present.     Mouth/Throat:     Mouth: Mucous membranes are moist.     Pharynx: Posterior oropharyngeal erythema and postnasal drip present. No oropharyngeal  exudate.     Tonsils: No tonsillar exudate.  Cardiovascular:     Rate and Rhythm: Normal rate and regular rhythm.  Pulmonary:     Effort: Pulmonary effort is normal.     Breath sounds: Normal breath sounds.  Skin:    General: Skin is warm and dry.  Neurological:     General: No focal deficit present.     Mental Status: He is alert and oriented to person, place, and time. Mental status is at baseline.  Psychiatric:        Behavior: Behavior is cooperative.      UC Treatments / Results  Labs (all labs ordered are listed, but only abnormal results are displayed) Labs Reviewed  POCT INFLUENZA A/B - Abnormal; Notable for the following components:      Result Value   Influenza B, POC Positive (*)    All other components within normal limits  POC SOFIA SARS ANTIGEN FIA    EKG   Radiology No results found.  Procedures Procedures (including critical care time)  Medications Ordered in UC Medications - No data to display  Initial Impression / Assessment and Plan / UC Course  I have reviewed the triage vital signs and the nursing notes.  Pertinent labs & imaging results that were available during my care of the patient were reviewed by me and considered in my medical decision making (see chart for details).     Patient is overall well-appearing.  Vitals are stable.  Tested positive for influenza B today.  Suspect this diagnosis is related to new onset of symptoms that began a couple days ago.  Suspect the ongoing cough is likely related to a previous viral illness that left patient with a lingering cough.  Prescribed Tamiflu  to cover for influenza B which I suspect began a couple days ago despite ongoing cough.  Prescribed Tessalon  and  Promethazine  DM as needed for cough.  Discussed over-the-counter medication as needed for symptoms.  Discussed follow-up and return precautions. Final Clinical Impressions(s) / UC Diagnoses   Final diagnoses:  Acute cough  Influenza B     Discharge Instructions      Hoy dio positivo en la prueba de gripe tipo B. Comience a tomar Tamiflu  dos veces al da durante [address]. Este es un medicamento antiviral para la gripe y no es una cura, pero puede minimizar la gravedad y la duracin de los sntomas. Puede causar nuseas, vmitos, diarrea y, en ocasiones, alucinaciones. Como comentamos, creo que su tos previa al desarrollo de los sntomas esta semana probablemente estaba relacionada con otra enfermedad viral que le caus una tos persistente. Le he recetado Tessalon , que puede tomar cada 8 horas segn sea necesario para la tos, as como jarabe para la tos Promethazine  DM, que puede tomar antes de acostarse segn sea necesario. Este medicamento puede causar somnolencia, por lo que no debe conducir, trabajar ni consumir alcohol mientras lo est tomando. Asegrese de mantenerse hidratado y descansar lo suficiente. Consulte con su mdico de cabecera o regrese a la clnica si es necesario.  You tested positive for flu B today.  Start taking Tamiflu  twice daily for 5 days.  This is an antiviral medication for the flu and is not meant to be a cure for the flu, but can minimize the severity and duration of your symptoms.  It can cause nausea, vomiting, diarrhea, and sometimes hallucinations. As discussed I believe your cough prior to developing the symptoms this week was likely related to another  viral illness that resulted in a lingering cough. I have prescribed Tessalon  that you can take every 8 hours as needed for cough.  As well as Promethazine  DM cough syrup that you can take at bedtime as needed for cough.  This can make you drowsy so do not drive, work, or drink alcohol while taking this. Make sure you  are staying hydrated getting plenty of rest. Follow-up with your primary care provider or return here as needed.   ED Prescriptions     Medication Sig Dispense Auth. Provider   oseltamivir  (TAMIFLU ) 75 MG capsule Take 1 capsule (75 mg total) by mouth every 12 (twelve) hours. 10 capsule Johnie Flaming A, NP   promethazine -dextromethorphan (PROMETHAZINE -DM) 6.25-15 MG/5ML syrup Take 5 mLs by mouth at bedtime as needed for cough. 118 mL Johnie Flaming A, NP   benzonatate  (TESSALON ) 100 MG capsule Take 1 capsule (100 mg total) by mouth every 8 (eight) hours. 21 capsule Johnie Flaming A, NP      PDMP not reviewed this encounter.    [1]  Social History Tobacco Use   Smoking status: Never   Smokeless tobacco: Never  Vaping Use   Vaping status: Never Used  Substance Use Topics   Alcohol use: No    Alcohol/week: 0.0 standard drinks of alcohol   Drug use: No     Johnie Flaming A, NP 06/06/24 1324  "

## 2024-06-06 NOTE — Discharge Instructions (Addendum)
 Hoy dio positivo en la prueba de gripe tipo B. Comience a tomar Tamiflu  dos veces al da durante [address]. Este es un medicamento antiviral para la gripe y no es una cura, pero puede minimizar la gravedad y la duracin de los sntomas. Puede causar nuseas, vmitos, diarrea y, en ocasiones, alucinaciones. Como comentamos, creo que su tos previa al desarrollo de los sntomas esta semana probablemente estaba relacionada con otra enfermedad viral que le caus una tos persistente. Le he recetado Tessalon , que puede tomar cada 8 horas segn sea necesario para la tos, as como jarabe para la tos Promethazine  DM, que puede tomar antes de acostarse segn sea necesario. Este medicamento puede causar somnolencia, por lo que no debe conducir, trabajar ni consumir alcohol mientras lo est tomando. Asegrese de mantenerse hidratado y descansar lo suficiente. Consulte con su mdico de cabecera o regrese a la clnica si es necesario.  You tested positive for flu B today.  Start taking Tamiflu  twice daily for 5 days.  This is an antiviral medication for the flu and is not meant to be a cure for the flu, but can minimize the severity and duration of your symptoms.  It can cause nausea, vomiting, diarrhea, and sometimes hallucinations. As discussed I believe your cough prior to developing the symptoms this week was likely related to another viral illness that resulted in a lingering cough. I have prescribed Tessalon  that you can take every 8 hours as needed for cough.  As well as Promethazine  DM cough syrup that you can take at bedtime as needed for cough.  This can make you drowsy so do not drive, work, or drink alcohol while taking this. Make sure you are staying hydrated getting plenty of rest. Follow-up with your primary care provider or return here as needed.

## 2024-06-06 NOTE — ED Triage Notes (Signed)
 Patient reports a dry cough x 3 weeks. Patient reports nasal congestion that started today. No medication have been taken.
